# Patient Record
Sex: Male | Born: 1955 | Race: White | Hispanic: No | Marital: Married | Smoking: Former smoker
Health system: Southern US, Community
[De-identification: ages and names within clinical notes are randomized; demographics above are authoritative.]

## PROBLEM LIST (undated history)

## (undated) DIAGNOSIS — M109 Gout, unspecified: Secondary | ICD-10-CM

## (undated) DIAGNOSIS — K429 Umbilical hernia without obstruction or gangrene: Secondary | ICD-10-CM

## (undated) DIAGNOSIS — E785 Hyperlipidemia, unspecified: Secondary | ICD-10-CM

## (undated) DIAGNOSIS — G039 Meningitis, unspecified: Secondary | ICD-10-CM

## (undated) DIAGNOSIS — D689 Coagulation defect, unspecified: Secondary | ICD-10-CM

## (undated) DIAGNOSIS — M1990 Unspecified osteoarthritis, unspecified site: Secondary | ICD-10-CM

## (undated) DIAGNOSIS — I2699 Other pulmonary embolism without acute cor pulmonale: Secondary | ICD-10-CM

## (undated) DIAGNOSIS — G5600 Carpal tunnel syndrome, unspecified upper limb: Secondary | ICD-10-CM

## (undated) DIAGNOSIS — E039 Hypothyroidism, unspecified: Secondary | ICD-10-CM

## (undated) DIAGNOSIS — K5790 Diverticulosis of intestine, part unspecified, without perforation or abscess without bleeding: Secondary | ICD-10-CM

## (undated) HISTORY — PX: BACK SURGERY: SHX140

## (undated) HISTORY — DX: Coagulation defect, unspecified: D68.9

## (undated) HISTORY — PX: COLONOSCOPY: SHX174

## (undated) HISTORY — PX: KNEE ARTHROSCOPY: SHX127

## (undated) HISTORY — DX: Carpal tunnel syndrome, unspecified upper limb: G56.00

---
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
## 2002-09-04 ENCOUNTER — Encounter: Payer: Self-pay | Admitting: Emergency Medicine

## 2002-09-04 ENCOUNTER — Inpatient Hospital Stay (HOSPITAL_COMMUNITY): Admission: EM | Admit: 2002-09-04 | Discharge: 2002-09-09 | Payer: Self-pay | Admitting: Emergency Medicine

## 2003-09-12 DIAGNOSIS — I2699 Other pulmonary embolism without acute cor pulmonale: Secondary | ICD-10-CM

## 2003-09-12 HISTORY — DX: Other pulmonary embolism without acute cor pulmonale: I26.99

## 2003-12-03 ENCOUNTER — Emergency Department (HOSPITAL_COMMUNITY): Admission: EM | Admit: 2003-12-03 | Discharge: 2003-12-04 | Payer: Self-pay | Admitting: Emergency Medicine

## 2004-09-11 HISTORY — PX: WRIST ARTHROSCOPY: SHX838

## 2005-02-09 ENCOUNTER — Ambulatory Visit (HOSPITAL_COMMUNITY): Admission: RE | Admit: 2005-02-09 | Discharge: 2005-02-09 | Payer: Self-pay | Admitting: Orthopedic Surgery

## 2005-02-09 ENCOUNTER — Ambulatory Visit (HOSPITAL_BASED_OUTPATIENT_CLINIC_OR_DEPARTMENT_OTHER): Admission: RE | Admit: 2005-02-09 | Discharge: 2005-02-09 | Payer: Self-pay | Admitting: Orthopedic Surgery

## 2005-09-11 HISTORY — PX: TOTAL KNEE ARTHROPLASTY: SHX125

## 2005-11-21 ENCOUNTER — Encounter: Admission: RE | Admit: 2005-11-21 | Discharge: 2005-11-21 | Payer: Self-pay | Admitting: Internal Medicine

## 2007-07-29 ENCOUNTER — Ambulatory Visit: Payer: Self-pay | Admitting: Internal Medicine

## 2007-08-06 ENCOUNTER — Ambulatory Visit: Payer: Self-pay | Admitting: Internal Medicine

## 2007-08-06 ENCOUNTER — Encounter: Payer: Self-pay | Admitting: Internal Medicine

## 2007-10-25 DIAGNOSIS — R197 Diarrhea, unspecified: Secondary | ICD-10-CM

## 2007-10-25 DIAGNOSIS — M109 Gout, unspecified: Secondary | ICD-10-CM

## 2007-10-25 DIAGNOSIS — Z8739 Personal history of other diseases of the musculoskeletal system and connective tissue: Secondary | ICD-10-CM | POA: Insufficient documentation

## 2007-10-25 DIAGNOSIS — Z86711 Personal history of pulmonary embolism: Secondary | ICD-10-CM

## 2009-02-10 ENCOUNTER — Ambulatory Visit (HOSPITAL_COMMUNITY): Admission: RE | Admit: 2009-02-10 | Discharge: 2009-02-10 | Payer: Self-pay | Admitting: Cardiovascular Disease

## 2009-09-11 HISTORY — PX: TOTAL KNEE REVISION: SHX996

## 2009-09-27 ENCOUNTER — Encounter (INDEPENDENT_AMBULATORY_CARE_PROVIDER_SITE_OTHER): Payer: Self-pay | Admitting: Orthopedic Surgery

## 2009-09-27 ENCOUNTER — Inpatient Hospital Stay (HOSPITAL_COMMUNITY): Admission: RE | Admit: 2009-09-27 | Discharge: 2009-10-01 | Payer: Self-pay | Admitting: Orthopedic Surgery

## 2009-11-26 ENCOUNTER — Encounter (INDEPENDENT_AMBULATORY_CARE_PROVIDER_SITE_OTHER): Payer: Self-pay | Admitting: Orthopedic Surgery

## 2009-11-26 ENCOUNTER — Inpatient Hospital Stay (HOSPITAL_COMMUNITY): Admission: AD | Admit: 2009-11-26 | Discharge: 2009-11-29 | Payer: Self-pay | Admitting: Orthopedic Surgery

## 2010-01-27 ENCOUNTER — Encounter: Admission: RE | Admit: 2010-01-27 | Discharge: 2010-04-12 | Payer: Self-pay | Admitting: Orthopedic Surgery

## 2010-09-11 HISTORY — PX: TOTAL HIP ARTHROPLASTY: SHX124

## 2010-11-27 LAB — TISSUE CULTURE
Culture: NO GROWTH
Gram Stain: NONE SEEN

## 2010-11-27 LAB — URINALYSIS, ROUTINE W REFLEX MICROSCOPIC
Bilirubin Urine: NEGATIVE
Glucose, UA: NEGATIVE mg/dL
Ketones, ur: NEGATIVE mg/dL
Leukocytes, UA: NEGATIVE
Protein, ur: NEGATIVE mg/dL
pH: 5 (ref 5.0–8.0)

## 2010-11-27 LAB — BASIC METABOLIC PANEL
BUN: 9 mg/dL (ref 6–23)
Calcium: 8.4 mg/dL (ref 8.4–10.5)
Calcium: 8.6 mg/dL (ref 8.4–10.5)
Creatinine, Ser: 1.11 mg/dL (ref 0.4–1.5)
GFR calc non Af Amer: >60 mL/min (ref >60)
GFR calc non Af Amer: >60 mL/min (ref >60)
Glucose, Bld: 147 mg/dL — ABNORMAL HIGH (ref 70–99)
Sodium: 135 mEq/L (ref 135–145)

## 2010-11-27 LAB — CBC
HCT: 42.2 % (ref 39.0–52.0)
Hemoglobin: 14.3 g/dL (ref 13.0–17.0)
Hemoglobin: 11.3 g/dL — ABNORMAL LOW (ref 13.0–17.0)
Hemoglobin: 12.4 g/dL — ABNORMAL LOW (ref 13.0–17.0)
MCHC: 33.8 g/dL (ref 30.0–36.0)
MCHC: 33.7 g/dL (ref 30.0–36.0)
MCHC: 33.8 g/dL (ref 30.0–36.0)
MCV: 84 fL (ref 78.0–100.0)
Platelets: 177 10*3/uL (ref 150–400)
Platelets: 183 10*3/uL (ref 150–400)
RBC: 5.08 MIL/uL (ref 4.22–5.81)
RDW: 14.1 % (ref 11.5–15.5)
RDW: 14.4 % (ref 11.5–15.5)
WBC: 8.7 10*3/uL (ref 4.0–10.5)

## 2010-11-27 LAB — PROTIME-INR
INR: 1.82 — ABNORMAL HIGH (ref 0.00–1.49)
INR: 1.02 (ref 0.00–1.49)
INR: 2.3 — ABNORMAL HIGH (ref 0.00–1.49)
INR: 2.63 — ABNORMAL HIGH (ref 0.00–1.49)
Prothrombin Time: 20.9 seconds — ABNORMAL HIGH (ref 11.6–15.2)
Prothrombin Time: 14.2 seconds (ref 11.6–15.2)
Prothrombin Time: 25.1 seconds — ABNORMAL HIGH (ref 11.6–15.2)

## 2010-11-27 LAB — APTT: aPTT: 26 seconds (ref 24–37)

## 2010-11-27 LAB — COMPREHENSIVE METABOLIC PANEL
ALT: 22 U/L (ref 0–53)
Alkaline Phosphatase: 114 U/L (ref 39–117)
BUN: 11 mg/dL (ref 6–23)
CO2: 28 mEq/L (ref 19–32)
Calcium: 9.4 mg/dL (ref 8.4–10.5)
GFR calc non Af Amer: >60 mL/min (ref >60)
Glucose, Bld: 96 mg/dL (ref 70–99)
Sodium: 142 mEq/L (ref 135–145)

## 2010-11-27 LAB — VANCOMYCIN, TROUGH: Vancomycin Tr: 13.8 ug/mL (ref 10.0–20.0)

## 2010-11-27 LAB — TYPE AND SCREEN: ABO/RH(D): O POS

## 2010-11-27 LAB — ANAEROBIC CULTURE

## 2010-11-27 LAB — BODY FLUID CULTURE

## 2010-11-27 LAB — GRAM STAIN

## 2010-12-01 ENCOUNTER — Other Ambulatory Visit (HOSPITAL_COMMUNITY): Payer: Self-pay | Admitting: Neurosurgery

## 2010-12-01 DIAGNOSIS — M545 Low back pain: Secondary | ICD-10-CM

## 2010-12-04 LAB — ANAEROBIC CULTURE: Gram Stain: NONE SEEN

## 2010-12-04 LAB — COMPREHENSIVE METABOLIC PANEL
ALT: 57 U/L — ABNORMAL HIGH (ref 0–53)
AST: 25 U/L (ref 0–37)
Albumin: 4.7 g/dL (ref 3.5–5.2)
Alkaline Phosphatase: 106 U/L (ref 39–117)
BUN: 14 mg/dL (ref 6–23)
CO2: 27 mEq/L (ref 19–32)
Calcium: 9.4 mg/dL (ref 8.4–10.5)
Chloride: 106 mEq/L (ref 96–112)
Creatinine, Ser: 0.99 mg/dL (ref 0.4–1.5)
GFR calc Af Amer: >60 mL/min (ref >60)
GFR calc non Af Amer: >60 mL/min (ref >60)
Glucose, Bld: 98 mg/dL (ref 70–99)
Potassium: 4.2 mEq/L (ref 3.5–5.1)
Sodium: 140 mEq/L (ref 135–145)
Total Bilirubin: 0.5 mg/dL (ref 0.3–1.2)
Total Protein: 8 g/dL (ref 6.0–8.3)

## 2010-12-04 LAB — PROTIME-INR
INR: 1.01 (ref 0.00–1.49)
INR: 1.16 (ref 0.00–1.49)
INR: 1.33 (ref 0.00–1.49)
INR: 1.91 — ABNORMAL HIGH (ref 0.00–1.49)
Prothrombin Time: 13.2 seconds (ref 11.6–15.2)
Prothrombin Time: 14.7 seconds (ref 11.6–15.2)
Prothrombin Time: 21.7 seconds — ABNORMAL HIGH (ref 11.6–15.2)

## 2010-12-04 LAB — CBC
HCT: 29.2 % — ABNORMAL LOW (ref 39.0–52.0)
HCT: 31.7 % — ABNORMAL LOW (ref 39.0–52.0)
HCT: 45.2 % (ref 39.0–52.0)
Hemoglobin: 10.5 g/dL — ABNORMAL LOW (ref 13.0–17.0)
Hemoglobin: 14.9 g/dL (ref 13.0–17.0)
Hemoglobin: 9.7 g/dL — ABNORMAL LOW (ref 13.0–17.0)
MCHC: 32.9 g/dL (ref 30.0–36.0)
MCHC: 33.1 g/dL (ref 30.0–36.0)
MCHC: 33.3 g/dL (ref 30.0–36.0)
MCHC: 33.7 g/dL (ref 30.0–36.0)
MCV: 86.1 fL (ref 78.0–100.0)
MCV: 86.3 fL (ref 78.0–100.0)
MCV: 86.7 fL (ref 78.0–100.0)
MCV: 87 fL (ref 78.0–100.0)
Platelets: 146 10*3/uL — ABNORMAL LOW (ref 150–400)
Platelets: 164 10*3/uL (ref 150–400)
Platelets: 222 10*3/uL (ref 150–400)
RBC: 3.64 MIL/uL — ABNORMAL LOW (ref 4.22–5.81)
RBC: 5.24 MIL/uL (ref 4.22–5.81)
RDW: 16.3 % — ABNORMAL HIGH (ref 11.5–15.5)
RDW: 16.4 % — ABNORMAL HIGH (ref 11.5–15.5)
RDW: 17 % — ABNORMAL HIGH (ref 11.5–15.5)
WBC: 8.2 10*3/uL (ref 4.0–10.5)

## 2010-12-04 LAB — URINALYSIS, ROUTINE W REFLEX MICROSCOPIC
Ketones, ur: NEGATIVE mg/dL
Nitrite: NEGATIVE
Specific Gravity, Urine: 1.021 (ref 1.005–1.030)
pH: 5 (ref 5.0–8.0)

## 2010-12-04 LAB — WOUND CULTURE
Culture: NO GROWTH
Gram Stain: NONE SEEN

## 2010-12-04 LAB — APTT
aPTT: 30 seconds (ref 24–37)
aPTT: 39 seconds — ABNORMAL HIGH (ref 24–37)

## 2010-12-04 LAB — GRAM STAIN: Gram Stain: NONE SEEN

## 2010-12-04 LAB — BASIC METABOLIC PANEL
BUN: 9 mg/dL (ref 6–23)
CO2: 28 mEq/L (ref 19–32)
CO2: 29 mEq/L (ref 19–32)
Calcium: 8.6 mg/dL (ref 8.4–10.5)
Chloride: 106 mEq/L (ref 96–112)
Chloride: 106 mEq/L (ref 96–112)
Creatinine, Ser: 1.02 mg/dL (ref 0.4–1.5)
Glucose, Bld: 107 mg/dL — ABNORMAL HIGH (ref 70–99)
Sodium: 139 mEq/L (ref 135–145)

## 2010-12-04 LAB — TYPE AND SCREEN: Antibody Screen: NEGATIVE

## 2010-12-04 LAB — GLUCOSE, CAPILLARY: Glucose-Capillary: 74 mg/dL (ref 70–99)

## 2010-12-06 ENCOUNTER — Ambulatory Visit (HOSPITAL_COMMUNITY)
Admission: RE | Admit: 2010-12-06 | Discharge: 2010-12-06 | Disposition: A | Discharge status: Home / Self Care | Payer: Commercial Managed Care - PPO | Source: Ambulatory Visit | Attending: Neurosurgery | Admitting: Neurosurgery

## 2010-12-06 DIAGNOSIS — M5137 Other intervertebral disc degeneration, lumbosacral region: Secondary | ICD-10-CM | POA: Insufficient documentation

## 2010-12-06 DIAGNOSIS — M7130 Other bursal cyst, unspecified site: Secondary | ICD-10-CM | POA: Insufficient documentation

## 2010-12-06 DIAGNOSIS — M51379 Other intervertebral disc degeneration, lumbosacral region without mention of lumbar back pain or lower extremity pain: Secondary | ICD-10-CM | POA: Insufficient documentation

## 2010-12-06 DIAGNOSIS — M545 Low back pain: Secondary | ICD-10-CM

## 2011-01-12 ENCOUNTER — Encounter (HOSPITAL_COMMUNITY)
Admission: RE | Admit: 2011-01-12 | Discharge: 2011-01-12 | Disposition: A | Discharge status: Home / Self Care | Payer: 59 | Source: Ambulatory Visit | Attending: Neurosurgery | Admitting: Neurosurgery

## 2011-01-12 LAB — CBC
HCT: 44 % (ref 39.0–52.0)
MCH: 29 pg (ref 26.0–34.0)
MCHC: 34.3 g/dL (ref 30.0–36.0)
MCV: 84.5 fL (ref 78.0–100.0)
Platelets: 217 10*3/uL (ref 150–400)
RDW: 13.9 % (ref 11.5–15.5)

## 2011-01-19 ENCOUNTER — Inpatient Hospital Stay (HOSPITAL_COMMUNITY): Payer: 59

## 2011-01-19 ENCOUNTER — Observation Stay (HOSPITAL_COMMUNITY)
Admission: RE | Admit: 2011-01-19 | Discharge: 2011-01-19 | Disposition: A | Discharge status: Home / Self Care | Payer: 59 | Source: Ambulatory Visit | Attending: Neurosurgery | Admitting: Neurosurgery

## 2011-01-19 DIAGNOSIS — M7130 Other bursal cyst, unspecified site: Secondary | ICD-10-CM | POA: Insufficient documentation

## 2011-01-19 DIAGNOSIS — Q762 Congenital spondylolisthesis: Principal | ICD-10-CM | POA: Insufficient documentation

## 2011-01-19 DIAGNOSIS — Z01812 Encounter for preprocedural laboratory examination: Secondary | ICD-10-CM | POA: Insufficient documentation

## 2011-01-19 DIAGNOSIS — M47817 Spondylosis without myelopathy or radiculopathy, lumbosacral region: Secondary | ICD-10-CM | POA: Insufficient documentation

## 2011-01-24 NOTE — Assessment & Plan Note (Signed)
[facility]                         GASTROENTEROLOGY OFFICE NOTE   NAME:Jason Norton, Jason Norton                        MRN:          [id number]  DATE:07/29/2007                            DOB:          February 20, 1956    REFERRING PHYSICIAN:  Gaspar Garbe, M.D.   REASON FOR CONSULTATION:  Screening colonoscopy in a 55 year old  gentleman on chronic Coumadin therapy.   HISTORY:  This is a 55 year old white male with a history of deep vein  thrombosis and pulmonary embolus for which he has been on Coumadin for  the past 4-1/2 years.  He also has a history of osteoarthritis and  underwent knee replacement surgery last year.  He presents now regarding  routine screening colonoscopy.  The patient denies any problems with  abdominal pain, nausea, vomiting, change in bowel habits, or bleeding.  He does state that he tends to have intermittent diarrhea chronically.  He cannot identify any exacerbating factors. His weight has been stable.  No prior history of GI problems or endoscopic evaluations.   PAST MEDICAL HISTORY:  1. 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  2. History of gout.  3. Degenerative arthritis status post right knee replacement in 2007.   ALLERGIES:  No known drug allergies.   CURRENT MEDICATIONS:  1. Allopurinol 100 mg daily.  2. Colchicine 0.6 mg daily.  3. Coumadin 5 mg daily.  4. Crestor 5 mg daily.  5. Propecia 1 mg daily.  6. Celebrex 200 mg daily.  7. Ambien 10 mg daily.   FAMILY HISTORY:  Unknown as patient is adopted.   SOCIAL HISTORY:  The patient is single without children.  He has a  bachelor's degree, works as a Curator for [employer].  He smokes a pipe, occasionally uses alcohol.   REVIEW OF SYSTEMS:  Per diagnostic evaluation form.   PHYSICAL EXAMINATION:  GENERAL: Well-appearing male in no acute  distress.  VITAL  SIGNS:  Blood pressure 120/82, heart rate 68, weight 197.6 pounds.  He is 5 feet 10 inches in height.  HEENT:  Sclerae anicteric.  Conjunctivae pink.  Oral mucosa intact, no  adenopathy.  LUNGS:  Clear.  HEART:  Regular.  ABDOMEN:  Soft without tenderness, mass, or hernia.  EXTREMITIES:  Without edema.   IMPRESSION:  This is a 55 year old gentleman who presents regarding  screening colonoscopy.  GI Review of Systems remarkable only for  chronic, stable, intermittent diarrhea. He is an appropriate candidate  without contraindication.  I discussed with him in detail the nature of  colonoscopy as well as its risks, benefits, and alternatives.  He  understood and agreed to proceed.  In terms  of management of his  anticoagulation, he has an appointment this week to see clinical  pharmacist Ronal Fear at Dr. Deneen Harts office regarding management of  his Coumadin periprocedurally (as was done for his previously knee  surgery).  We might anticipate that he will be off Coumadin with a  Lovenox bridge.  We will leave  this to Ms. Miller's discretion.  His  colonoscopy is tentatively planned for August 06, 2007, at 8 a.m.     Wilhemina Bonito. Marina Goodell, MD  Electronically Signed    JNP/MedQ  DD: 07/29/2007  DT: 07/29/2007  Job #: [id number]   cc:   Gaspar Garbe, M.D.

## 2011-01-24 NOTE — Op Note (Signed)
NAMEDEVONTRE, Jason Norton                 ACCOUNT NO.:  [id number]  MEDICAL RECORD NO.:  [id number]           PATIENT TYPE:  O  LOCATION:  3536                         FACILITY:  [facility]  PHYSICIAN:  Danae Orleans. Venetia Maxon, M.D.  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  PROCEDURES: 1. Right L4-5 laminectomy with resection of synovial cyst. 2. Microdissection.  SURGEON:  Danae Orleans. Venetia Maxon, MD  ASSISTANT:  Georgiann Cocker, RN and Clydene Fake, MD  ANESTHESIA:  General endotracheal anesthesia.  ESTIMATED BLOOD LOSS:  Minimal.  COMPLICATIONS:  None.  DISPOSITION:  Recovery.  INDICATIONS:  Jason Norton is a 54 year old man with mobile spondylolisthesis of L4 and L5 with a right-sided synovial cyst causing right L5 radiculopathy.  The patient elected to undergo resection of synovial cyst understanding that he may need a later fusion if he developed progressive instability.  PROCEDURE:  Jason Norton was brought to the operating room.  Following satisfactory and uncomplicated induction of general endotracheal anesthesia and placement of intravenous lines, the patient was placed in prone position on the Wilson frame.  His low back was prepped and draped in the usual sterile fashion.  The area of planned incision was infiltrated with local lidocaine.  Incision was made in the midline, carried to the lumbodorsal fascia, which was incised sharply on the right side of midline.  Subperiosteal dissection was performed exposing the L4-5 interspace.  Intraoperative x-ray demonstrated marker probes at the L4-5 and L5-S1 levels.  A hemi-semi-laminectomy of  L4 was performed with high-speed drill and completed with Kerrison rongeurs.  There were significant amount of degenerative changes in the facet joint.  The care was taken to preserve as much of the facet as possible.  A foraminotomy was performed overlying the L5 nerve root.  The hypertrophied ligamentum flavum was detached and removed in piecemeal fashion.  Under microscopic visualization, the synovial cyst was identified, this was causing significant L5 nerve root compression.  The synovial cyst was mobilized with microdissection and then removed in piecemeal fashion and the lateral dura was decompressed, the L5 nerve root was thoroughly decompressed and probe could easily be inserted.  Upon removal of the remainder of the cyst, probe could easily inserted along the course of the L4 nerve root.  Angled curettes were used to remove the lateral base of the cyst.  Hemostasis was assured.  The wound was irrigated with bacitracin saline, bathed in Depo-Medrol and fentanyl.  The self- retaining tractor was removed.  The lumbodorsal fascia was closed with 0 Vicryl sutures, subcutaneous tissues were reapproximated with 2-0 Vicryl and interrupted inverted sutures.  Skin edges were reapproximated with 3- 0 Vicryl subcuticular stitch.  The wound was dressed with Dermabond. The patient was extubated in the operating room and taken to the recovery in stable satisfactory having tolerated the  operation well. Counts were correct at the end of the case.     Danae Orleans. Venetia Maxon, M.D.     JDS/MEDQ  D:  01/19/2011  T:  01/19/2011  Job:  [id number]  Electronically Signed by Maeola Harman M.D. on 01/24/2011 07:44:25 AM

## 2011-01-27 NOTE — Op Note (Signed)
NAMEWISTER, HOEFLE                 ACCOUNT NO.:  [id number]   MEDICAL RECORD NO.:  [id number]          PATIENT TYPE:  AMB   LOCATION:  DSC                          FACILITY:  [facility]   PHYSICIAN:  Cindee Salt, M.D.       DATE OF BIRTH:  1956-08-22   DATE OF PROCEDURE:  02/09/2005  DATE OF DISCHARGE:                                 OPERATIVE REPORT   PREOPERATIVE DIAGNOSIS:  Gout, right wrist.   POSTOPERATIVE DIAGNOSIS:  Gout, right wrist.   OPERATION:  Arthroscopy, debridement and partial synovectomy of gout, right  wrist.   SURGEON:  Cindee Salt, M.D.   Threasa HeadsCarolyne Fiscal.   ANESTHESIA:  General.   HISTORY:  The patient is a 55 year old male with a history of gout. He has  had swelling of his wrist that has not responded to conservative treatment.  He is admitted for arthroscopy and debridement.   PROCEDURE:  The patient is brought to the operating room where a general  anesthetic was carried out without difficulty.  He was prepped using  DuraPrep in the supine position, right arm free. The limb was placed in the  arthroscopy tower and 10 pounds traction applied. The joint was aspirated;  this was sent for culture.  The joint was then inflated through the 3/4  portal. The scope was entered after making transverse incision through skin  only;  deployed a hemostat. A blunt trocar was used to enter the joint;  joint was inspected. Significant crystallization was present in the synovial  tissue. Am irrigation catheter was placed, and a small scapholunate ligament  tear was present. The 4/5 portal was opened, after localization with a 22  gauge needle; very significant synovitis was present across the entire  joint. The joint was then copiously irrigated with saline using a pump.  The  midcarpal joint was then inspected, after localization inflation with  saline. The mid radial portal was then entered.  The joint inspected; again,  a significant synovitis was present with tophaceous  crystals being present.  These were found to be extruding from the joint portals.  The area was then  copiously irrigated. The ulnar portal opened, after localization with 22  gauge needle. A separate portal was then placed for irrigation.  A shaver  and ArthroWand were then used perform a synovectomy at the mid carpal joint.  The proximal carpal joint was then inspected through both 4/5 portal, 3/4  portal; lunotriquetral joint ligament was intact. TFCC was intact. A  thorough debridement with the full-radius shaver and ArthroWand was then  performed to mid carpal joint, maintaining excellent flow.  The joint was  then irrigated.  The portals closed with interrupted 5-0 nylon sutures.  Sterile compressive dressing and wrist splint applied. The  patient tolerated the procedure well and was taken to the recovery room for  observation in satisfactory condition. He is discharged home, to return to  the [facility] in one week;  on Vicodin.  He will resume his  Coumadin. He has been on Coumadin with his INR being 1.5.  GK/MEDQ  D:  02/09/2005  T:  02/09/2005  Job:  [id number]

## 2011-01-27 NOTE — H&P (Signed)
NAME:  Jason Norton, WELLES NO.:  [id number]   MEDICAL RECORD NO.:  [id number]                   PATIENT TYPE:  INP   LOCATION:  0101                                 FACILITY:  [facility]   PHYSICIAN:  Gwen Pounds, M.D.                 DATE OF BIRTH:  1956/06/18   DATE OF ADMISSION:  09/04/2002  DATE OF DISCHARGE:                                HISTORY & PHYSICAL   CHIEF COMPLAINT:  Pulmonary emboli and chest pain.   HISTORY OF PRESENT ILLNESS:  The patient is a 55 year old white male,  otherwise healthy, who is here with his wife, visiting his mother for  Christmas from [place], [place]., where he just moved from [place]. He  presents with a four day history of chest tightness radiating to the back.  There is a pleuritic component noted. He also has a cough which is dry. No  hemoptysis. He has minor shortness of breath. He travels a lot, about 1,000  miles per week in the car. He is in an airplane about one to two times per  month. Diagnosed with pulmonary embolism in the emergency room. He is  otherwise comfortable right now. Heparin drip is on order. I was called for  admission.   PAST MEDICAL HISTORY:  Right knee reconstruction and three arthroscopic knee  surgeries in the last 2-3 years, the last one being 2-3 years ago. Also has  history of diverticulosis based on colonoscopy approximately five years ago.  He has a history of negative cardiac workup approximately seven years ago  secondary to atypical chest pain. Meningitis at nine weeks old and history  of gout.   ALLERGIES:  No known drug allergies.   MEDICATIONS:  1. Celebrex 200 mg twice a day.  2. Occasional aspirin.  3. Occasional NSAID.   SOCIAL HISTORY:  He moved to [place], [place]. from [place]. He works for  [employer] and [employer]. He is married. No children. He  smokes cigars, about three per day. Occasional alcohol use. No drugs.   FAMILY HISTORY:  He is  adopted.   REVIEW OF SYSTEMS:  He has epigastric discomfort without burning times two  weeks. He has gastroesophageal reflux disease type of symptoms. He has  occasional diarrhea with diverticulosis. Otherwise, no bowel or bladder  issues. He does not exercise. He has not had any recent weight loss,  unexplained fever, or chills. No major changes in his overall health. He  wears glasses. Otherwise, no current ear, nose, or throat type of symptoms.  No lower extremity issues except for his right knee. All systems were  reviewed and are negative.   PHYSICAL EXAMINATION:  VITAL SIGNS: Blood pressure 152/97, pulse stable at  80. He is 5'10 tall. He weighs 210 pounds. His respiratory rate is 20 and  he is 98% on room air.  GENERAL: Awake, alert, and oriented times three.  THROAT:  Negative.  HEENT: Pupils are equal, round, and reactive to light and accommodation.  Extraocular muscles intact. Oropharynx moist.  NECK: No jugular venous distention.. No lymphadenopathy.  PULMONARY: Clear to auscultation bilaterally.  CARDIAC: Regular rate and rhythm.  ABDOMEN: Soft and obese.  EXTREMITIES: No clubbing, cyanosis, or edema. No cords are noted. Right knee  with scars from surgical incision.  RECTAL: Heme negative. No nodules on the prostate.   LABORATORY DATA:  WBC count 6.6, hemoglobin 14.7, platelet count 233,  differential is within normal limits. B-met within normal limits except for  glucose of 148. Liver function studies are normal. CK is 79, troponin I  0.02.   DIAGNOSTIC IMPRESSION:  Chest x-ray is normal. Chest CT revealed left lower  lobe pulmonary embolism.   ASSESSMENT:  This is a 55 year old male otherwise healthy. He is with  pulmonary embolism but is hemodynamically stable.   PLAN:  1. Admit.  2. Heparin drip.  3. Coumadin. Start tomorrow.  4. Thrombophilia screen.  5. Oxygen.  6. Protonix for gastroesophageal reflux disease symptoms.  7. PSA but there is no obvious  malignancies noted.  8. Talk with the patient.  9. Doppler to rule out deep vein thrombosis.  10.      Likely etiology is obesity and immobility.  11.      Will check fasting sugar tomorrow. I believe that the hyperglycemia     is reactive.                                               Gwen Pounds, M.D.    JMR/MEDQ  D:  09/04/2002  T:  09/04/2002  Job:  [id number]

## 2011-01-27 NOTE — Discharge Summary (Signed)
NAME:  Jason Norton, Jason Norton NO.:  [id number]   MEDICAL RECORD NO.:  [id number]                   PATIENT TYPE:  INP   LOCATION:  5638                                 FACILITY:  [facility]   PHYSICIAN:  Gaspar Garbe, M.D.            DATE OF BIRTH:  14-Jan-1956   DATE OF ADMISSION:  09/04/2002  DATE OF DISCHARGE:  09/09/2002                                 DISCHARGE SUMMARY   DIAGNOSES:  1. Pulmonary embolus.  2. Deep venous thrombosis.  3. Gout flare.  4. Impaired glucose tolerance.   SUMMARY HOSPITAL COURSE:  The patient was admitted on September 04, 2002 with  diagnosis of pulmonary embolus.  He was started on IV heparin, which was  monitored through pharmacy protocol, and gradually weaned off of his oxygen  requirement.  He continued to do reasonably well and was started on Coumadin  per pharmacy protocol as well.  He initially had a slight cough and was  given Hytuss AC cough syrup for this but this was discontinued prior to the  time of his discharge.  The patient underwent screening for causes of DVT  including protein C, protein S, lupus anticoagulant and antithrombin III,  all of which were negative.  The patient has a factor V Leiden which is  currently pending.  A prothrombin mutation was supposed to have been ordered  but was not drawn and it will be required that he complete this in order to  have his hypercoagulable workup completed; this will be done by Dr. Gray Bernhardt  in [place], [place].  Once again, his lupus anticoagulant result  is negative, however, his factor V Leiden is still pending.  The patient  developed a gouty attack on the late evening of September 07, 2002 and he was  treated initially with one dose of Vioxx as well as Tylenol and colchicine.  He was continuing to have a slight flare on the morning of his discharge,  but had only taken three colchicines the prior day.  Steroids were offered  to the patient as an  outpatient but he refused at that point.   The patient was followed for his INR following his Coumadin start.  He was  initially started with 5 mg a day, 10 mg on day 2, 12.5 mg on day 3, and  then 7.5 mg on day 4 per pharmacy protocol.  His INR had gone from 1.9 on  September 08, 2002 to 2.9 on September 09, 2002.  The patient was then  therefore sent home on 5 mg of Coumadin, with followup to be drawn on  Friday, September 12, 2002, at Dr. Philpot's office for continued management.   It was also noted that the patient was having occasional elevated glucoses,  non-fasting, and an A1c was drawn, which was found to be 5.5, indicating  that the patient likely had impaired glucose tolerance.  He was seen by  dietary and diet and exercise were discussed with the patient as well.   MEDICATIONS:  1. Coumadin 5 mg p.o. every day, with INR to be drawn on September 12, 2002 by     Dr. Gray Bernhardt [place].  2. Colchicine 0.6 mg one tablet every two hours as needed for gout pain.     The patient may discontinue if develops diarrhea.   LABORATORY DATA ON DAY OF DISCHARGE:  White count 7.3, hemoglobin 14.3,  hematocrit 41.0, platelets 194,000.  PT of 26.2, INR of 2.9.  His PTT is 125  on IV heparin.  He also had a PSA done at the time of his admission which  was 0.78.  His protein C level was 78, protein S 67, lupus anticoagulant was  negative, antithrombin III 111 and as started prior, factor V Leiden is  currently pending.   CT done at the time of admission showed a filling defect through the  proximal left lower lobe pulmonary artery consistent with pulmonary embolus.   Lower extremity CAT scan showed a filling defect in the lower popliteal vein  suggestive of deep venous thrombosis of his left leg.   The patient also had negative cardiac enzymes at time of admission and he  was seen on telemetry during this time.  His admission glucose was 148.   FOLLOWUP:  The patient is to  follow up with Dr. Gray Bernhardt at an appointment  that he had scheduled for a regular physical in [place] on January 2nd  for continue anticoagulation monitoring and management.  In addition, I  would suggest that a prothrombin mutation 20210 be sent by Dr. Gray Bernhardt to  complete his hypercoagulable workup and that he call the laboratory here at  [facility], which is area code [phone], for results of the patient's  factor V Leiden, which is currently pending at this time, to help make  decision as to whether he will need long-term anticoagulation or lifetime  anticoagulation, based on these results.   CONDITION ON DISCHARGE:  Stable.                                               Gaspar Garbe, M.D.    RWT/MEDQ  D:  09/09/2002  T:  09/09/2002  Job:  [id number]   cc:   Jinny Blossom, [place] Dr. Gray Bernhardt

## 2011-03-17 ENCOUNTER — Other Ambulatory Visit: Payer: Self-pay | Admitting: Orthopedic Surgery

## 2011-03-17 ENCOUNTER — Ambulatory Visit (HOSPITAL_COMMUNITY)
Admission: RE | Admit: 2011-03-17 | Discharge: 2011-03-17 | Disposition: A | Discharge status: Home / Self Care | Payer: 59 | Source: Ambulatory Visit | Attending: Orthopedic Surgery | Admitting: Orthopedic Surgery

## 2011-03-17 ENCOUNTER — Encounter (HOSPITAL_COMMUNITY): Payer: 59

## 2011-03-17 ENCOUNTER — Other Ambulatory Visit (HOSPITAL_COMMUNITY): Payer: Self-pay | Admitting: Orthopedic Surgery

## 2011-03-17 DIAGNOSIS — M169 Osteoarthritis of hip, unspecified: Secondary | ICD-10-CM | POA: Insufficient documentation

## 2011-03-17 DIAGNOSIS — Z01811 Encounter for preprocedural respiratory examination: Secondary | ICD-10-CM | POA: Insufficient documentation

## 2011-03-17 DIAGNOSIS — Z01812 Encounter for preprocedural laboratory examination: Secondary | ICD-10-CM | POA: Insufficient documentation

## 2011-03-17 DIAGNOSIS — Z01818 Encounter for other preprocedural examination: Secondary | ICD-10-CM | POA: Insufficient documentation

## 2011-03-17 DIAGNOSIS — Z86718 Personal history of other venous thrombosis and embolism: Secondary | ICD-10-CM | POA: Insufficient documentation

## 2011-03-17 DIAGNOSIS — F17200 Nicotine dependence, unspecified, uncomplicated: Secondary | ICD-10-CM | POA: Insufficient documentation

## 2011-03-17 DIAGNOSIS — M1610 Unilateral primary osteoarthritis, unspecified hip: Secondary | ICD-10-CM | POA: Insufficient documentation

## 2011-03-17 LAB — URINALYSIS, ROUTINE W REFLEX MICROSCOPIC
Bilirubin Urine: NEGATIVE
Ketones, ur: NEGATIVE mg/dL
Leukocytes, UA: NEGATIVE
Nitrite: NEGATIVE
Specific Gravity, Urine: 1.024 (ref 1.005–1.030)
Urobilinogen, UA: 0.2 mg/dL (ref 0.0–1.0)
pH: 6 (ref 5.0–8.0)

## 2011-03-17 LAB — COMPREHENSIVE METABOLIC PANEL
AST: 15 U/L (ref 0–37)
Albumin: 4.6 g/dL (ref 3.5–5.2)
BUN: 15 mg/dL (ref 6–23)
Chloride: 103 mEq/L (ref 96–112)
Creatinine, Ser: 0.97 mg/dL (ref 0.50–1.35)
Potassium: 4 mEq/L (ref 3.5–5.1)
Total Bilirubin: 0.6 mg/dL (ref 0.3–1.2)
Total Protein: 7.4 g/dL (ref 6.0–8.3)

## 2011-03-17 LAB — CBC
HCT: 43.2 % (ref 39.0–52.0)
Hemoglobin: 14.5 g/dL (ref 13.0–17.0)
MCH: 28.7 pg (ref 26.0–34.0)
MCHC: 33.6 g/dL (ref 30.0–36.0)
RBC: 5.06 MIL/uL (ref 4.22–5.81)

## 2011-03-17 LAB — APTT: aPTT: 46 seconds — ABNORMAL HIGH (ref 24–37)

## 2011-03-17 LAB — PROTIME-INR: INR: 1.92 — ABNORMAL HIGH (ref 0.00–1.49)

## 2011-03-29 ENCOUNTER — Inpatient Hospital Stay (HOSPITAL_COMMUNITY)
Admission: RE | Admit: 2011-03-29 | Discharge: 2011-04-03 | DRG: 470 | Disposition: A | Discharge status: Home Health | Payer: 59 | Source: Ambulatory Visit | Attending: Orthopedic Surgery | Admitting: Orthopedic Surgery

## 2011-03-29 ENCOUNTER — Inpatient Hospital Stay (HOSPITAL_COMMUNITY): Payer: 59

## 2011-03-29 DIAGNOSIS — M169 Osteoarthritis of hip, unspecified: Principal | ICD-10-CM | POA: Diagnosis present

## 2011-03-29 DIAGNOSIS — M109 Gout, unspecified: Secondary | ICD-10-CM | POA: Diagnosis not present

## 2011-03-29 DIAGNOSIS — Z01812 Encounter for preprocedural laboratory examination: Secondary | ICD-10-CM

## 2011-03-29 DIAGNOSIS — E7800 Pure hypercholesterolemia, unspecified: Secondary | ICD-10-CM | POA: Diagnosis present

## 2011-03-29 DIAGNOSIS — Z7901 Long term (current) use of anticoagulants: Secondary | ICD-10-CM

## 2011-03-29 DIAGNOSIS — M1610 Unilateral primary osteoarthritis, unspecified hip: Principal | ICD-10-CM | POA: Diagnosis present

## 2011-03-29 DIAGNOSIS — Z96659 Presence of unspecified artificial knee joint: Secondary | ICD-10-CM

## 2011-03-29 DIAGNOSIS — I2782 Chronic pulmonary embolism: Secondary | ICD-10-CM | POA: Diagnosis present

## 2011-03-29 LAB — TYPE AND SCREEN
ABO/RH(D): O POS
Antibody Screen: NEGATIVE

## 2011-03-29 LAB — PROTIME-INR
INR: 1.01 (ref 0.00–1.49)
Prothrombin Time: 13.5 seconds (ref 11.6–15.2)

## 2011-03-29 LAB — APTT: aPTT: 36 seconds (ref 24–37)

## 2011-03-30 LAB — BASIC METABOLIC PANEL
BUN: 9 mg/dL (ref 6–23)
Calcium: 8.6 mg/dL (ref 8.4–10.5)
Chloride: 101 mEq/L (ref 96–112)
Creatinine, Ser: 0.98 mg/dL (ref 0.50–1.35)
GFR calc Af Amer: >60 mL/min (ref >60)
GFR calc non Af Amer: >60 mL/min (ref >60)

## 2011-03-30 LAB — PROTIME-INR
INR: 1.13 (ref 0.00–1.49)
Prothrombin Time: 14.7 seconds (ref 11.6–15.2)

## 2011-03-30 LAB — CBC
MCH: 28.6 pg (ref 26.0–34.0)
MCHC: 32.8 g/dL (ref 30.0–36.0)
Platelets: 140 10*3/uL — ABNORMAL LOW (ref 150–400)
RDW: 13.7 % (ref 11.5–15.5)

## 2011-03-31 LAB — CBC
HCT: 31.4 % — ABNORMAL LOW (ref 39.0–52.0)
MCHC: 33.4 g/dL (ref 30.0–36.0)
Platelets: 155 10*3/uL (ref 150–400)
RDW: 13.8 % (ref 11.5–15.5)
WBC: 9 10*3/uL (ref 4.0–10.5)

## 2011-03-31 LAB — PROTIME-INR: INR: 1.24 (ref 0.00–1.49)

## 2011-03-31 LAB — BASIC METABOLIC PANEL
CO2: 27 mEq/L (ref 19–32)
Calcium: 8.6 mg/dL (ref 8.4–10.5)
Creatinine, Ser: 0.91 mg/dL (ref 0.50–1.35)
GFR calc non Af Amer: >60 mL/min (ref >60)

## 2011-03-31 NOTE — H&P (Signed)
  NAMEDEJUAN, Jason NO.:  [id number]  MEDICAL RECORD NO.:  [id number]  LOCATION:                                 FACILITY:  PHYSICIAN:  Ollen Gross, M.D.    DATE OF BIRTH:  1956/08/18  DATE OF ADMISSION:  03/29/2011 DATE OF DISCHARGE:                             HISTORY & PHYSICAL   CHIEF COMPLAINT:  Left hip pain.  HISTORY OF PRESENT ILLNESS:  The patient is a 55 year old male well- known Dr. Ollen Gross, having previously undergone right knee surgery back in January of last year.  He is unfortunately going on and had problems with his left hip.  He has known end-stage arthritis by x-rays in the office, it has been progressively getting worse.  It is felt he would benefit from undergoing surgical intervention.  Risks and benefits have been discussed and now he presents for hip arthroplasty.  ALLERGIES:  No known drug allergies.  CURRENT MEDICATIONS:  Coumadin, finasteride, allopurinol, Colcrys,Ambien, Celebrex, Vicodin, Crestor.  PAST MEDICAL HISTORY: 1. History of pulmonary embolism approximately 7 years ago. 2. History of gout. 3. Hypercholesterolemia. 4. Osteoarthritis.  PAST SURGICAL HISTORY:  Right total knee replaced in 2007, right knee resection arthroplasty secondary to infection, right knee reimplantation in 2011, back surgery in May 2012 with a cyst removal laminectomy.  FAMILY HISTORY:  Adopted.  SOCIAL HISTORY:  Married, pipe smoker, seldom intake of alcohol, has 3 steps entering his home.  REVIEW OF SYSTEMS:  GENERAL:  No fever, chills, or night sweats. NEUROLOGIC:  No seizures, syncope, or paralysis.  RESPIRATORY:  No shortness of breath, productive cough, or hemoptysis.  CARDIOVASCULAR: No chest pain, orthopnea.  GI:  No nausea, vomiting, diarrhea, or constipation.  GU:  No dysuria, hematuria, or discharge. MUSCULOSKELETAL:  Hip pain.  PHYSICAL EXAMINATION:  VITAL SIGNS:  Pulse 72, respirations 14, blood pressure  128/78. GENERAL:  A 55 year old male well nourished, well developed, no acute distress.  He is alert, oriented, cooperative, excellent historian. HEENT:  Normocephalic, atraumatic.  Pupils round and reactive.  EOMs intact. NECK:  Supple. CHEST:  Clear. HEART:  Regular rate and rhythm without murmur, S1 and S2. ABDOMEN:  Soft, nontender.  Bowel sounds present. RECTAL/BREAST/GENITALIA:  Not done, not pertinent to present illness. EXTREMITIES:  Left hip, flexion 90, 0 internal rotation, 0 external rotation, 0 degrees of abduction.  IMPRESSION:  Osteoarthritis, left hip.  PLAN:  The patient will be admitted to [facility] to undergo a left total hip replacement arthroplasty.  Surgery will be performed by Dr. Ollen Gross.  He is going to stop his Coumadin 5 days prior to surgery and he will use a Lovenox bridge preoperatively.     Jason Norton, P.A.C.   ______________________________ Ollen Gross, M.D.    ALP/MEDQ  D:  03/26/2011  T:  03/27/2011  Job:  [id number]  cc:   Gaspar Garbe, M.D. Fax: [phone]  Electronically Signed by Patrica Duel P.A.C. on 03/29/2011 10:44:36 AM Electronically Signed by Ollen Gross M.D. on 03/31/2011 06:05:57 PM

## 2011-03-31 NOTE — Op Note (Signed)
NAMECARROLL, Norton NO.:  [id number]  MEDICAL RECORD NO.:  [id number]  LOCATION:  1603                         FACILITY:  [facility]  PHYSICIAN:  Jason Norton, M.D.    DATE OF BIRTH:  Jul 09, 1956  DATE OF PROCEDURE:  03/29/2011 DATE OF DISCHARGE:                              OPERATIVE REPORT   PREOPERATIVE DIAGNOSIS:  Osteoarthritis, left hip.  POSTOPERATIVE DIAGNOSIS:  Osteoarthritis, left hip.  PROCEDURE:  Left total hip arthroplasty.  SURGEON:  Jason Norton, M.D.  ASSISTANT:  Jason Norton, P.A.C.  ANESTHESIA:  General.  ESTIMATED BLOOD LOSS:  500.[name]  DRAIN:  Hemovac x1.  COMPLICATIONS:  None.  CONDITION.:  Stable to recovery area.  BRIEF CLINICAL NOTE:  Jason Norton is a 55 year old male with advanced end- stage arthritis of the left hip with progressively worsening pain and dysfunction.  He has failed nonoperative management and presents now for left total hip arthroplasty.  PROCEDURE IN DETAIL:  After successful administration of general anesthetic, the patient was placed in the right lateral decubitus position with left side up and held with the hip positioner.  The left lower extremity was isolated from his perineum with plastic drapes and prepped and draped in usual sterile fashion.  Short posterolateral incision was made with 10 blade through subcutaneous tissue to the level of fascia lata which was incised in line with the skin incision. Sciatic nerve was palpated and protected and short rotators and capsule were isolated off the femur.  Hip was dislocated and the center of femoral head was marked.  A trial prosthesis was placed such that the center of the trial head corresponds to the center of native femoral head.  Osteotomy line was marked on the femoral neck and osteotomy made with an oscillating saw.  Femoral head was then removed.  Femoral retractors were placed to gain exposure to the proximal femur. The starter reamer was  then passed into the canal and canal was thoroughly irrigated with saline solution to remove fatty contents. Axial reaming was performed to 13.5 mm, proximal reaming to a 18D and the sleeve machined to a small.  An 18D small trial sleeve was placed.  The femur was retracted anteriorly to gain acetabular exposure. Acetabular retractors were placed and labrum and osteophytes removed. Acetabular reaming was performed up to 55 mm and a 56-mm pinnacle acetabular shell was impacted in anatomic position.  It had excellent purchase, we did not put any additional dome screws.  Apex hole eliminator was placed and 36 mm neutral +4 marathon liner was placed. The trial stem was then placed which was a 18 x 30 with a 36 +12 neck matching native anteversion.  The 36 +0 head was placed.  It reduces fairly easily, so went to +3 which had more appropriate soft tissue tension.  There was great stability with full extension, full external rotation, 70 degrees of flexion, 40 degrees of adduction, 90 degrees of internal rotation and 90 degrees of flexion and 70 degrees of internal rotation.  By placing the left leg on top of the right, it felt as though the leg lengths were equal.  The hip was then dislocated and all trials  were removed.  The permanent 18D small sleeve was placed with 18 x 13 stem and 36 +12 neck matching native anteversion.  The 36 +3 ceramic head was placed and the hip reduced with the same stability parameters.  The wound was copiously irrigated saline solution and short rotators and capsule reattached to the femur through drill holes with Ethibond suture.  Fascia lata was closed over Hemovac drain with interrupted #1 Vicryl, subcu closed #1 and #2-0 Vicryl and subcuticular running 4-0 Monocryl.  Catheter for Marcaine pain pump was placed and pump initiated.  Incisions cleaned and dried and Steri-Strips and bulky sterile dressing were applied.  He was then placed into a knee immobilizer,  awakened and transferred to recovery in stable condition.     Jason Norton, M.D.     FA/MEDQ  D:  03/29/2011  T:  03/29/2011  Job:  [id number]  Electronically Signed by Jason Norton M.D. on 03/31/2011 06:06:00 PM

## 2011-04-01 LAB — CBC
MCH: 28.4 pg (ref 26.0–34.0)
MCV: 87.5 fL (ref 78.0–100.0)
Platelets: 145 10*3/uL — ABNORMAL LOW (ref 150–400)
RDW: 13.7 % (ref 11.5–15.5)
WBC: 8.8 10*3/uL (ref 4.0–10.5)

## 2011-04-01 LAB — PROTIME-INR: Prothrombin Time: 18.4 seconds — ABNORMAL HIGH (ref 11.6–15.2)

## 2011-04-02 LAB — PROTIME-INR: Prothrombin Time: 21 seconds — ABNORMAL HIGH (ref 11.6–15.2)

## 2011-04-24 NOTE — Discharge Summary (Signed)
Jason Norton, Jason Norton NO.:  [id number]  MEDICAL RECORD NO.:  [id number]  LOCATION:  1603                         FACILITY:  [facility]  PHYSICIAN:  Ollen Gross, M.D.    DATE OF BIRTH:  June 27, 1956  DATE OF ADMISSION:  03/29/2011 DATE OF DISCHARGE:  04/03/2011                              DISCHARGE SUMMARY   ADMITTING DIAGNOSES: 1. Osteoarthritis, left hip. 2. Past history of pulmonary embolism. 3. History of gout. 4. Hypercholesterolemia. 5. Osteoarthritis.  DISCHARGE DIAGNOSES: 1. 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 2. Left knee effusion, status post bedside aspiration. 3. Past history of pulmonary embolism. 4. History of gout. 5. Hypercholesterolemia. 6. Osteoarthritis.  PROCEDURE:  On March 29, 2011, left total hip.  Surgeon, Dr. Lequita Halt. Assistant, Patrica Duel, PA-C.  Anesthesia was general.  CONSULTS:  None.  BRIEF HISTORY:  The patient is a 55 year old male with advanced arthritis of left hip, progressive worsening pain and dysfunction, failed nonoperative management and now presents for total hip arthroplasty.  LABORATORY DATA:  Admission CBC is not scanned into the chart, but serial CBCs were followed for 3 days.  Hemoglobin was down to 10.7 more stabilized.  Last done hemoglobin and hematocrit stable at 10.2 and 31.4.  PT/INR on admission 13.5 and 1.01 with a PTT of 36.  Serial prothrombin times followed per Coumadin protocol.  Last done prothrombin time INR was 24.4 and 2.15.  Chem panel on admission not scanned in the chart, but BMET's for 48 hours were all within normal limits.  Blood group type O positive.  X-RAYS:  Hip film and pelvis film shows satisfactory position of left total hip.  HOSPITAL COURSE:  The patient was admitted to [facility], taken to OR, underwent above-stated procedure without complication.  The patient tolerated procedure well, later transferred to  recovery room and then to orthopedic floor.  He was placed on a Lovenox bridge along with Coumadin due to his history of PE.  He was given p.o. and IV analgesics, 24 hours postop IV antibiotics started up with therapy, partial weightbearing 25% to 50%.  He progressed with physical therapy.  By day #2, he was doing a little bit better.  Hip was sore, but hemoglobin was stable.  Dressing changed, incision looked good.  Day #3, he continued to progress well.  This was on Saturday and not met all of his goals yet and by Sunday, however, he developed a knee effusion and the left knee had swelled up, requiring a bedside procedure by Dr. Lequita Halt.  He underwent aspiration of about 40 cc of clear fluid and then injected with lidocaine and Depo-Medrol.  Discharge was held until the following day of postop day #5.  He was doing better, the knee was improved,  the incision was healing well, hip doing well, and he was discharged home.  DISCHARGE PLAN: 1. The patient discharged home on April 03, 2011. 2. Discharge diagnoses, please see above. 3. Discharge medications, Robaxin, OxyIR, Coumadin.  Continue     allopurinol, Celebrex, Colcrys, Crestor, cyclobenzaprine, Propecia,     and Ambien. 4. Diet, heart-healthy diet. 5. Activity, partial weightbearing  25% to 50%.  Hip precautions, total     hip protocol. 6. Follow up in 2 weeks.  DISPOSITION:  Home.  CONDITION UPON DISCHARGE:  Improved.     [name], P.A.C.   ______________________________ Ollen Gross, M.D.    ALP/MEDQ  D:  04/20/2011  T:  04/21/2011  Job:  [id number]  cc:   Gaspar Garbe, M.D. Fax: [phone]  Electronically Signed by Ollen Gross M.D. on 04/24/2011 11:35:28 AM

## 2012-07-18 ENCOUNTER — Other Ambulatory Visit (HOSPITAL_COMMUNITY): Payer: Self-pay | Admitting: Neurosurgery

## 2012-07-18 DIAGNOSIS — M542 Cervicalgia: Secondary | ICD-10-CM

## 2012-07-19 ENCOUNTER — Encounter: Payer: Self-pay | Admitting: Internal Medicine

## 2012-07-21 ENCOUNTER — Ambulatory Visit (HOSPITAL_COMMUNITY)
Admission: RE | Admit: 2012-07-21 | Discharge: 2012-07-21 | Disposition: A | Discharge status: Home / Self Care | Payer: 59 | Source: Ambulatory Visit | Attending: Neurosurgery | Admitting: Neurosurgery

## 2012-07-21 DIAGNOSIS — M542 Cervicalgia: Secondary | ICD-10-CM | POA: Insufficient documentation

## 2012-07-21 DIAGNOSIS — M25519 Pain in unspecified shoulder: Secondary | ICD-10-CM | POA: Insufficient documentation

## 2012-07-21 DIAGNOSIS — M67919 Unspecified disorder of synovium and tendon, unspecified shoulder: Secondary | ICD-10-CM | POA: Insufficient documentation

## 2012-07-21 DIAGNOSIS — M719 Bursopathy, unspecified: Secondary | ICD-10-CM | POA: Insufficient documentation

## 2012-07-21 DIAGNOSIS — S46919A Strain of unspecified muscle, fascia and tendon at shoulder and upper arm level, unspecified arm, initial encounter: Secondary | ICD-10-CM | POA: Insufficient documentation

## 2012-07-21 DIAGNOSIS — X58XXXA Exposure to other specified factors, initial encounter: Secondary | ICD-10-CM | POA: Insufficient documentation

## 2012-08-23 ENCOUNTER — Encounter (HOSPITAL_BASED_OUTPATIENT_CLINIC_OR_DEPARTMENT_OTHER): Payer: Self-pay | Admitting: *Deleted

## 2012-08-23 NOTE — Progress Notes (Signed)
On coumadin for hx PE-off to come in for pt ptt-hx gout on allopurinol-will get bmet

## 2012-08-26 ENCOUNTER — Encounter (HOSPITAL_BASED_OUTPATIENT_CLINIC_OR_DEPARTMENT_OTHER)
Admission: RE | Admit: 2012-08-26 | Discharge: 2012-08-26 | Disposition: A | Discharge status: Home / Self Care | Payer: 59 | Source: Ambulatory Visit | Attending: Orthopedic Surgery | Admitting: Orthopedic Surgery

## 2012-08-26 LAB — BASIC METABOLIC PANEL
CO2: 26 mEq/L (ref 19–32)
Chloride: 102 mEq/L (ref 96–112)
Creatinine, Ser: 0.92 mg/dL (ref 0.50–1.35)
GFR calc Af Amer: >90 mL/min (ref >90)
Potassium: 4.5 mEq/L (ref 3.5–5.1)

## 2012-08-26 LAB — PROTIME-INR
INR: 1.1 (ref 0.00–1.49)
Prothrombin Time: 14.1 seconds (ref 11.6–15.2)

## 2012-08-26 LAB — APTT: aPTT: 41 seconds — ABNORMAL HIGH (ref 24–37)

## 2012-08-27 ENCOUNTER — Ambulatory Visit (HOSPITAL_BASED_OUTPATIENT_CLINIC_OR_DEPARTMENT_OTHER): Payer: 59 | Admitting: Anesthesiology

## 2012-08-27 ENCOUNTER — Encounter (HOSPITAL_BASED_OUTPATIENT_CLINIC_OR_DEPARTMENT_OTHER): Payer: Self-pay | Admitting: Anesthesiology

## 2012-08-27 ENCOUNTER — Encounter (HOSPITAL_BASED_OUTPATIENT_CLINIC_OR_DEPARTMENT_OTHER)
Admission: RE | Disposition: A | Discharge status: Home / Self Care | Payer: Self-pay | Source: Ambulatory Visit | Attending: Orthopedic Surgery

## 2012-08-27 ENCOUNTER — Encounter (HOSPITAL_BASED_OUTPATIENT_CLINIC_OR_DEPARTMENT_OTHER): Payer: Self-pay | Admitting: *Deleted

## 2012-08-27 ENCOUNTER — Ambulatory Visit (HOSPITAL_BASED_OUTPATIENT_CLINIC_OR_DEPARTMENT_OTHER)
Admission: RE | Admit: 2012-08-27 | Discharge: 2012-08-27 | Disposition: A | Discharge status: Home / Self Care | Payer: 59 | Source: Ambulatory Visit | Attending: Orthopedic Surgery | Admitting: Orthopedic Surgery

## 2012-08-27 DIAGNOSIS — Z96659 Presence of unspecified artificial knee joint: Secondary | ICD-10-CM | POA: Insufficient documentation

## 2012-08-27 DIAGNOSIS — Z86711 Personal history of pulmonary embolism: Secondary | ICD-10-CM | POA: Insufficient documentation

## 2012-08-27 DIAGNOSIS — Z96649 Presence of unspecified artificial hip joint: Secondary | ICD-10-CM | POA: Insufficient documentation

## 2012-08-27 DIAGNOSIS — Z7901 Long term (current) use of anticoagulants: Secondary | ICD-10-CM | POA: Insufficient documentation

## 2012-08-27 DIAGNOSIS — M109 Gout, unspecified: Secondary | ICD-10-CM | POA: Insufficient documentation

## 2012-08-27 DIAGNOSIS — Z01812 Encounter for preprocedural laboratory examination: Secondary | ICD-10-CM | POA: Insufficient documentation

## 2012-08-27 DIAGNOSIS — M1990 Unspecified osteoarthritis, unspecified site: Secondary | ICD-10-CM | POA: Insufficient documentation

## 2012-08-27 DIAGNOSIS — E785 Hyperlipidemia, unspecified: Secondary | ICD-10-CM | POA: Insufficient documentation

## 2012-08-27 DIAGNOSIS — G5600 Carpal tunnel syndrome, unspecified upper limb: Secondary | ICD-10-CM | POA: Insufficient documentation

## 2012-08-27 HISTORY — DX: Unspecified osteoarthritis, unspecified site: M19.90

## 2012-08-27 HISTORY — DX: Gout, unspecified: M10.9

## 2012-08-27 HISTORY — PX: STERIOD INJECTION: SHX5046

## 2012-08-27 HISTORY — DX: Hyperlipidemia, unspecified: E78.5

## 2012-08-27 HISTORY — DX: Meningitis, unspecified: G03.9

## 2012-08-27 HISTORY — PX: CARPAL TUNNEL RELEASE: SHX101

## 2012-08-27 HISTORY — DX: Other pulmonary embolism without acute cor pulmonale: I26.99

## 2012-08-27 SURGERY — CARPAL TUNNEL RELEASE
Anesthesia: General | Site: Wrist | Laterality: Right | Wound class: Clean

## 2012-08-27 MED ORDER — HYDROCODONE-ACETAMINOPHEN 5-500 MG PO TABS: 1.0000 | ORAL_TABLET | ORAL | Status: DC | PRN

## 2012-08-27 MED ORDER — BETAMETHASONE SOD PHOS & ACET 6 (3-3) MG/ML IJ SUSP
INTRAMUSCULAR | Status: DC | PRN
  Administered 2012-08-27: 1 mL

## 2012-08-27 MED ORDER — LIDOCAINE HCL (PF) 1 % IJ SOLN
INTRAMUSCULAR | Status: DC | PRN
  Administered 2012-08-27: 1 mL

## 2012-08-27 MED ORDER — LACTATED RINGERS IV SOLN
INTRAVENOUS | Status: DC
  Administered 2012-08-27 (×2): via INTRAVENOUS

## 2012-08-27 MED ORDER — OXYCODONE HCL 5 MG/5ML PO SOLN: 5.0000 mg | Freq: Once | ORAL | Status: AC | PRN

## 2012-08-27 MED ORDER — BUPIVACAINE HCL 0.25 % IJ SOLN
INTRAMUSCULAR | Status: DC | PRN
  Administered 2012-08-27: 17:00:00

## 2012-08-27 MED ORDER — OXYCODONE HCL 5 MG PO TABS
5.0000 mg | ORAL_TABLET | Freq: Once | ORAL | Status: AC | PRN
  Administered 2012-08-27: 5 mg via ORAL

## 2012-08-27 MED ORDER — ONDANSETRON HCL 4 MG/2ML IJ SOLN
INTRAMUSCULAR | Status: DC | PRN
  Administered 2012-08-27: 4 mg via INTRAVENOUS

## 2012-08-27 MED ORDER — BUPIVACAINE HCL (PF) 0.25 % IJ SOLN
INTRAMUSCULAR | Status: DC | PRN
  Administered 2012-08-27: 1 mL

## 2012-08-27 MED ORDER — MIDAZOLAM HCL 5 MG/5ML IJ SOLN
INTRAMUSCULAR | Status: DC | PRN
  Administered 2012-08-27 (×2): 1 mg via INTRAVENOUS

## 2012-08-27 MED ORDER — ONDANSETRON HCL 4 MG/2ML IJ SOLN: 4.0000 mg | Freq: Once | INTRAMUSCULAR | Status: DC | PRN

## 2012-08-27 MED ORDER — HYDROMORPHONE HCL PF 1 MG/ML IJ SOLN: 0.2500 mg | INTRAMUSCULAR | Status: DC | PRN

## 2012-08-27 MED ORDER — FENTANYL CITRATE 0.05 MG/ML IJ SOLN
INTRAMUSCULAR | Status: DC | PRN
  Administered 2012-08-27 (×2): 50 ug via INTRAVENOUS

## 2012-08-27 MED ORDER — CEFAZOLIN SODIUM-DEXTROSE 2-3 GM-% IV SOLR
INTRAVENOUS | Status: DC | PRN
  Administered 2012-08-27: 2 g via INTRAVENOUS

## 2012-08-27 SURGICAL SUPPLY — 51 items
BANDAGE CONFORM 3  STR LF (GAUZE/BANDAGES/DRESSINGS) ×3 IMPLANT
BANDAGE ELASTIC 3 VELCRO ST LF (GAUZE/BANDAGES/DRESSINGS) ×3 IMPLANT
BLADE CARPAL TUNNEL SNGL USE (BLADE) ×3 IMPLANT
BLADE SURG 15 STRL LF DISP TIS (BLADE) ×4 IMPLANT
BLADE SURG 15 STRL SS (BLADE) ×2
BNDG PLASTER X FAST 3X3 WHT LF (CAST SUPPLIES) IMPLANT
BRUSH SCRUB EZ PLAIN DRY (MISCELLANEOUS) ×3 IMPLANT
CLOTH BEACON ORANGE TIMEOUT ST (SAFETY) ×3 IMPLANT
CORDS BIPOLAR (ELECTRODE) ×3 IMPLANT
COVER MAYO STAND STRL (DRAPES) ×3 IMPLANT
COVER TABLE BACK 60X90 (DRAPES) ×3 IMPLANT
CUFF TOURNIQUET SINGLE 18IN (TOURNIQUET CUFF) ×3 IMPLANT
DRAIN PENROSE 1/4X12 LTX STRL (WOUND CARE) IMPLANT
DRAPE EXTREMITY T 121X128X90 (DRAPE) ×3 IMPLANT
DRAPE SURG 17X23 STRL (DRAPES) ×3 IMPLANT
DRSG EMULSION OIL 3X3 NADH (GAUZE/BANDAGES/DRESSINGS) ×3 IMPLANT
GAUZE SPONGE 4X4 16PLY XRAY LF (GAUZE/BANDAGES/DRESSINGS) IMPLANT
GLOVE BIO SURGEON STRL SZ 6.5 (GLOVE) ×3 IMPLANT
GLOVE BIO SURGEON STRL SZ7.5 (GLOVE) ×3 IMPLANT
GLOVE BIOGEL M STRL SZ7.5 (GLOVE) ×3 IMPLANT
GLOVE BIOGEL PI IND STRL 7.0 (GLOVE) ×2 IMPLANT
GLOVE BIOGEL PI IND STRL 8 (GLOVE) ×2 IMPLANT
GLOVE BIOGEL PI INDICATOR 7.0 (GLOVE) ×1
GLOVE BIOGEL PI INDICATOR 8 (GLOVE) ×1
GLOVE ECLIPSE 6.5 STRL STRAW (GLOVE) ×3 IMPLANT
GLOVE SS BIOGEL STRL SZ 8 (GLOVE) ×2 IMPLANT
GLOVE SUPERSENSE BIOGEL SZ 8 (GLOVE) ×1
GOWN PREVENTION PLUS XLARGE (GOWN DISPOSABLE) ×3 IMPLANT
GOWN PREVENTION PLUS XXLARGE (GOWN DISPOSABLE) ×9 IMPLANT
LOOP VESSEL MAXI BLUE (MISCELLANEOUS) IMPLANT
NDL SAFETY ECLIPSE 18X1.5 (NEEDLE) ×2 IMPLANT
NEEDLE HYPO 18GX1.5 SHARP (NEEDLE) ×1
NEEDLE HYPO 22GX1.5 SAFETY (NEEDLE) ×3 IMPLANT
NEEDLE HYPO 25X1 1.5 SAFETY (NEEDLE) ×3 IMPLANT
NS IRRIG 1000ML POUR BTL (IV SOLUTION) ×3 IMPLANT
PACK BASIN DAY SURGERY FS (CUSTOM PROCEDURE TRAY) ×3 IMPLANT
PAD ALCOHOL SWAB (MISCELLANEOUS) ×24 IMPLANT
PAD CAST 3X4 CTTN HI CHSV (CAST SUPPLIES) ×4 IMPLANT
PADDING CAST ABS 4INX4YD NS (CAST SUPPLIES) ×1
PADDING CAST ABS COTTON 4X4 ST (CAST SUPPLIES) ×2 IMPLANT
PADDING CAST COTTON 3X4 STRL (CAST SUPPLIES) ×2
SPONGE GAUZE 4X4 12PLY (GAUZE/BANDAGES/DRESSINGS) IMPLANT
STOCKINETTE 4X48 STRL (DRAPES) ×3 IMPLANT
SUT PROLENE 4 0 PS 2 18 (SUTURE) ×3 IMPLANT
SYR BULB 3OZ (MISCELLANEOUS) ×3 IMPLANT
SYR CONTROL 10ML LL (SYRINGE) ×6 IMPLANT
TOWEL OR 17X24 6PK STRL BLUE (TOWEL DISPOSABLE) ×3 IMPLANT
TOWEL OR NON WOVEN STRL DISP B (DISPOSABLE) ×3 IMPLANT
TRAY DSU PREP LF (CUSTOM PROCEDURE TRAY) ×3 IMPLANT
UNDERPAD 30X30 INCONTINENT (UNDERPADS AND DIAPERS) ×3 IMPLANT
WATER STERILE IRR 1000ML POUR (IV SOLUTION) ×3 IMPLANT

## 2012-08-27 NOTE — Transfer of Care (Signed)
Immediate Anesthesia Transfer of Care Note  Patient: Jason Norton  Procedure(s) Performed: Procedure(s) (LRB) with comments: CARPAL TUNNEL RELEASE (Right) - Right Limited Open Carpal Tunnel Release  STEROID INJECTION (Left)  Patient Location: PACU  Anesthesia Type:MAC  Level of Consciousness: awake, alert  and oriented  Airway & Oxygen Therapy: Patient Spontanous Breathing  Post-op Assessment: Report given to PACU RN and Post -op Vital signs reviewed and stable  Post vital signs: Reviewed and stable  Complications: No apparent anesthesia complications

## 2012-08-27 NOTE — Anesthesia Preprocedure Evaluation (Signed)
Anesthesia Evaluation  Patient identified by MRN, date of birth, ID band Patient awake    Reviewed: Allergy & Precautions, H&P , NPO status , Patient's Chart, lab work & pertinent test results  Airway Mallampati: I TM Distance: >3 FB Neck ROM: Full    Dental  (+) Teeth Intact and Dental Advisory Given   Pulmonary PE breath sounds clear to auscultation        Cardiovascular Rhythm:Regular Rate:Normal     Neuro/Psych    GI/Hepatic   Endo/Other    Renal/GU      Musculoskeletal   Abdominal   Peds  Hematology   Anesthesia Other Findings   Reproductive/Obstetrics                           Anesthesia Physical Anesthesia Plan  ASA: II  Anesthesia Plan: General   Post-op Pain Management:    Induction: Intravenous  Airway Management Planned: LMA  Additional Equipment:   Intra-op Plan:   Post-operative Plan: Extubation in OR  Informed Consent: I have reviewed the patients History and Physical, chart, labs and discussed the procedure including the risks, benefits and alternatives for the proposed anesthesia with the patient or authorized representative who has indicated his/her understanding and acceptance.   Dental advisory given  Plan Discussed with: CRNA, Anesthesiologist and Surgeon  Anesthesia Plan Comments:         Anesthesia Quick Evaluation

## 2012-08-27 NOTE — H&P (Signed)
Jason Norton is an 56 y.o. male.   Chief Complaint:presents for R CTR and L CT injection HPI: Marland KitchenMarland KitchenPatient presents for evaluation and treatment of the of their upper extremity predicament. The patient denies neck back chest or of abdominal pain. The patient notes that they have no lower extremity problems. The patient from primarily complains of the upper extremity pain noted.  Past Medical History  Diagnosis Date  . PE (pulmonary embolism) 2005    hx post freq travel-2005  . DJD (degenerative joint disease)   . Gout   . Hyperlipemia   . Meningitis     at age 60 weeks old    Past Surgical History  Procedure Date  . Knee arthroplasty 1975    lt knee reconstruction hign [phone]  . Knee arthroscopy     x3  . Total knee arthroplasty 2007    rt  . Total knee revision 2011    rt  . Total hip arthroplasty 2012    left  . Wrist arthroscopy 2006    debrid-rt  . Back surgery     lumb lam  . Colonoscopy     History reviewed. No pertinent family history. Social History:  reports that he has never smoked. He does not have any smokeless tobacco history on file. He reports that he drinks alcohol. He reports that he does not use illicit drugs.  Allergies: No Known Allergies  Medications Prior to Admission  Medication Sig Dispense Refill  . allopurinol (ZYLOPRIM) 300 MG tablet Take 300 mg by mouth daily.      . Folic Acid-Vit B6-Vit B12 (FOLBEE) 2.5-25-1 MG TABS Take 1 tablet by mouth daily.      . rosuvastatin (CRESTOR) 5 MG tablet Take 5 mg by mouth daily.      [name] warfarin (COUMADIN) 5 MG tablet Take 5 mg by mouth daily. As directed      . zolpidem (AMBIEN) 10 MG tablet Take 10 mg by mouth at bedtime as needed.        Results for orders placed during the hospital encounter of 08/27/12 (from the past 48 hour(s))  BASIC METABOLIC PANEL     Status: Normal   Collection Time   08/26/12  1:45 PM      Component Value Range Comment   Sodium 139  135 - 145 mEq/L    Potassium 4.5  3.5 -  5.1 mEq/L    Chloride 102  96 - 112 mEq/L    CO2 26  19 - 32 mEq/L    Glucose, Bld 89  70 - 99 mg/dL    BUN 17  6 - 23 mg/dL    Creatinine, Ser 0.98  0.50 - 1.35 mg/dL    Calcium 9.8  8.4 - 11.9 mg/dL    GFR calc non Af Amer >90  >90 mL/min    GFR calc Af Amer >90  >90 mL/min   PROTIME-INR     Status: Normal   Collection Time   08/26/12  1:45 PM      Component Value Range Comment   Prothrombin Time 14.1  11.6 - 15.2 seconds    INR 1.10  0.00 - 1.49   APTT     Status: Abnormal   Collection Time   08/26/12  1:45 PM      Component Value Range Comment   aPTT 41 (*) 24 - 37 seconds    No results found.  Review of Systems  Constitutional: Negative.   Respiratory: Negative.  Cardiovascular: Negative.   Gastrointestinal: Negative.   Genitourinary: Negative.   Neurological: Negative.   Psychiatric/Behavioral: Negative.     Blood pressure 120/86, pulse 78, temperature 97.9 F (36.6 C), temperature source Oral, resp. rate 16, height 5\' 10"  (1.778 m), weight 92.222 kg (203 lb 5 oz), SpO2 96.00%. Physical Exam  ..The patient is alert and oriented in no acute distress the patient complains of pain in the affected upper extremity. The patient is noted to have a normal HEENT exam. Lung fields show equal chest expansion and no shortness of breath abdomen exam is nontender without distention. Lower extremity examination does not show any fracture dislocation or blood clot symptoms. Pelvis is stable neck and back are stable and nontender Assessment/Plan .Marland KitchenWe are planning surgery for your upper extremity. The risk and benefits of surgery include risk of bleeding infection anesthesia damage to normal structures and failure of the surgery to accomplish its intended goals of relieving symptoms and restoring function with this in mind we'll going to proceed. I have specifically discussed with the patient the pre-and postoperative regime and the does and don'ts and risk and benefits in great detail.  Risk and benefits of surgery also include risk of dystrophy chronic nerve pain failure of the healing process to go onto completion and other inherent risks of surgery The relavent the pathophysiology of the disease/injury process, as well as the alternatives for treatment and postoperative course of action has been discussed in great detail with the patient who desires to proceed.  We will do everything in our power to help you (the patient) restore function to the upper extremity. Is a pleasure to see this patient today.   Karen Chafe 08/27/2012, 4:09 PM

## 2012-08-27 NOTE — Op Note (Signed)
See dictation#[id number] Dominica Severin MD

## 2012-08-28 ENCOUNTER — Encounter (HOSPITAL_BASED_OUTPATIENT_CLINIC_OR_DEPARTMENT_OTHER): Payer: Self-pay | Admitting: Orthopedic Surgery

## 2012-08-28 NOTE — Anesthesia Postprocedure Evaluation (Signed)
  Anesthesia Post-op Note  Patient: Jason Norton  Procedure(s) Performed: Procedure(s) (LRB) with comments: CARPAL TUNNEL RELEASE (Right) - Right Limited Open Carpal Tunnel Release  STEROID INJECTION (Left)  Patient Location: PACU  Anesthesia Type:MAC  Level of Consciousness: awake, alert  and oriented  Airway and Oxygen Therapy: Patient Spontanous Breathing  Post-op Pain: none  Post-op Assessment: Post-op Vital signs reviewed  Post-op Vital Signs: Reviewed  Complications: No apparent anesthesia complications

## 2012-08-28 NOTE — Op Note (Signed)
Jason Norton, Jason Norton NO.:  [id number]  MEDICAL RECORD NO.:  [id number]  LOCATION:                                 FACILITY:  PHYSICIAN:  Dionne Ano. Jamesen Stahnke, M.D.DATE OF BIRTH:  10-22-1955  DATE OF PROCEDURE: DATE OF DISCHARGE:                              OPERATIVE REPORT   PREOPERATIVE DIAGNOSIS:  Bilateral carpal tunnel syndrome.  POSTOPERATIVE DIAGNOSIS:  Bilateral carpal tunnel syndrome.  PROCEDURE: 1. Right median nerve/peripheral nerve block at the wrist forearm     level for anesthetic purposes for carpal tunnel release. 2. Right limited to open carpal tunnel release. 3. Left carpal tunnel injection.  SURGEON:  Dionne Ano. Amanda Pea, M.D.  ASSISTANT:  Karie Chimera, P.A.-C.  COMPLICATIONS:  None.  ANESTHESIA:  Peripheral nerve block with IV sedation keeping the patient awake, alert, and oriented during the entire case.  TOURNIQUET TIME:  Less than 15 minutes.  INDICATIONS:  This is a 56 year old male with severe carpal tunnel syndrome.  He presents for the above-mentioned operative intervention, understanding and accepting the risks and benefits of surgery.  OPERATIVE PROCEDURE:  The patient was seen by myself and Anesthesia, taken to the operative suite, underwent smooth induction of  peripheral nerve/median nerve block at the right wrist/median nerve level. Following this, he is prepped and draped in usual sterile fashion, Betadine scrub and paint.  Once adequate anesthesia in the form of the block had been secured, I then elevated the arm.  Tourniquet was insufflated to 250 mmHg and we made an incision 1 cm in nature distally to the transverse carpal ligament.  Dissection was carried down.  Palmar fascia was released in the distal radius.  Transverse carpal ligament was released under 4.0 loupe magnification.  Following this, the fat pad grasped nicely and then distal to proximal dissection was carried out __________ 1, 2, and 3 which were  placed just under the proximal leading leaflet of the transverse carpal ligament.  Following this, I then placed a security clip, obturator disengaged.  The knife was then placed and secured the clip effectively releasing the proximal leaflet of transverse carpal ligament.  The patient was awake, alert, and oriented, in all points and passes and had no complicating features, or unusual discomfort.  I inspected the canal.  The median nerve was very hyperemic, flattened, and had marked changes of chronic compression. Following this, I then performed irrigation, deflated the tourniquet, obtained hemostasis with bipolar electrocautery.  Once this was completed, I then performed closure of the wound with Prolene.  Following the release, the patient underwent injection of 1 mL of Celestone, Sensorcaine, and lidocaine without epinephrine at the wrist level about the left upper extremity.  This was a carpal tunnel injection and the patient tolerated this quite well.  There were no complicating features.  All sponge, needle, and instrument counts were reported as correct. There were no complications.  I have discussed with the patient the findings.  He will return to the office to see [place] in 7 days.  Therapy in twelve days and notify should any problems occur.  It has been an absolute pleasure seeing and treating him today and we look  forward to participate in his postoperative care.     Dionne Ano. Amanda Pea, M.D.     [facility]  D:  08/27/2012  T:  08/28/2012  Job:  [id number]

## 2012-09-07 ENCOUNTER — Encounter (HOSPITAL_COMMUNITY): Payer: Self-pay | Admitting: *Deleted

## 2012-09-07 ENCOUNTER — Encounter (HOSPITAL_COMMUNITY)
Admission: EM | Disposition: A | Discharge status: Home / Self Care | Payer: Self-pay | Source: Home / Self Care | Attending: Emergency Medicine

## 2012-09-07 ENCOUNTER — Encounter (HOSPITAL_COMMUNITY): Payer: Self-pay | Admitting: Anesthesiology

## 2012-09-07 ENCOUNTER — Emergency Department (HOSPITAL_COMMUNITY): Payer: 59 | Admitting: Anesthesiology

## 2012-09-07 ENCOUNTER — Ambulatory Visit (HOSPITAL_COMMUNITY)
Admission: EM | Admit: 2012-09-07 | Discharge: 2012-09-08 | Disposition: A | Discharge status: Home / Self Care | Payer: 59 | Attending: Emergency Medicine | Admitting: Emergency Medicine

## 2012-09-07 DIAGNOSIS — M79609 Pain in unspecified limb: Secondary | ICD-10-CM | POA: Insufficient documentation

## 2012-09-07 DIAGNOSIS — Y834 Other reconstructive surgery as the cause of abnormal reaction of the patient, or of later complication, without mention of misadventure at the time of the procedure: Secondary | ICD-10-CM | POA: Insufficient documentation

## 2012-09-07 DIAGNOSIS — IMO0002 Reserved for concepts with insufficient information to code with codable children: Secondary | ICD-10-CM | POA: Insufficient documentation

## 2012-09-07 DIAGNOSIS — Z86711 Personal history of pulmonary embolism: Secondary | ICD-10-CM

## 2012-09-07 DIAGNOSIS — Z7901 Long term (current) use of anticoagulants: Secondary | ICD-10-CM | POA: Insufficient documentation

## 2012-09-07 DIAGNOSIS — M109 Gout, unspecified: Secondary | ICD-10-CM | POA: Diagnosis present

## 2012-09-07 HISTORY — PX: I&D EXTREMITY: SHX5045

## 2012-09-07 LAB — CBC WITH DIFFERENTIAL/PLATELET
Basophils Absolute: 0 10*3/uL (ref 0.0–0.1)
Basophils Relative: 0 % (ref 0–1)
Eosinophils Absolute: 0.1 10*3/uL (ref 0.0–0.7)
Lymphs Abs: 4.2 10*3/uL — ABNORMAL HIGH (ref 0.7–4.0)
MCH: 27.9 pg (ref 26.0–34.0)
MCHC: 34.2 g/dL (ref 30.0–36.0)
Neutrophils Relative %: 44 % (ref 43–77)
Platelets: 190 10*3/uL (ref 150–400)
RBC: 4.88 MIL/uL (ref 4.22–5.81)

## 2012-09-07 LAB — PROTIME-INR
INR: 1.68 — ABNORMAL HIGH (ref 0.00–1.49)
Prothrombin Time: 19.2 seconds — ABNORMAL HIGH (ref 11.6–15.2)

## 2012-09-07 LAB — POCT I-STAT, CHEM 8
Calcium, Ion: 1.18 mmol/L (ref 1.12–1.23)
Glucose, Bld: 99 mg/dL (ref 70–99)
HCT: 43 % (ref 39.0–52.0)
Hemoglobin: 14.6 g/dL (ref 13.0–17.0)

## 2012-09-07 SURGERY — IRRIGATION AND DEBRIDEMENT EXTREMITY
Anesthesia: General | Site: Hand | Laterality: Right | Wound class: Clean

## 2012-09-07 MED ORDER — PROPOFOL 10 MG/ML IV BOLUS
INTRAVENOUS | Status: DC | PRN
  Administered 2012-09-07: 180 mg via INTRAVENOUS
  Administered 2012-09-07: 20 mg via INTRAVENOUS

## 2012-09-07 MED ORDER — VITAMIN B-12 1000 MCG PO TABS
1000.0000 ug | ORAL_TABLET | Freq: Every day | ORAL | Status: DC
  Administered 2012-09-07 – 2012-09-08 (×2): 1000 ug via ORAL
  Filled 2012-09-07 (×2): qty 1

## 2012-09-07 MED ORDER — DOCUSATE SODIUM 100 MG PO CAPS
100.0000 mg | ORAL_CAPSULE | Freq: Two times a day (BID) | ORAL | Status: DC
  Administered 2012-09-07 – 2012-09-08 (×2): 100 mg via ORAL
  Filled 2012-09-07 (×2): qty 1

## 2012-09-07 MED ORDER — ONDANSETRON HCL 4 MG/2ML IJ SOLN
4.0000 mg | Freq: Once | INTRAMUSCULAR | Status: AC
  Administered 2012-09-07: 4 mg via INTRAVENOUS
  Filled 2012-09-07: qty 2

## 2012-09-07 MED ORDER — VITAMIN B-6 100 MG PO TABS
200.0000 mg | ORAL_TABLET | Freq: Every day | ORAL | Status: DC
  Administered 2012-09-07: 100 mg via ORAL
  Administered 2012-09-08: 200 mg via ORAL
  Filled 2012-09-07 (×2): qty 2

## 2012-09-07 MED ORDER — HYDROMORPHONE HCL PF 1 MG/ML IJ SOLN: 0.5000 mg | INTRAMUSCULAR | Status: DC | PRN

## 2012-09-07 MED ORDER — ONDANSETRON HCL 4 MG/2ML IJ SOLN
4.0000 mg | Freq: Four times a day (QID) | INTRAMUSCULAR | Status: DC | PRN
  Administered 2012-09-07: 4 mg via INTRAVENOUS
  Filled 2012-09-07: qty 2

## 2012-09-07 MED ORDER — OXYCODONE HCL 5 MG PO TABS: 5.0000 mg | ORAL_TABLET | Freq: Once | ORAL | Status: DC | PRN

## 2012-09-07 MED ORDER — METHOCARBAMOL 500 MG PO TABS
500.0000 mg | ORAL_TABLET | Freq: Four times a day (QID) | ORAL | Status: DC | PRN
  Filled 2012-09-07: qty 1

## 2012-09-07 MED ORDER — DIPHENHYDRAMINE HCL 25 MG PO CAPS: 25.0000 mg | ORAL_CAPSULE | Freq: Four times a day (QID) | ORAL | Status: DC | PRN

## 2012-09-07 MED ORDER — LACTATED RINGERS IV SOLN
INTRAVENOUS | Status: DC | PRN
  Administered 2012-09-07 (×2): via INTRAVENOUS

## 2012-09-07 MED ORDER — HYDROMORPHONE HCL PF 1 MG/ML IJ SOLN: 0.2500 mg | INTRAMUSCULAR | Status: DC | PRN

## 2012-09-07 MED ORDER — SODIUM CHLORIDE 0.9 % IV BOLUS (SEPSIS)
500.0000 mL | Freq: Once | INTRAVENOUS | Status: AC
  Administered 2012-09-07: 13:00:00 via INTRAVENOUS

## 2012-09-07 MED ORDER — ONDANSETRON HCL 4 MG/2ML IJ SOLN
INTRAMUSCULAR | Status: DC | PRN
  Administered 2012-09-07: 4 mg via INTRAVENOUS

## 2012-09-07 MED ORDER — ALLOPURINOL 300 MG PO TABS
300.0000 mg | ORAL_TABLET | Freq: Every day | ORAL | Status: DC
  Administered 2012-09-07 – 2012-09-08 (×2): 300 mg via ORAL
  Filled 2012-09-07 (×2): qty 1

## 2012-09-07 MED ORDER — HYDROCODONE-ACETAMINOPHEN 10-325 MG PO TABS: 1.0000 | ORAL_TABLET | ORAL | Status: DC | PRN

## 2012-09-07 MED ORDER — CEFAZOLIN SODIUM 1-5 GM-% IV SOLN
1.0000 g | Freq: Three times a day (TID) | INTRAVENOUS | Status: DC
  Administered 2012-09-08 (×2): 1 g via INTRAVENOUS
  Filled 2012-09-07 (×4): qty 50

## 2012-09-07 MED ORDER — ATORVASTATIN CALCIUM 10 MG PO TABS
10.0000 mg | ORAL_TABLET | Freq: Every day | ORAL | Status: DC
Start: 2012-09-07 — End: 2012-09-08
  Filled 2012-09-07 (×2): qty 1

## 2012-09-07 MED ORDER — CEFAZOLIN SODIUM-DEXTROSE 2-3 GM-% IV SOLR
INTRAVENOUS | Status: DC | PRN
  Administered 2012-09-07: 2 g via INTRAVENOUS

## 2012-09-07 MED ORDER — KCL IN DEXTROSE-NACL 20-5-0.45 MEQ/L-%-% IV SOLN
INTRAVENOUS | Status: DC
  Administered 2012-09-07: 17:00:00 via INTRAVENOUS
  Filled 2012-09-07 (×3): qty 1000

## 2012-09-07 MED ORDER — MIDAZOLAM HCL 5 MG/5ML IJ SOLN
INTRAMUSCULAR | Status: DC | PRN
  Administered 2012-09-07: 2 mg via INTRAVENOUS

## 2012-09-07 MED ORDER — ONDANSETRON HCL 4 MG PO TABS: 4.0000 mg | ORAL_TABLET | Freq: Four times a day (QID) | ORAL | Status: DC | PRN

## 2012-09-07 MED ORDER — SODIUM CHLORIDE 0.9 % IR SOLN
Status: DC | PRN
  Administered 2012-09-07: 1000 mL

## 2012-09-07 MED ORDER — HYDROMORPHONE HCL PF 1 MG/ML IJ SOLN
1.0000 mg | Freq: Once | INTRAMUSCULAR | Status: AC
  Administered 2012-09-07: 1 mg via INTRAVENOUS
  Filled 2012-09-07: qty 1

## 2012-09-07 MED ORDER — BUPIVACAINE HCL (PF) 0.25 % IJ SOLN
INTRAMUSCULAR | Status: AC
  Filled 2012-09-07: qty 30

## 2012-09-07 MED ORDER — PROMETHAZINE HCL 25 MG/ML IJ SOLN: 6.2500 mg | INTRAMUSCULAR | Status: DC | PRN

## 2012-09-07 MED ORDER — VITAMIN C 500 MG PO TABS
1000.0000 mg | ORAL_TABLET | Freq: Every day | ORAL | Status: DC
  Administered 2012-09-07 – 2012-09-08 (×2): 1000 mg via ORAL
  Filled 2012-09-07 (×2): qty 2

## 2012-09-07 MED ORDER — VITAMIN C 500 MG PO TABS: 1000.0000 mg | ORAL_TABLET | Freq: Every day | ORAL | Status: DC

## 2012-09-07 MED ORDER — WARFARIN SODIUM 5 MG PO TABS: 5.0000 mg | ORAL_TABLET | Freq: Every day | ORAL | Status: DC

## 2012-09-07 MED ORDER — CEFAZOLIN SODIUM-DEXTROSE 2-3 GM-% IV SOLR
INTRAVENOUS | Status: AC
  Filled 2012-09-07: qty 50

## 2012-09-07 MED ORDER — DEXTROSE 5 % IV SOLN: 500.0000 mg | Freq: Four times a day (QID) | INTRAVENOUS | Status: DC | PRN

## 2012-09-07 MED ORDER — OXYCODONE HCL 5 MG/5ML PO SOLN: 5.0000 mg | Freq: Once | ORAL | Status: DC | PRN

## 2012-09-07 MED ORDER — FINASTERIDE 5 MG PO TABS
2.5000 mg | ORAL_TABLET | Freq: Every day | ORAL | Status: DC
  Administered 2012-09-07 – 2012-09-08 (×2): 2.5 mg via ORAL
  Filled 2012-09-07 (×2): qty 0.5

## 2012-09-07 MED ORDER — COLCHICINE 0.6 MG PO TABS
0.6000 mg | ORAL_TABLET | Freq: Two times a day (BID) | ORAL | Status: DC
  Administered 2012-09-07 – 2012-09-08 (×2): 0.6 mg via ORAL
  Filled 2012-09-07 (×3): qty 1

## 2012-09-07 MED ORDER — MIDAZOLAM HCL 2 MG/2ML IJ SOLN: 1.0000 mg | INTRAMUSCULAR | Status: DC | PRN

## 2012-09-07 MED ORDER — FENTANYL CITRATE 0.05 MG/ML IJ SOLN
INTRAMUSCULAR | Status: DC | PRN
  Administered 2012-09-07 (×4): 50 ug via INTRAVENOUS

## 2012-09-07 MED ORDER — OXYCODONE HCL 5 MG PO TABS
5.0000 mg | ORAL_TABLET | ORAL | Status: DC | PRN
  Administered 2012-09-08 (×2): 5 mg via ORAL
  Filled 2012-09-07 (×2): qty 1

## 2012-09-07 MED ORDER — ENOXAPARIN SODIUM 150 MG/ML ~~LOC~~ SOLN: 100.0000 mg | Freq: Two times a day (BID) | SUBCUTANEOUS | Status: DC

## 2012-09-07 MED ORDER — ALPRAZOLAM 0.5 MG PO TABS: 0.5000 mg | ORAL_TABLET | Freq: Four times a day (QID) | ORAL | Status: DC | PRN

## 2012-09-07 MED ORDER — BUPIVACAINE HCL (PF) 0.25 % IJ SOLN
INTRAMUSCULAR | Status: DC | PRN
  Administered 2012-09-07: 6 mL

## 2012-09-07 MED ORDER — FENTANYL CITRATE 0.05 MG/ML IJ SOLN: 50.0000 ug | Freq: Once | INTRAMUSCULAR | Status: DC

## 2012-09-07 MED ORDER — HYDROMORPHONE HCL PF 1 MG/ML IJ SOLN
1.0000 mg | Freq: Once | INTRAMUSCULAR | Status: AC
Start: 2012-09-07 — End: 2012-09-07
  Administered 2012-09-07: 1 mg via INTRAVENOUS
  Filled 2012-09-07: qty 1

## 2012-09-07 MED ORDER — CEFAZOLIN SODIUM 1-5 GM-% IV SOLN
1.0000 g | INTRAVENOUS | Status: AC
  Administered 2012-09-07: 1 g via INTRAVENOUS
  Filled 2012-09-07: qty 50

## 2012-09-07 SURGICAL SUPPLY — 56 items
BANDAGE CONFORM 2  STR LF (GAUZE/BANDAGES/DRESSINGS) IMPLANT
BANDAGE ELASTIC 3 VELCRO ST LF (GAUZE/BANDAGES/DRESSINGS) ×2 IMPLANT
BANDAGE ELASTIC 4 VELCRO ST LF (GAUZE/BANDAGES/DRESSINGS) ×2 IMPLANT
BANDAGE GAUZE ELAST BULKY 4 IN (GAUZE/BANDAGES/DRESSINGS) ×2 IMPLANT
BNDG COHESIVE 1X5 TAN STRL LF (GAUZE/BANDAGES/DRESSINGS) IMPLANT
BNDG ESMARK 4X9 LF (GAUZE/BANDAGES/DRESSINGS) ×2 IMPLANT
CLOTH BEACON ORANGE TIMEOUT ST (SAFETY) ×2 IMPLANT
CORDS BIPOLAR (ELECTRODE) ×2 IMPLANT
COVER SURGICAL LIGHT HANDLE (MISCELLANEOUS) ×2 IMPLANT
CUFF TOURNIQUET SINGLE 18IN (TOURNIQUET CUFF) ×2 IMPLANT
CUFF TOURNIQUET SINGLE 24IN (TOURNIQUET CUFF) IMPLANT
DRAIN PENROSE 1/4X12 LTX STRL (WOUND CARE) IMPLANT
DRAPE SURG 17X23 STRL (DRAPES) ×2 IMPLANT
DRSG ADAPTIC 3X8 NADH LF (GAUZE/BANDAGES/DRESSINGS) ×2 IMPLANT
DRSG EMULSION OIL 3X3 NADH (GAUZE/BANDAGES/DRESSINGS) ×2 IMPLANT
ELECT REM PT RETURN 9FT ADLT (ELECTROSURGICAL)
ELECTRODE REM PT RTRN 9FT ADLT (ELECTROSURGICAL) IMPLANT
GAUZE XEROFORM 1X8 LF (GAUZE/BANDAGES/DRESSINGS) ×2 IMPLANT
GAUZE XEROFORM 5X9 LF (GAUZE/BANDAGES/DRESSINGS) IMPLANT
GLOVE BIOGEL PI IND STRL 8.5 (GLOVE) ×1 IMPLANT
GLOVE BIOGEL PI INDICATOR 8.5 (GLOVE) ×1
GLOVE SURG ORTHO 8.0 STRL STRW (GLOVE) ×2 IMPLANT
GOWN PREVENTION PLUS XLARGE (GOWN DISPOSABLE) ×2 IMPLANT
GOWN STRL NON-REIN LRG LVL3 (GOWN DISPOSABLE) ×6 IMPLANT
HANDPIECE INTERPULSE COAX TIP (DISPOSABLE)
KIT BASIN OR (CUSTOM PROCEDURE TRAY) ×2 IMPLANT
KIT ROOM TURNOVER OR (KITS) ×2 IMPLANT
MANIFOLD NEPTUNE II (INSTRUMENTS) ×2 IMPLANT
NEEDLE HYPO 25GX1X1/2 BEV (NEEDLE) IMPLANT
NS IRRIG 1000ML POUR BTL (IV SOLUTION) ×2 IMPLANT
PACK ORTHO EXTREMITY (CUSTOM PROCEDURE TRAY) ×2 IMPLANT
PAD ARMBOARD 7.5X6 YLW CONV (MISCELLANEOUS) ×4 IMPLANT
PAD CAST 4YDX4 CTTN HI CHSV (CAST SUPPLIES) ×1 IMPLANT
PADDING CAST ABS 4INX4YD NS (CAST SUPPLIES) ×1
PADDING CAST ABS COTTON 4X4 ST (CAST SUPPLIES) ×1 IMPLANT
PADDING CAST COTTON 4X4 STRL (CAST SUPPLIES) ×1
SET HNDPC FAN SPRY TIP SCT (DISPOSABLE) IMPLANT
SOAP 2 % CHG 4 OZ (WOUND CARE) ×2 IMPLANT
SPLINT PLASTER CAST XFAST 3X15 (CAST SUPPLIES) ×1 IMPLANT
SPLINT PLASTER XTRA FASTSET 3X (CAST SUPPLIES) ×1
SPONGE GAUZE 4X4 12PLY (GAUZE/BANDAGES/DRESSINGS) ×2 IMPLANT
SPONGE LAP 18X18 X RAY DECT (DISPOSABLE) ×2 IMPLANT
SPONGE LAP 4X18 X RAY DECT (DISPOSABLE) ×2 IMPLANT
SUCTION FRAZIER TIP 10 FR DISP (SUCTIONS) ×2 IMPLANT
SUT ETHILON 4 0 PS 2 18 (SUTURE) IMPLANT
SUT ETHILON 5 0 P 3 18 (SUTURE)
SUT NYLON ETHILON 5-0 P-3 1X18 (SUTURE) IMPLANT
SUT PROLENE 4 0 PS 2 18 (SUTURE) ×2 IMPLANT
SYR CONTROL 10ML LL (SYRINGE) IMPLANT
TOWEL OR 17X24 6PK STRL BLUE (TOWEL DISPOSABLE) ×2 IMPLANT
TOWEL OR 17X26 10 PK STRL BLUE (TOWEL DISPOSABLE) ×2 IMPLANT
TUBE ANAEROBIC SPECIMEN COL (MISCELLANEOUS) IMPLANT
TUBE CONNECTING 12X1/4 (SUCTIONS) ×2 IMPLANT
UNDERPAD 30X30 INCONTINENT (UNDERPADS AND DIAPERS) ×2 IMPLANT
WATER STERILE IRR 1000ML POUR (IV SOLUTION) ×2 IMPLANT
YANKAUER SUCT BULB TIP NO VENT (SUCTIONS) ×2 IMPLANT

## 2012-09-07 NOTE — ED Notes (Signed)
Pt is 11 days post op from CTR per Dr. Amanda Pea.  Started yesterday with increasing pain and swelling right hand. Hand color pale, poor cap refill. States right thumb is "completely numb". Other fingers are also numb but not as much.

## 2012-09-07 NOTE — ED Notes (Signed)
Pt transferred to OR via stretcher. Belongings taken by wife.

## 2012-09-07 NOTE — Transfer of Care (Signed)
Immediate Anesthesia Transfer of Care Note  Patient: Jason Norton  Procedure(s) Performed: Procedure(s) (LRB) with comments: IRRIGATION AND DEBRIDEMENT EXTREMITY (Right)  Patient Location: PACU  Anesthesia Type:General  Level of Consciousness: awake, alert , oriented and patient cooperative  Airway & Oxygen Therapy: Patient Spontanous Breathing and Patient connected to nasal cannula oxygen  Post-op Assessment: Report given to PACU RN, Post -op Vital signs reviewed and stable and Patient moving all extremities  Post vital signs: Reviewed and stable  Complications: No apparent anesthesia complications

## 2012-09-07 NOTE — ED Provider Notes (Signed)
History   This chart was scribed for [employer]. Rubin Payor, MD by Charolett Bumpers, ED Scribe. The patient was seen in room TR07C/TR07C. Patient's care was started at 1211.   CSN: [id number]  Arrival date & time 09/07/12  1139   First MD Initiated Contact with Patient 09/07/12 1211      Chief Complaint  Patient presents with  . Hand Pain    The history is provided by the patient. No language interpreter was used.   Jason Norton is a 56 y.o. male who presents to the Emergency Department complaining of constant, gradually worsening severe right hand pain. He states that he had carpal tunnel surgery on 12/17. He reports he was improving after the surgery up until 24-36 hours ago. He states the sensation in his hand was returning, but has since diminished due to the swelling and pain. He denies any falls or injuries. He states the swelling and pain has increased. He has a h/o gout in which he has had surgery in the past as well. He denies any fevers. He is on Coumadin and Lovenox currently, started back one day post-op. He states his INR was 1.7 when last checked.   Past Medical History  Diagnosis Date  . PE (pulmonary embolism) 2005    hx post freq travel-2005  . DJD (degenerative joint disease)   . Gout   . Hyperlipemia   . Meningitis     at age 20 weeks old    Past Surgical History  Procedure Date  . Knee arthroplasty 1975    lt knee reconstruction hign [phone]  . Knee arthroscopy     x3  . Total knee arthroplasty 2007    rt  . Total knee revision 2011    rt  . Total hip arthroplasty 2012    left  . Wrist arthroscopy 2006    debrid-rt  . Back surgery     lumb lam  . Colonoscopy   . Carpal tunnel release 08/27/2012    Procedure: CARPAL TUNNEL RELEASE;  Surgeon: Dominica Severin, MD;  Location: [facility];  Service: Orthopedics;  Laterality: Right;  Right Limited Open Carpal Tunnel Release   . Steriod injection 08/27/2012    Procedure: STEROID  INJECTION;  Surgeon: Dominica Severin, MD;  Location: [facility];  Service: Orthopedics;  Laterality: Left;    History reviewed. No pertinent family history.  History  Substance Use Topics  . Smoking status: Never Smoker   . Smokeless tobacco: Not on file  . Alcohol Use: Yes     Comment: very rare      Review of Systems  Constitutional: Negative for fever.  Musculoskeletal: Positive for joint swelling and arthralgias.  All other systems reviewed and are negative.    Allergies  Review of patient's allergies indicates no known allergies.  Home Medications   Current Outpatient Rx  Name  Route  Sig  Dispense  Refill  . ALLOPURINOL 300 MG PO TABS   Oral   Take 300 mg by mouth daily.         [name] VITAMIN C 1000 MG PO TABS   Oral   Take 1,000 mg by mouth daily.         . COLCHICINE 0.6 MG PO TABS   Oral   Take 0.6 mg by mouth daily.         [name] FINASTERIDE 5 MG PO TABS   Oral   Take 1.25 mg by mouth daily.         [name]  HYDROCODONE-ACETAMINOPHEN 5-500 MG PO TABS   Oral   Take 1 tablet by mouth every 4 (four) hours as needed for pain.   40 tablet   0   . VITAMIN B-6 100 MG PO TABS   Oral   Take 200 mg by mouth daily.         [name] ROSUVASTATIN CALCIUM 5 MG PO TABS   Oral   Take 5 mg by mouth daily.         [name] VITAMIN B-12 1000 MCG PO TABS   Oral   Take 1,000 mcg by mouth daily.         Barron Alvine SODIUM 5 MG PO TABS   Oral   Take 5 mg by mouth every Monday, Wednesday, and Friday. As directed         . WARFARIN SODIUM 5 MG PO TABS   Oral   Take 2.5 mg by mouth daily. Tuesday, Thursday, Saturday, and Sunday         . ZOLPIDEM TARTRATE 10 MG PO TABS   Oral   Take 10 mg by mouth at bedtime.            BP 118/106  Pulse 78  Temp 98.6 F (37 C) (Oral)  Resp 26  SpO2 97%  Physical Exam  Nursing note and vitals reviewed. Constitutional: He is oriented to person, place, and time. He appears well-developed and well-nourished. No  distress.  HENT:  Head: Normocephalic and atraumatic.  Eyes: EOM are normal.  Neck: Neck supple. No tracheal deviation present.  Cardiovascular: Normal rate.   Pulmonary/Chest: Effort normal. No respiratory distress.  Musculoskeletal: He exhibits edema and tenderness.       Tenderness and swelling from right mid forearm down to hand. Sutures intact from a previous carpal tunnel surgery. Ecchymosis at the wrist area. Lightened color and swelling of right hand but with good capillary refill. Decreased sensation and active flexion/extension. Moderate passive flexion and extension.   Neurological: He is alert and oriented to person, place, and time.  Skin: Skin is warm and dry.  Psychiatric: He has a normal mood and affect. His behavior is normal.    ED Course  Procedures (including critical care time)  COORDINATION OF CARE:  12:15-Discussed planned course of treatment with the patient including consulting the hand specialist on call, blood work and pain medication, who is agreeable at this time.   12:30-Medication Orders: Sodium chloride 0.9% bolus 500 mL-once; Hydromorphone (Dilaudid) injection 1 mg-once; Ondansetron (Zofran) injection 4 mg-once.   12:53-Dr. Orlan Leavens to see pt in ED.   Labs Reviewed  CBC WITH DIFFERENTIAL - Abnormal; Notable for the following:    Lymphocytes Relative 49 (*)     Lymphs Abs 4.2 (*)     All other components within normal limits  PROTIME-INR - Abnormal; Notable for the following:    Prothrombin Time 19.2 (*)     INR 1.68 (*)     All other components within normal limits  APTT - Abnormal; Notable for the following:    aPTT 46 (*)     All other components within normal limits  POCT I-STAT, CHEM 8   No results found.   1. Postoperative hematoma       MDM  Patient with increasing hand pain after carpal tunnel surgery. Likely hematoma. Will be taken to the OR by Dr. Orlan Leavens   I personally performed the services described in this documentation,  which was scribed in my presence. The recorded information  has been reviewed and is accurate.        Juliet Rude. Rubin Payor, MD 09/07/12 1342

## 2012-09-07 NOTE — Anesthesia Preprocedure Evaluation (Addendum)
Anesthesia Evaluation  Patient identified by MRN, date of birth, ID band Patient awake    Reviewed: Allergy & Precautions, H&P , NPO status , Patient's Chart, lab work & pertinent test results  History of Anesthesia Complications (+) PONV  Airway Mallampati: I TM Distance: >3 FB Neck ROM: Full    Dental  (+) Teeth Intact and Dental Advisory Given   Pulmonary neg pulmonary ROS, PE breath sounds clear to auscultation        Cardiovascular negative cardio ROS  Rhythm:Regular Rate:Normal     Neuro/Psych negative neurological ROS     GI/Hepatic negative GI ROS, Neg liver ROS,   Endo/Other  negative endocrine ROS  Renal/GU   negative genitourinary   Musculoskeletal negative musculoskeletal ROS (+)   Abdominal (+)  Abdomen: soft. Bowel sounds: normal.  Peds negative pediatric ROS (+)  Hematology negative hematology ROS (+)   Anesthesia Other Findings   Reproductive/Obstetrics negative OB ROS                          Anesthesia Physical Anesthesia Plan  ASA: II and emergent  Anesthesia Plan: General   Post-op Pain Management:    Induction: Intravenous  Airway Management Planned: LMA  Additional Equipment:   Intra-op Plan:   Post-operative Plan: Extubation in OR  Informed Consent: I have reviewed the patients History and Physical, chart, labs and discussed the procedure including the risks, benefits and alternatives for the proposed anesthesia with the patient or authorized representative who has indicated his/her understanding and acceptance.     Plan Discussed with: CRNA and Surgeon  Anesthesia Plan Comments:         Anesthesia Quick Evaluation

## 2012-09-07 NOTE — ED Notes (Addendum)
MD at bedside.Hand-Ortmann

## 2012-09-07 NOTE — ED Notes (Signed)
Pt reports having carpal tunnel surgery right wrist on 12/17. Having severe pain x 24 hours, tearful and anxious at triage. Thinks pain may be related to gout also, no relief with pain meds at home.

## 2012-09-07 NOTE — Anesthesia Postprocedure Evaluation (Signed)
  Anesthesia Post-op Note  Patient: Jason Norton  Procedure(s) Performed: Procedure(s) (LRB) with comments: IRRIGATION AND DEBRIDEMENT EXTREMITY (Right)  Patient Location: PACU  Anesthesia Type:General  Level of Consciousness: awake and alert   Airway and Oxygen Therapy: Patient Spontanous Breathing  Post-op Pain: mild  Post-op Assessment: Post-op Vital signs reviewed, Patient's Cardiovascular Status Stable, Respiratory Function Stable, Patent Airway, No signs of Nausea or vomiting and Pain level controlled  Post-op Vital Signs: stable  Complications: No apparent anesthesia complications

## 2012-09-07 NOTE — H&P (Signed)
Jason Norton is an 56 y.o. male.   Chief Complaint: right hand pain HPI: PT UNDERWENT CARPAL TUNNEL RELEASE 12/17. WAS DOING WELL, IN OFFICE ON Tuesday AND HAD DRESSING CHANGED OVER LAST 24 HOURS INCREASED PAIN SIGNIFICANTLY, NOT CONTROLLED WITH ORAL MEDICATIONS BEEN TAKING OXYCODONE AND OXYCONTIN AND DILAUDID PT STATES HE FEELS AS THOUGH HIS THUMB IS GOING TO EXPLODE PT ALSO REPORTS NUMBNESS IN HIS RADIAL THREE DIGITS THAT IS WORSENING  Past Medical History  Diagnosis Date  . PE (pulmonary embolism) 2005    hx post freq travel-2005  . DJD (degenerative joint disease)   . Gout   . Hyperlipemia   . Meningitis     at age 74 weeks old    Past Surgical History  Procedure Date  . Knee arthroplasty 1975    lt knee reconstruction hign [phone]  . Knee arthroscopy     x3  . Total knee arthroplasty 2007    rt  . Total knee revision 2011    rt  . Total hip arthroplasty 2012    left  . Wrist arthroscopy 2006    debrid-rt  . Back surgery     lumb lam  . Colonoscopy   . Carpal tunnel release 08/27/2012    Procedure: CARPAL TUNNEL RELEASE;  Surgeon: Dominica Severin, MD;  Location: [facility];  Service: Orthopedics;  Laterality: Right;  Right Limited Open Carpal Tunnel Release   . Steriod injection 08/27/2012    Procedure: STEROID INJECTION;  Surgeon: Dominica Severin, MD;  Location:  [facility];  Service: Orthopedics;  Laterality: Left;    History reviewed. No pertinent family history. Social History:  reports that he has never smoked. He does not have any smokeless tobacco history on file. He reports that he drinks alcohol. He reports that he does not use illicit drugs.  Allergies: No Known Allergies   (Not in a hospital admission)  Results for orders placed during the hospital encounter of 09/07/12 (from the past 48 hour(s))  CBC WITH DIFFERENTIAL     Status: Abnormal   Collection Time   09/07/12 12:16 PM      Component Value Range Comment   WBC  8.6  4.0 - 10.5 K/uL    RBC 4.88  4.22 - 5.81 MIL/uL    Hemoglobin 13.6  13.0 - 17.0 g/dL    HCT 09.8  11.9 - 14.7 %    MCV 81.6  78.0 - 100.0 fL    MCH 27.9  26.0 - 34.0 pg    MCHC 34.2  30.0 - 36.0 g/dL    RDW 82.9  56.2 - 13.0 %    Platelets 190  150 - 400 K/uL    Neutrophils Relative 44  43 - 77 %    Neutro Abs 3.8  1.7 - 7.7 K/uL    Lymphocytes Relative 49 (*) 12 - 46 %    Lymphs Abs 4.2 (*) 0.7 - 4.0 K/uL    Monocytes Relative 6  3 - 12 %    Monocytes Absolute 0.5  0.1 - 1.0 K/uL    Eosinophils Relative 1  0 - 5 %    Eosinophils Absolute 0.1  0.0 - 0.7 K/uL    Basophils Relative 0  0 - 1 %    Basophils Absolute 0.0  0.0 - 0.1 K/uL   PROTIME-INR     Status: Abnormal   Collection Time   09/07/12 12:16 PM      Component Value Range Comment  Prothrombin Time 19.2 (*) 11.6 - 15.2 seconds    INR 1.68 (*) 0.00 - 1.49   APTT     Status: Abnormal   Collection Time   09/07/12 12:16 PM      Component Value Range Comment   aPTT 46 (*) 24 - 37 seconds    No results found.  NO RECENT ILLNESSES OR HOSPITALIZATIONS  Blood pressure 118/106, pulse 78, temperature 98.6 F (37 C), temperature source Oral, resp. rate 26, SpO2 97.00%. General Appearance:  Alert, cooperative, IS NOT COMFORTABLE, CAN NOT SIT STILL, appears stated age  Head:  Normocephalic, without obvious abnormality, atraumatic  Eyes:  Pupils equal, conjunctiva/corneas clear,         Throat: Lips, mucosa, and tongue normal; teeth and gums normal  Neck: No visible masses     Lungs:   respirations unlabored  Chest Wall:  No tenderness or deformity  Heart:  Regular rate and rhythm,  Abdomen:   Soft, non-tender,         Extremities: RIGHT HAND: SUTURES IN PLACE, LARGE HEMATOMA OVER THENAR EMINENCE MARKED TENDERNESS TO PALPATION. LIMITED DIGITAL MOTION, WRIST CREASE VERY TENDER AND SWOLLEN, FINGERS WARM WELL PERFUSED. LIMITED DIGITAL MOBILITY  Pulses: 2+ and symmetric  Skin: Skin color, texture, turgor normal, no  rashes or lesions     Neurologic: Normal    Assessment/Plan RIGHT HAND CARPAL TUNNEL S/P CARPAL TUNNEL RELEASE WITH EXPANDING HEMATOMA ANTICOAGULATION THERAPY  PT WITH SEVERE PAIN IN HAND WITH S/S C/W WITH EXPANDING HEMATOMA OF HAND. HAVE HAD DISCUSSION WITH PATIENT AND FAMILY AND BASED ON HIS PAIN LEVEL THAT IS NOT CONTROLLABLE WITH ORAL AN IV MEDS WILL PROCEED TO OR WITH INCISION AND DRAINAGE AND EVACUATION OF HEMATOMA  PT VOICED UNDERSTANDING OF THE PLAN. WILL ADMIT FOLLOWING SURGERY WILL ASK DR. TISOVEC TO SEE POST OP FOR ANTI-COAGULATION MANAGEMENT.  R/B/A DISCUSSED WITH PT IN ED.  PT VOICED UNDERSTANDING OF PLAN CONSENT SIGNED DAY OF SURGERY PT SEEN AND EXAMINED PRIOR TO OPERATIVE PROCEDURE/DAY OF SURGERY SITE MARKED. QUESTIONS ANSWERED WILL BE ADMITTED FOLLOWING SURGERY  Sharma Covert 09/07/2012, 1:02 PM

## 2012-09-07 NOTE — Anesthesia Procedure Notes (Signed)
Procedure Name: LMA Insertion Date/Time: 09/07/2012 2:21 PM Performed by: Ellin Goodie Pre-anesthesia Checklist: Patient identified, Emergency Drugs available, Suction available, Patient being monitored and Timeout performed Patient Re-evaluated:Patient Re-evaluated prior to inductionOxygen Delivery Method: Circle system utilized Preoxygenation: Pre-oxygenation with 100% oxygen Intubation Type: IV induction Ventilation: Mask ventilation without difficulty LMA: LMA inserted LMA Size: 5.0 Number of attempts: 1 Placement Confirmation: positive ETCO2 and breath sounds checked- equal and bilateral Tube secured with: Tape Dental Injury: Teeth and Oropharynx as per pre-operative assessment

## 2012-09-07 NOTE — Consult Note (Signed)
PCP:   Gaspar Garbe, MD   Requesting Physician: Bradly Bienenstock, MD  Reason for Consult: Anticoagulation management after carpal tunnel hematoma evacuation  HPI: Mr. Rosamaria Lints is a 56 year old white male with a history of pulmonary embolism (12/03) on chronic anticoagulation who underwent a recent right carpal total repair on 12/17. He was doing well postoperatively on a Lovenox bridge. He had his INR checked several days ago and it was still 1.6 despite 7 days of Lovenox and Coumadin. Therefore, his Coumadin was increased to 10 mg daily.  Last evening, he noticed a significant increase in right wrist pain. He initially thought that he had an acute gout flare which she's had in his wrist before. This prompted him to come to the emergency department where he was found have a significant hematoma. He was evaluated by orthopedics and taken to the operating room for evacuation. He feels much better after this procedure. We are consulted for anticoagulation management.  He reports that he had an isolated episode of DVT and PE in 12/03 following multiple plane trips along the [facility]. Due to the extent of his blood clots at that time it was recommended that he remain on lifelong Coumadin. He does not recall ever having hypercoagulable workup. His family history is unknown as he is adopted. He has remained on Coumadin since for the past 10 years without any issues. He has had several surgeries for the past few years and was treated with a Lovenox bridge for these procedures without incident. Generally, his Coumadin has been easy to regulate with 5 mg alternating with 2.5 mg. He does not told of any significant change in his diet recently. He has not had any bleeding complications in the past.  Review of Systems:  Review of Systems - all systems reviewed with the patient are negative except in history of present illness. He is very tired after being awake all night due to pain.  Past Medical  History: Pulmonary Embolus December 2003 Gout  OA R Knee Diverticulosis/itis HSV 1 Hyperlipidemia Insomnia Male Pattern Baldness BPH Carpal Tunnel, Bilateral  Past Surgical History  Procedure Date  . Knee arthroplasty 1975    lt knee reconstruction hign [phone]  . Knee arthroscopy     x3  . Total knee arthroplasty 2007    rt  . Total knee revision 2011    rt  . Total hip arthroplasty 2012    left  . Wrist arthroscopy 2006    debrid-rt  . Back surgery     lumb lam  . Colonoscopy   . Carpal tunnel release 08/27/2012    Procedure: CARPAL TUNNEL RELEASE;  Surgeon: Dominica Severin, MD;  Location: [facility];  Service: Orthopedics;  Laterality: Right;  Right Limited Open Carpal Tunnel Release   . Steriod injection 08/27/2012    Procedure: STEROID INJECTION;  Surgeon: Dominica Severin, MD;  Location: [facility];  Service: Orthopedics;  Laterality: Left;    Medications: Prior to Admission medications   Medication Sig Start Date End Date Taking? Authorizing Provider  allopurinol (ZYLOPRIM) 300 MG tablet Take 300 mg by mouth daily.   Yes Historical Provider, MD  Ascorbic Acid (VITAMIN C) 1000 MG tablet Take 1,000 mg by mouth daily.   Yes Historical Provider, MD  colchicine 0.6 MG tablet Take 0.6 mg by mouth daily.   Yes Historical Provider, MD  finasteride (PROSCAR) 5 MG tablet Take 1.25 mg by mouth daily.   Yes Historical Provider, MD  HYDROcodone-acetaminophen Haskell Flirt)  5-500 MG per tablet Take 1 tablet by mouth every 4 (four) hours as needed for pain. 08/27/12  Yes Sheran Lawless, PA  pyridOXINE (VITAMIN B-6) 100 MG tablet Take 200 mg by mouth daily.   Yes Historical Provider, MD  rosuvastatin (CRESTOR) 5 MG tablet Take 5 mg by mouth daily.   Yes Historical Provider, MD  vitamin B-12 (CYANOCOBALAMIN) 1000 MCG tablet Take 1,000 mcg by mouth daily.   Yes Historical Provider, MD  warfarin (COUMADIN) 5 MG tablet Take 5 mg by mouth every Monday,  Wednesday, and Friday. As directed   Yes Historical Provider, MD  warfarin (COUMADIN) 5 MG tablet Take 2.5 mg by mouth daily. Tuesday, Thursday, Saturday, and Sunday   Yes Historical Provider, MD  zolpidem (AMBIEN) 10 MG tablet Take 10 mg by mouth at bedtime.    Yes Historical Provider, MD    Allergies:  No Known Allergies  Social History:  reports that he has never smoked. He does not have any smokeless tobacco history on file. He reports that he drinks alcohol. He reports that he does not use illicit drugs. Married, no children Adoptive mom=Elizabeth Bentley  Family History: Adopted- unknown  Physical Exam: Filed Vitals:   09/07/12 1143 09/07/12 1500  BP: 118/106 135/93  Pulse: 78   Temp: 98.6 F (37 C) 96.8 F (36 C)  TempSrc: Oral   Resp: 26 12  SpO2: 97% 98%   General appearance: Fatigued in no acute distress. Head: Normocephalic, without obvious abnormality, atraumatic Eyes: conjunctivae/corneas clear. PERRL, EOM's intact.  Nose: Nares normal. Septum midline. Mucosa normal. No drainage or sinus tenderness. Throat: lips, mucosa, and tongue normal; teeth and gums normal Neck: no adenopathy, no carotid bruit, no JVD and thyroid not enlarged, symmetric, no tenderness/mass/nodules Resp: clear to auscultation bilaterally Cardio: regular rate and rhythm GI: soft, non-tender; bowel sounds normal; no masses,  no organomegaly Extremities: Right wrist wrapped without any surrounding blood. Pulses: 2+ and symmetric Lymph nodes: Cervical adenopathy: no cervical lymphadenopathy Neurologic: Alert and oriented X 3, normal strength and tone. Normal symmetric reflexes.     Labs on Admission:   [facility] 09/07/12 1300  NA 137  K 4.1  CL 100  CO2 --  GLUCOSE 99  BUN 13  CREATININE 1.10  CALCIUM --  MG --  PHOS --    Basename 09/07/12 1300 09/07/12 1216  WBC -- 8.6  NEUTROABS -- 3.8  HGB 14.6 13.6  HCT 43.0 39.8  MCV -- 81.6  PLT -- 190   No results found for this  basename: CKTOTAL:3,CKMB:3,CKMBINDEX:3,TROPONINI:3 in the last 72 hours Lab Results  Component Value Date   INR 1.68* 09/07/2012   INR 1.10 08/26/2012   INR 2.15* 04/03/2011    Radiological Exams on Admission: No results found. Orders placed during the hospital encounter of 01/19/11  . EKG    Assessment/Plan Principal Problem: 1. *Hematoma, postoperative- he is much better after evacuation of his hematoma. Will try to keep him off of his anticoagulation for 24 hours to allow adequate hemostasis.  Continue SCDs and ambulation for DVT prophylaxis in the meantime.  Active Problems: 2. History of pulmonary embolism on Chronic anticoagulation-  He does not have a documented hypercoagulable condition but has been relegated to lifelong anticoagulation due to the extent of clots in 12/03. Will plan to resume his Lovenox 100 mg subcutaneous twice a day and Coumadin 5 mg daily tomorrow evening if no further bleeding is noted (Rxs provided). He'll need to monitor closely for recurrent  bleeding issues. We'll plan to check an INR on Tuesday at our office and hopefully can discontinue Lovenox at that time if INR is therapeutic. We did discuss alternative anticoagulant options including Xarelto which may make future surgeries more convenient. He will discuss this with Dr. Wylene Simmer in the future. 3. GOUT- continue Allopurinol.  Adamari Frede,W DOUGLAS 09/07/2012, 5:54 PM

## 2012-09-07 NOTE — Brief Op Note (Signed)
09/07/2012  1:14 PM  PATIENT:  Jason Norton  56 y.o. male  PRE-OPERATIVE DIAGNOSIS:  RIGHT HAND EXPANDING HEMATOMA  POST-OPERATIVE DIAGNOSIS: right hand hematoma  PROCEDURE:  Procedure(s) (LRB) with comments: IRRIGATION AND DEBRIDEMENT EXTREMITY (Right) AND EVACUATION OF HEMATOMA  SURGEON:  Surgeon(s) and Role:    * Sharma Covert, MD - Primary  PHYSICIAN ASSISTANT:   ASSISTANTS: none   ANESTHESIA:   general  EBL:     BLOOD ADMINISTERED:none  DRAINS: none   LOCAL MEDICATIONS USED:  MARCAINE     SPECIMEN:  No Specimen  DISPOSITION OF SPECIMEN:  N/A  COUNTS:  YES  TOURNIQUET:  * No tourniquets in log *  DICTATION: .[id number]  PLAN OF CARE: Admit for overnight observation  PATIENT DISPOSITION:  PACU - hemodynamically stable.   Delay start of Pharmacological VTE agent (>24hrs) due to surgical blood loss or risk of bleeding: not applicable

## 2012-09-08 MED ORDER — OXYCODONE HCL 5 MG PO TABS: 5.0000 mg | ORAL_TABLET | ORAL | Status: DC | PRN

## 2012-09-08 MED ORDER — CEPHALEXIN 500 MG PO CAPS: 500.0000 mg | ORAL_CAPSULE | Freq: Four times a day (QID) | ORAL | Status: DC

## 2012-09-08 NOTE — Discharge Summary (Signed)
Physician Discharge Summary  Patient ID: Jason Norton MRN: [id number] DOB/AGE: [birth year] 56 y.o.  Admit date: 09/07/2012 Discharge date: 09/08/2012  Admission Diagnoses: evac hematoma Past Medical History  Diagnosis Date  . PE (pulmonary embolism) 2005    hx post freq travel-2005  . DJD (degenerative joint disease)   . Gout   . Hyperlipemia   . Meningitis     at age 59 weeks old    Discharge Diagnoses:  Principal Problem:  *Hematoma, postoperative Active Problems:  GOUT  History of pulmonary embolism  Chronic anticoagulation   Surgeries: Procedure(s): IRRIGATION AND DEBRIDEMENT EXTREMITY on 09/07/2012    Consultants: Treatment Team:  Gaspar Garbe, MD  Discharged Condition: Improved  Hospital Course: Jason Norton is an 56 y.o. male who was admitted 09/07/2012 with a chief complaint of  Chief Complaint  Patient presents with  . Hand Pain  , and found to have a diagnosis of evac hematoma.  They were brought to the operating room on 09/07/2012 and underwent Procedure(s): IRRIGATION AND DEBRIDEMENT EXTREMITY.    They were given perioperative antibiotics: Anti-infectives     Start     Dose/Rate Route Frequency Ordered Stop   09/08/12 0000   cephALEXin (KEFLEX) 500 MG capsule        500 mg Oral 4 times daily 09/08/12 1124     09/07/12 2315   ceFAZolin (ANCEF) IVPB 1 g/50 mL premix        1 g 100 mL/hr over 30 Minutes Intravenous 3 times per day 09/07/12 1624     09/07/12 1630   ceFAZolin (ANCEF) IVPB 1 g/50 mL premix        1 g 100 mL/hr over 30 Minutes Intravenous NOW 09/07/12 1624 09/07/12 1749        .  They were given sequential compression devices, early ambulation, and AMBULATION for DVT prophylaxis.  Recent vital signs: Patient Vitals for the past 24 hrs:  BP Temp Temp src Pulse Resp SpO2 Height Weight  09/08/12 0635 124/65 mmHg 97.8 F (36.6 C) - 74  18  96 % - -  09/08/12 0030 112/58 mmHg 97.9 F (36.6 C) - 72  18  98 % - -  09/07/12 2327  97/65 mmHg 98.3 F (36.8 C) Oral 68  19  96 % - -  09/07/12 1630 126/84 mmHg - - 73  18  100 % 5\' 10"  (1.778 m) 90.266 kg (199 lb)  09/07/12 1500 135/93 mmHg 96.8 F (36 C) - - 12  98 % - -  09/07/12 1143 118/106 mmHg 98.6 F (37 C) Oral 78  26  97 % - -  .  Recent laboratory studies: No results found.  Discharge Medications:     Medication List     As of 09/08/2012 11:24 AM    STOP taking these medications         HYDROcodone-acetaminophen 5-500 MG per tablet   Commonly known as: VICODIN      TAKE these medications         allopurinol 300 MG tablet   Commonly known as: ZYLOPRIM   Take 300 mg by mouth daily.      cephALEXin 500 MG capsule   Commonly known as: KEFLEX   Take 1 capsule (500 mg total) by mouth 4 (four) times daily.      colchicine 0.6 MG tablet   Take 0.6 mg by mouth daily.      enoxaparin 150 MG/ML injection   Commonly known  as: LOVENOX   Inject 0.67 mLs (100 mg total) into the skin every 12 (twelve) hours.      finasteride 5 MG tablet   Commonly known as: PROSCAR   Take 1.25 mg by mouth daily.      oxyCODONE 5 MG immediate release tablet   Commonly known as: Oxy IR/ROXICODONE   Take 1 tablet (5 mg total) by mouth every 3 (three) hours as needed.      pyridOXINE 100 MG tablet   Commonly known as: VITAMIN B-6   Take 200 mg by mouth daily.      rosuvastatin 5 MG tablet   Commonly known as: CRESTOR   Take 5 mg by mouth daily.      vitamin B-12 1000 MCG tablet   Commonly known as: CYANOCOBALAMIN   Take 1,000 mcg by mouth daily.      vitamin C 1000 MG tablet   Take 1,000 mg by mouth daily.      warfarin 5 MG tablet   Commonly known as: COUMADIN   Take 1 tablet (5 mg total) by mouth daily. As directed      zolpidem 10 MG tablet   Commonly known as: AMBIEN   Take 10 mg by mouth at bedtime.        Diagnostic Studies: No results found.  They benefited maximally from their hospital stay and there were no complications.      Disposition: 01-Home or Self Care  PT SEEN EXAMINED TODAY DOING MUCH BETTER READY TO GO HOME CONTINUE GOUT MEDS ORAL ABX OK TO RESUME ANTICOAGULATION PT VOICED UNDERSTANDING AND PLAN      Follow-up Information    Follow up with Karen Chafe, MD. Schedule an appointment as soon as possible for a visit in 7 days.   Contact information:   [address] [place] [address] [place] [address] [phone]           Signed: Sharma Norton 09/08/2012, 11:24 AM

## 2012-09-08 NOTE — Op Note (Signed)
NAMECOSMO, TETREAULT NO.:  [id number]  MEDICAL RECORD NO.:  [id number]  LOCATION:  6N29C                        FACILITY:  [facility]  PHYSICIAN:  Madelynn Done, MD  DATE OF BIRTH:  Nov 17, 1955  DATE OF PROCEDURE:  09/07/2012 DATE OF DISCHARGE:                              OPERATIVE REPORT   PREOPERATIVE DIAGNOSIS:  Right hand status post carpal tunnel release with expanding hematoma.  POSTOPERATIVE DIAGNOSIS:  Right hand status post carpal tunnel release with expanding hematoma.  ATTENDING PHYSICIAN:  Madelynn Done, MD who scrubbed and present for the entire procedure.  ASSISTANT SURGEON:  None.  ANESTHESIA:  General via LMA.  TOURNIQUET TIME:  Less than 5 minutes at 250 mmHg.  SURGICAL PROCEDURE:  Right hand evacuation of hematoma, irrigation and debridement, right hand.  SURGICAL INDICATIONS:  Mr. Rosamaria Lints is a right-hand-dominant gentleman who had a carpal tunnel release done approximately 11 days ago.  The patient was into the ER with uncontrollable pain, intractable pain to IV and oral pain medications.  The patient was seen and evaluated based on the amount of pain that he was having in the expanding hematoma. It was recommended to undergo the above procedure.  Risks, benefits, and alternatives were discussed in detail with the patient and signed informed consent was obtained.  Risks include, but not limited to bleeding, infection, damage to nearby nerves, arteries, or tendons, loss of motion of wrist and digits, incomplete relief of symptoms, and need for further surgical intervention.  DESCRIPTION OF PROCEDURE:  The patient was appropriately identified brought in the preop holding area and mark with a permanent marker made on the right hand to indicate the correct operative site.  The patient was then brought back up to operating room, placed supine on the anesthesia room table.  General anesthesia was administered.  The patient tolerated  this well.  Well-padded tourniquet was then placed in a right brachium and sealed with 1000 drape.  The right upper extremity was then prepped and draped in normal sterile fashion.  Time-out was called.  Correct site was identified, and procedure then begun. Attention then turned to the right hand where the limb was then elevated and tourniquet inflated.  Previous incision was then lengthened just proximally.  Dissection was then carried down through the skin and subcutaneous tissue.  Upon opening up with the skin and subcutaneous tissues,  large amount of hematoma was then evacuated, kind of shut out almost like a volcano erupting.  This decompressed the hematoma that what was an expanding over the distal wrist crease.  The wound was then thoroughly irrigated.  After evacuation of the  hematoma, careful dissection was then carried out to inspect the nerve.  Did have the hematoma and an overlying it, but appear to be in good condition. Copious wound irrigation done throughout.  Tourniquet was then deflated. Careful attention was made to evaluate for any type of active bleeding. There was no active bleeding.  The wound was then copiously irrigated. Following this, the skin was then loosely reapproximated with simple Prolene sutures.  Adaptic dressing, sterile compressive bandage then applied.  The patient then placed in a short-arm  splint, extubated, and taken to recovery room in good condition.  POSTOPERATIVE PLAN:  The patient is being admitted overnight for IV antibiotics and pain control.  Place him back on his oral anti-gout medication, antibiotics, and pain medications.  Likely allow him to go home in the morning as long as his pain is controlled.  The patient tolerated this well and then will be followed up in the office with Dr. Amanda Pea for the postoperative carpal tunnel evaluation.     Madelynn Done, MD     FWO/MEDQ  D:  09/07/2012  T:  09/08/2012  Job:  [id number]

## 2012-09-08 NOTE — Progress Notes (Signed)
Discharge instructions gone over with patient. Home medications gone over. Prescriptions given to patient. Follow up appointments to be made. Diet, activity, and incisional care discussed. Signs and symptoms of worsening condition, infection, and when to call the doctor gone over. Patient verbalized understanding of instructions.

## 2012-09-09 ENCOUNTER — Encounter (HOSPITAL_COMMUNITY): Payer: Self-pay | Admitting: Orthopedic Surgery

## 2013-01-02 ENCOUNTER — Encounter: Payer: Self-pay | Admitting: Internal Medicine

## 2013-07-31 ENCOUNTER — Other Ambulatory Visit (HOSPITAL_COMMUNITY): Payer: Self-pay | Admitting: Internal Medicine

## 2013-07-31 ENCOUNTER — Ambulatory Visit (HOSPITAL_COMMUNITY)
Admission: RE | Admit: 2013-07-31 | Discharge: 2013-07-31 | Disposition: A | Discharge status: Home / Self Care | Payer: 59 | Source: Ambulatory Visit | Attending: Vascular Surgery | Admitting: Vascular Surgery

## 2013-07-31 DIAGNOSIS — H538 Other visual disturbances: Secondary | ICD-10-CM

## 2013-07-31 DIAGNOSIS — H539 Unspecified visual disturbance: Secondary | ICD-10-CM | POA: Insufficient documentation

## 2013-08-11 ENCOUNTER — Ambulatory Visit (INDEPENDENT_AMBULATORY_CARE_PROVIDER_SITE_OTHER): Payer: Self-pay | Admitting: General Surgery

## 2013-08-19 ENCOUNTER — Encounter (INDEPENDENT_AMBULATORY_CARE_PROVIDER_SITE_OTHER): Payer: Self-pay | Admitting: General Surgery

## 2013-08-19 ENCOUNTER — Ambulatory Visit (INDEPENDENT_AMBULATORY_CARE_PROVIDER_SITE_OTHER): Payer: Commercial Managed Care - PPO | Admitting: General Surgery

## 2013-08-19 ENCOUNTER — Encounter (INDEPENDENT_AMBULATORY_CARE_PROVIDER_SITE_OTHER): Payer: Self-pay

## 2013-08-19 VITALS — BP 130/78 | HR 74 | Temp 97.8°F | Resp 16 | Ht 70.0 in | Wt 183.8 lb

## 2013-08-19 DIAGNOSIS — K429 Umbilical hernia without obstruction or gangrene: Secondary | ICD-10-CM | POA: Insufficient documentation

## 2013-08-19 NOTE — Progress Notes (Signed)
Patient ID: DAISEAN BRODHEAD, male   DOB: 10-30-1955, 57 y.o.   MRN: [id number]  No chief complaint on file.   HPI KRYSTAL DELDUCA is a 57 y.o. male.  The patient is a 57 year old male who is referred by Dr. Patsey Berthold for evaluation of an umbilical hernia.  The patient states it has gotten slightly larger. He does state that he has a little bit of pain associated with it at times. The has not interfered with any overall activities at this time.  The patient has no signs or symptoms of incarceration or strangulation. HPI  Past Medical History  Diagnosis Date  . PE (pulmonary embolism) 2005    hx post freq travel-2005  . DJD (degenerative joint disease)   . Gout   . Hyperlipemia   . Meningitis     at age 25 weeks old  . Clotting disorder   . Carpal tunnel syndrome     Past Surgical History  Procedure Laterality Date  . Knee arthroplasty  1975    lt knee reconstruction hign [phone]  . Knee arthroscopy      x3  . Total knee arthroplasty  2007    rt  . Total knee revision  2011    rt  . Total hip arthroplasty  2012    left  . Wrist arthroscopy  2006    debrid-rt  . Back surgery      lumb lam  . Colonoscopy    . Carpal tunnel release  08/27/2012    Procedure: CARPAL TUNNEL RELEASE;  Surgeon: Dominica Severin, MD;  Location: [facility];  Service: Orthopedics;  Laterality: Right;  Right Limited Open Carpal Tunnel Release   . Steriod injection  08/27/2012    Procedure: STEROID INJECTION;  Surgeon: Dominica Severin, MD;  Location: [facility];  Service: Orthopedics;  Laterality: Left;  . I&d extremity  09/07/2012    Procedure: IRRIGATION AND DEBRIDEMENT EXTREMITY;  Surgeon: Sharma Covert, MD;  Location: [facility];  Service: Orthopedics;  Laterality: Right;    No family history on file.  Social History History  Substance Use Topics  . Smoking status: Never Smoker   . Smokeless tobacco: Never Used  . Alcohol Use: Yes     Comment: very rare    No Known  Allergies  Current Outpatient Prescriptions  Medication Sig Dispense Refill  . allopurinol (ZYLOPRIM) 300 MG tablet Take 300 mg by mouth daily.      . Ascorbic Acid (VITAMIN C) 1000 MG tablet Take 1,000 mg by mouth daily.      . colchicine 0.6 MG tablet Take 0.6 mg by mouth daily.      . finasteride (PROSCAR) 5 MG tablet Take 1.25 mg by mouth daily.      [name] pyridOXINE (VITAMIN B-6) 100 MG tablet Take 200 mg by mouth daily.      . Rivaroxaban (XARELTO PO) Take by mouth.      . rosuvastatin (CRESTOR) 5 MG tablet Take 5 mg by mouth daily.      . vitamin B-12 (CYANOCOBALAMIN) 1000 MCG tablet Take 1,000 mcg by mouth daily.      [name] warfarin (COUMADIN) 5 MG tablet Take 1 tablet (5 mg total) by mouth daily. As directed  30 tablet  0  . zolpidem (AMBIEN) 10 MG tablet Take 10 mg by mouth at bedtime.       . cephALEXin (KEFLEX) 500 MG capsule Take 1 capsule (500 mg total) by mouth 4 (four)  times daily.  28 capsule  0  . enoxaparin (LOVENOX) 150 MG/ML injection Inject 0.67 mLs (100 mg total) into the skin every 12 (twelve) hours.  6 Syringe  0  . oxyCODONE (OXY IR/ROXICODONE) 5 MG immediate release tablet Take 1 tablet (5 mg total) by mouth every 3 (three) hours as needed.  45 tablet  0   No current facility-administered medications for this visit.    Review of Systems Review of Systems  Constitutional: Negative.   HENT: Negative.   Eyes: Negative.   Respiratory: Negative.   Cardiovascular: Negative.   Gastrointestinal: Negative.   Endocrine: Negative.   Neurological: Negative.     Blood pressure 130/78, pulse 74, temperature 97.8 F (36.6 C), temperature source Temporal, resp. rate 16, height 5\' 10"  (1.778 m), weight 183 lb 12.8 oz (83.371 kg).  Physical Exam Physical Exam  Constitutional: He is oriented to person, place, and time. He appears well-developed and well-nourished.  HENT:  Head: Normocephalic and atraumatic.  Eyes: Conjunctivae and EOM are normal. Pupils are equal, round, and  reactive to light.  Neck: Normal range of motion. Neck supple.  Cardiovascular: Normal rate, regular rhythm and normal heart sounds.   Pulmonary/Chest: Effort normal and breath sounds normal.  Abdominal: A hernia is present. Hernia confirmed positive in the ventral area.    Musculoskeletal: Normal range of motion.  Neurological: He is alert and oriented to person, place, and time.  Skin: Skin is warm and dry.    Data Reviewed none  Assessment    57 year old male with an umbilical hernia     Plan    1. In discussion with the patient he would like to hold off on any umbilical hernia repair at this time. I believe there is any urgency to get this fixed. I discussed with him should this become more symptomatic or interfere with his daily activities and/or hobbies we can discuss repairing his hernia. 2. Patient is on Xarelto for a PE 2003. He would need to stop this prior to surgery. 2. Patient follow up as needed should he care for his umbilical hernia be repaired.        Marigene Ehlers., TRUE [place] 08/19/2013, 3:00 PM

## 2013-08-26 ENCOUNTER — Encounter (INDEPENDENT_AMBULATORY_CARE_PROVIDER_SITE_OTHER): Payer: Self-pay

## 2015-05-06 ENCOUNTER — Ambulatory Visit: Payer: Self-pay | Admitting: General Surgery

## 2015-05-06 NOTE — H&P (Signed)
History of Present Illness Ralene Ok MD; 05/06/2015 11:23 AM) Patient words: f/u hernia.  The patient is a 59 year old male who presents with an umbilical hernia.  Patient is a 59 year old male with a primary umbilical hernia. Patient has patient states his gotten larger.  Patient is on Elliquis secondary to PE. This being managed by Dr. Osborne Casco.    Other Problems Marjean Donna, CMA; 05/06/2015 11:13 AM) Arthritis Diverticulosis Hemorrhoids Hypercholesterolemia Umbilical Hernia Repair  Past Surgical History Marjean Donna, CMA; 05/06/2015 11:13 AM) Hip Surgery Left. Knee Surgery Right. Spinal Surgery - Lower Back  Diagnostic Studies History Marjean Donna, CMA; 05/06/2015 11:13 AM) Colonoscopy 5-10 years ago  Allergies Davy Pique Bynum, CMA; 05/06/2015 11:14 AM) No Known Drug Allergies08/25/2016  Medication History Davy Pique Bynum, CMA; 05/06/2015 11:19 AM) Eliquis (2.5MG  Tablet, Oral) Active. Colchicine (0.6MG  Capsule, Oral) Active. Proscar (5MG  Tablet, Oral) Active. Allopurinol (300MG  Tablet, Oral) Active. Vitamin C (1000MG  Tablet, Oral) Active. Vitamin B Complex (Oral) Active. Doxycycline Hyclate (50MG  Tablet DR, Oral) Active. Medications Reconciled  Social History Marjean Donna, CMA; 05/06/2015 11:13 AM) Alcohol use Occasional alcohol use. Caffeine use Coffee.  Review of Systems Ralene Ok MD; 05/06/2015 11:23 AM) General Not Present- Appetite Loss, Chills, Fatigue, Fever, Night Sweats, Weight Gain and Weight Loss. Skin Not Present- Change in Wart/Mole, Dryness, Hives, Jaundice, New Lesions, Non-Healing Wounds, Rash and Ulcer. HEENT Not Present- Earache, Hearing Loss, Hoarseness, Nose Bleed, Oral Ulcers, Ringing in the Ears, Seasonal Allergies, Sinus Pain, Sore Throat, Visual Disturbances, Wears glasses/contact lenses and Yellow Eyes. Respiratory Not Present- Bloody sputum, Chronic Cough, Difficulty Breathing, Snoring and Wheezing. Breast Not Present-  Breast Mass, Breast Pain, Nipple Discharge and Skin Changes. Cardiovascular Not Present- Chest Pain, Difficulty Breathing Lying Down, Leg Cramps, Palpitations, Rapid Heart Rate, Shortness of Breath and Swelling of Extremities. Gastrointestinal Not Present- Abdominal Pain, Bloating, Bloody Stool, Change in Bowel Habits, Chronic diarrhea, Constipation, Difficulty Swallowing, Excessive gas, Gets full quickly at meals, Hemorrhoids, Indigestion, Nausea, Rectal Pain and Vomiting. Male Genitourinary Not Present- Blood in Urine, Change in Urinary Stream, Frequency, Impotence, Nocturia, Painful Urination, Urgency and Urine Leakage. Musculoskeletal Not Present- Back Pain, Joint Pain, Joint Stiffness, Muscle Pain, Muscle Weakness and Swelling of Extremities. Neurological Not Present- Decreased Memory, Fainting, Headaches, Numbness, Seizures, Tingling, Tremor, Trouble walking and Weakness. Psychiatric Not Present- Anxiety, Bipolar, Change in Sleep Pattern, Depression, Fearful and Frequent crying. Endocrine Not Present- Cold Intolerance, Excessive Hunger, Hair Changes, Heat Intolerance, Hot flashes and New Diabetes. Hematology Not Present- Easy Bruising, Excessive bleeding, Gland problems, HIV and Persistent Infections.   Vitals (Sonya Bynum CMA; 05/06/2015 11:13 AM) 05/06/2015 11:13 AM Weight: 191 lb Height: 67.5in Body Surface Area: 2.03 m Body Mass Index: 29.47 kg/m Temp.: 97.34F(Temporal)  Pulse: 76 (Regular)  BP: 122/80 (Sitting, Left Arm, Standard)    Physical Exam Ralene Ok MD; 05/06/2015 11:24 AM) General Mental Status-Alert. General Appearance-Consistent with stated age. Hydration-Well hydrated. Voice-Normal.  Head and Neck Head-normocephalic, atraumatic with no lesions or palpable masses. Trachea-midline. Thyroid Gland Characteristics - normal size and consistency.  Chest and Lung Exam Chest and lung exam reveals -quiet, even and easy respiratory effort  with no use of accessory muscles and on auscultation, normal breath sounds, no adventitious sounds and normal vocal resonance. Inspection Chest Wall - Normal. Back - normal.  Cardiovascular Cardiovascular examination reveals -normal heart sounds, regular rate and rhythm with no murmurs and normal pedal pulses bilaterally.  Abdomen Inspection Skin - Scar - no surgical scars. Hernias - Umbilical hernia - Incarcerated(approx 1cm UH).  Palpation/Percussion Normal exam - Soft, Non Tender, No Rebound tenderness, No Rigidity (guarding) and No hepatosplenomegaly. Auscultation Normal exam - Bowel sounds normal.    Assessment & Plan Ralene Ok MD; 9/47/6546 50:35 AM) UMBILICAL HERNIA WITHOUT OBSTRUCTION AND WITHOUT GANGRENE (553.1  K42.9) Impression: 59 year old male with an umbilical hernia  1. The patient will like to proceed to the operating room for laparoscopic umbilical hernia repair with mesh..  2. I discussed with the patient the signs and symptoms of incarceration and strangulation and the need to proceed to the ER should they occur.  3. I discussed with the patient the risks and benefits of the procedure to include but not limited to: Infection, bleeding, damage to surrounding structures, possible need for further surgery, possible nerve pain, and possible recurrence. The patient was understanding and wishes to proceed.

## 2015-05-11 ENCOUNTER — Encounter (HOSPITAL_COMMUNITY): Payer: Self-pay | Admitting: *Deleted

## 2015-05-11 MED ORDER — CHLORHEXIDINE GLUCONATE 4 % EX LIQD: 1.0000 "application " | Freq: Once | CUTANEOUS | Status: DC

## 2015-05-11 MED ORDER — CEFAZOLIN SODIUM-DEXTROSE 2-3 GM-% IV SOLR
2.0000 g | INTRAVENOUS | Status: DC
  Filled 2015-05-11: qty 50

## 2015-05-11 NOTE — Progress Notes (Signed)
Pt denies SOB, chest pain, and being under the care of a cardiologist. Pt denies having an echo and cardiac cath but stated that a stress test was done more than 5 years ago by Dr. Gwenlyn Found, cardiology,  of [facility] and the results were " okay; " records requested. Pt stated, "my last dose of Eliquis was Sunday. " Pt made aware to stop taking  Aspirin, vitamins ( C and E) and herbal medications. Do not take any NSAIDs ie: Ibuprofen, Advil, Naproxen or any medication containing Aspirin. Pt verbalized understanding of all pre-op instructions.

## 2015-05-12 ENCOUNTER — Ambulatory Visit (HOSPITAL_COMMUNITY)
Admission: RE | Admit: 2015-05-12 | Discharge: 2015-05-12 | Disposition: A | Discharge status: Home / Self Care | Payer: 59 | Source: Ambulatory Visit | Attending: General Surgery | Admitting: General Surgery

## 2015-05-12 ENCOUNTER — Ambulatory Visit (HOSPITAL_COMMUNITY): Payer: 59 | Admitting: Anesthesiology

## 2015-05-12 ENCOUNTER — Encounter (HOSPITAL_COMMUNITY): Payer: Self-pay | Admitting: *Deleted

## 2015-05-12 ENCOUNTER — Encounter (HOSPITAL_COMMUNITY)
Admission: RE | Disposition: A | Discharge status: Home / Self Care | Payer: Self-pay | Source: Ambulatory Visit | Attending: General Surgery

## 2015-05-12 DIAGNOSIS — K429 Umbilical hernia without obstruction or gangrene: Secondary | ICD-10-CM | POA: Insufficient documentation

## 2015-05-12 DIAGNOSIS — Z87891 Personal history of nicotine dependence: Secondary | ICD-10-CM | POA: Diagnosis not present

## 2015-05-12 HISTORY — PX: UMBILICAL HERNIA REPAIR: SHX196

## 2015-05-12 HISTORY — DX: Umbilical hernia without obstruction or gangrene: K42.9

## 2015-05-12 HISTORY — PX: INSERTION OF MESH: SHX5868

## 2015-05-12 HISTORY — DX: Diverticulosis of intestine, part unspecified, without perforation or abscess without bleeding: K57.90

## 2015-05-12 LAB — CBC
HCT: 43.8 % (ref 39.0–52.0)
Hemoglobin: 14.8 g/dL (ref 13.0–17.0)
MCH: 29.5 pg (ref 26.0–34.0)
MCHC: 33.8 g/dL (ref 30.0–36.0)
MCV: 87.4 fL (ref 78.0–100.0)
PLATELETS: 141 10*3/uL — AB (ref 150–400)
RBC: 5.01 MIL/uL (ref 4.22–5.81)
RDW: 14 % (ref 11.5–15.5)
WBC: 6.6 10*3/uL (ref 4.0–10.5)

## 2015-05-12 LAB — PROTIME-INR
INR: 0.99 (ref 0.00–1.49)
PROTHROMBIN TIME: 13.3 s (ref 11.6–15.2)

## 2015-05-12 LAB — BASIC METABOLIC PANEL
Anion gap: 7 (ref 5–15)
BUN: 16 mg/dL (ref 6–20)
CALCIUM: 9.2 mg/dL (ref 8.9–10.3)
CO2: 23 mmol/L (ref 22–32)
CREATININE: 1.04 mg/dL (ref 0.61–1.24)
Chloride: 110 mmol/L (ref 101–111)
GFR calc Af Amer: >60 mL/min (ref >60)
Glucose, Bld: 101 mg/dL — ABNORMAL HIGH (ref 65–99)
POTASSIUM: 4.4 mmol/L (ref 3.5–5.1)
SODIUM: 140 mmol/L (ref 135–145)

## 2015-05-12 SURGERY — REPAIR, HERNIA, UMBILICAL, LAPAROSCOPIC
Anesthesia: General | Site: Abdomen

## 2015-05-12 MED ORDER — PROPOFOL 10 MG/ML IV BOLUS
INTRAVENOUS | Status: DC | PRN
  Administered 2015-05-12: 150 mg via INTRAVENOUS

## 2015-05-12 MED ORDER — 0.9 % SODIUM CHLORIDE (POUR BTL) OPTIME
TOPICAL | Status: DC | PRN
  Administered 2015-05-12: 1000 mL

## 2015-05-12 MED ORDER — MIDAZOLAM HCL 2 MG/2ML IJ SOLN
INTRAMUSCULAR | Status: AC
  Filled 2015-05-12: qty 4

## 2015-05-12 MED ORDER — LIDOCAINE HCL (CARDIAC) 20 MG/ML IV SOLN
INTRAVENOUS | Status: DC | PRN
  Administered 2015-05-12: 50 mg via INTRAVENOUS

## 2015-05-12 MED ORDER — HYDROMORPHONE HCL 1 MG/ML IJ SOLN
INTRAMUSCULAR | Status: DC
Start: 2015-05-12 — End: 2015-05-12
  Filled 2015-05-12: qty 1

## 2015-05-12 MED ORDER — LACTATED RINGERS IV SOLN
INTRAVENOUS | Status: DC | PRN
  Administered 2015-05-12: 11:00:00 via INTRAVENOUS

## 2015-05-12 MED ORDER — KETOROLAC TROMETHAMINE 30 MG/ML IJ SOLN
INTRAMUSCULAR | Status: AC
  Filled 2015-05-12: qty 1

## 2015-05-12 MED ORDER — GLYCOPYRROLATE 0.2 MG/ML IJ SOLN
INTRAMUSCULAR | Status: DC | PRN
  Administered 2015-05-12: 0.6 mg via INTRAVENOUS

## 2015-05-12 MED ORDER — PROMETHAZINE HCL 25 MG/ML IJ SOLN: 6.2500 mg | INTRAMUSCULAR | Status: DC | PRN

## 2015-05-12 MED ORDER — HYDROMORPHONE HCL 1 MG/ML IJ SOLN
INTRAMUSCULAR | Status: AC
  Filled 2015-05-12: qty 1

## 2015-05-12 MED ORDER — ROCURONIUM BROMIDE 100 MG/10ML IV SOLN
INTRAVENOUS | Status: DC | PRN
  Administered 2015-05-12: 40 mg via INTRAVENOUS

## 2015-05-12 MED ORDER — ACETAMINOPHEN 650 MG RE SUPP
650.0000 mg | RECTAL | Status: DC | PRN
  Filled 2015-05-12: qty 1

## 2015-05-12 MED ORDER — MEPERIDINE HCL 25 MG/ML IJ SOLN: 6.2500 mg | INTRAMUSCULAR | Status: DC | PRN

## 2015-05-12 MED ORDER — ONDANSETRON HCL 4 MG/2ML IJ SOLN
INTRAMUSCULAR | Status: DC | PRN
  Administered 2015-05-12: 4 mg via INTRAVENOUS

## 2015-05-12 MED ORDER — KETOROLAC TROMETHAMINE 30 MG/ML IJ SOLN
30.0000 mg | Freq: Once | INTRAMUSCULAR | Status: AC
  Administered 2015-05-12: 30 mg via INTRAVENOUS

## 2015-05-12 MED ORDER — NEOSTIGMINE METHYLSULFATE 10 MG/10ML IV SOLN
INTRAVENOUS | Status: DC | PRN
  Administered 2015-05-12: 4 mg via INTRAVENOUS

## 2015-05-12 MED ORDER — FENTANYL CITRATE (PF) 250 MCG/5ML IJ SOLN
INTRAMUSCULAR | Status: AC
  Filled 2015-05-12: qty 5

## 2015-05-12 MED ORDER — LACTATED RINGERS IV SOLN
Freq: Once | INTRAVENOUS | Status: AC
  Administered 2015-05-12: 10:00:00 via INTRAVENOUS

## 2015-05-12 MED ORDER — FENTANYL CITRATE (PF) 100 MCG/2ML IJ SOLN
INTRAMUSCULAR | Status: DC | PRN
  Administered 2015-05-12 (×6): 50 ug via INTRAVENOUS

## 2015-05-12 MED ORDER — ACETAMINOPHEN 10 MG/ML IV SOLN
INTRAVENOUS | Status: DC | PRN
  Administered 2015-05-12: 1000 mg via INTRAVENOUS

## 2015-05-12 MED ORDER — MIDAZOLAM HCL 5 MG/5ML IJ SOLN
INTRAMUSCULAR | Status: DC | PRN
  Administered 2015-05-12: 2 mg via INTRAVENOUS

## 2015-05-12 MED ORDER — OXYCODONE HCL 5 MG PO TABS: 5.0000 mg | ORAL_TABLET | ORAL | Status: DC | PRN

## 2015-05-12 MED ORDER — HYDROMORPHONE HCL 1 MG/ML IJ SOLN
0.5000 mg | INTRAMUSCULAR | Status: AC | PRN
  Administered 2015-05-12 (×4): 0.5 mg via INTRAVENOUS

## 2015-05-12 MED ORDER — HYDROMORPHONE HCL 1 MG/ML IJ SOLN
0.5000 mg | INTRAMUSCULAR | Status: DC | PRN
  Administered 2015-05-12: 0.5 mg via INTRAVENOUS

## 2015-05-12 MED ORDER — ACETAMINOPHEN 10 MG/ML IV SOLN
INTRAVENOUS | Status: AC
  Filled 2015-05-12: qty 100

## 2015-05-12 MED ORDER — BUPIVACAINE HCL 0.25 % IJ SOLN
INTRAMUSCULAR | Status: DC | PRN
  Administered 2015-05-12: 7 mL

## 2015-05-12 MED ORDER — ACETAMINOPHEN 325 MG PO TABS
650.0000 mg | ORAL_TABLET | ORAL | Status: DC | PRN
  Filled 2015-05-12: qty 2

## 2015-05-12 MED ORDER — OXYCODONE-ACETAMINOPHEN 5-325 MG PO TABS: 1.0000 | ORAL_TABLET | ORAL | Status: DC | PRN

## 2015-05-12 SURGICAL SUPPLY — 49 items
APPLIER CLIP LOGIC TI 5 (MISCELLANEOUS) IMPLANT
APPLIER CLIP ROT 10 11.4 M/L (STAPLE)
BENZOIN TINCTURE PRP APPL 2/3 (GAUZE/BANDAGES/DRESSINGS) ×3 IMPLANT
BLADE SURG ROTATE 9660 (MISCELLANEOUS) IMPLANT
CANISTER SUCTION 2500CC (MISCELLANEOUS) IMPLANT
CHLORAPREP W/TINT 26ML (MISCELLANEOUS) ×3 IMPLANT
CLIP APPLIE ROT 10 11.4 M/L (STAPLE) IMPLANT
CLOSURE WOUND 1/2 X4 (GAUZE/BANDAGES/DRESSINGS) ×1
COVER SURGICAL LIGHT HANDLE (MISCELLANEOUS) ×3 IMPLANT
DEVICE SECURE STRAP 25 ABSORB (INSTRUMENTS) ×3 IMPLANT
DEVICE TROCAR PUNCTURE CLOSURE (ENDOMECHANICALS) ×3 IMPLANT
DRAPE LAPAROSCOPIC ABDOMINAL (DRAPES) ×3 IMPLANT
ELECT REM PT RETURN 9FT ADLT (ELECTROSURGICAL) ×3
ELECTRODE REM PT RTRN 9FT ADLT (ELECTROSURGICAL) ×1 IMPLANT
GAUZE SPONGE 4X4 12PLY STRL (GAUZE/BANDAGES/DRESSINGS) ×3 IMPLANT
GLOVE BIO SURGEON STRL SZ 6.5 (GLOVE) ×2 IMPLANT
GLOVE BIO SURGEON STRL SZ7 (GLOVE) ×3 IMPLANT
GLOVE BIO SURGEON STRL SZ7.5 (GLOVE) ×3 IMPLANT
GLOVE BIO SURGEONS STRL SZ 6.5 (GLOVE) ×1
GLOVE BIOGEL PI IND STRL 7.0 (GLOVE) ×3 IMPLANT
GLOVE BIOGEL PI INDICATOR 7.0 (GLOVE) ×6
GLOVE ECLIPSE 6.5 STRL STRAW (GLOVE) ×3 IMPLANT
GOWN STRL REUS W/ TWL LRG LVL3 (GOWN DISPOSABLE) ×3 IMPLANT
GOWN STRL REUS W/ TWL XL LVL3 (GOWN DISPOSABLE) ×1 IMPLANT
GOWN STRL REUS W/TWL LRG LVL3 (GOWN DISPOSABLE) ×6
GOWN STRL REUS W/TWL XL LVL3 (GOWN DISPOSABLE) ×2
KIT BASIN OR (CUSTOM PROCEDURE TRAY) ×3 IMPLANT
KIT ROOM TURNOVER OR (KITS) ×3 IMPLANT
MARKER SKIN DUAL TIP RULER LAB (MISCELLANEOUS) ×3 IMPLANT
MESH VENTRALIGHT ST 4.5IN (Mesh General) ×3 IMPLANT
NEEDLE INSUFFLATION 14GA 120MM (NEEDLE) ×3 IMPLANT
NEEDLE SPNL 22GX3.5 QUINCKE BK (NEEDLE) IMPLANT
NS IRRIG 1000ML POUR BTL (IV SOLUTION) ×3 IMPLANT
PAD ARMBOARD 7.5X6 YLW CONV (MISCELLANEOUS) ×6 IMPLANT
SCISSORS LAP 5X35 DISP (ENDOMECHANICALS) ×3 IMPLANT
SET IRRIG TUBING LAPAROSCOPIC (IRRIGATION / IRRIGATOR) IMPLANT
SLEEVE ENDOPATH XCEL 5M (ENDOMECHANICALS) ×3 IMPLANT
STRIP CLOSURE SKIN 1/2X4 (GAUZE/BANDAGES/DRESSINGS) ×2 IMPLANT
SUT CHROMIC 2 0 SH (SUTURE) ×3 IMPLANT
SUT MNCRL AB 4-0 PS2 18 (SUTURE) ×3 IMPLANT
SUT PROLENE 2 0 KS (SUTURE) ×3 IMPLANT
TOWEL OR 17X24 6PK STRL BLUE (TOWEL DISPOSABLE) ×3 IMPLANT
TOWEL OR 17X26 10 PK STRL BLUE (TOWEL DISPOSABLE) ×3 IMPLANT
TRAY FOLEY CATH 14FR (SET/KITS/TRAYS/PACK) IMPLANT
TRAY LAPAROSCOPIC MC (CUSTOM PROCEDURE TRAY) ×3 IMPLANT
TROCAR XCEL BLUNT TIP 100MML (ENDOMECHANICALS) IMPLANT
TROCAR XCEL NON-BLD 11X100MML (ENDOMECHANICALS) IMPLANT
TROCAR XCEL NON-BLD 5MMX100MML (ENDOMECHANICALS) ×3 IMPLANT
TUBING INSUFFLATION (TUBING) ×3 IMPLANT

## 2015-05-12 NOTE — Op Note (Signed)
05/12/2015  12:39 PM  PATIENT:  Jason Norton  59 y.o. male  PRE-OPERATIVE DIAGNOSIS:  UMBILICAL HERNIA  POST-OPERATIVE DIAGNOSIS:  UMBILICAL HERNIA  PROCEDURE:  Procedure(s): LAPAROSCOPIC UMBILICAL HERNIA REPAIR (N/A) INSERTION OF MESH (N/A)  SURGEON:  Surgeon(s) and Role:    * Ralene Ok, MD - Primary  ANESTHESIA:   local and general  EBL:   5cc  BLOOD ADMINISTERED:none  DRAINS: none   LOCAL MEDICATIONS USED:  BUPIVICAINE   SPECIMEN:  No Specimen  DISPOSITION OF SPECIMEN: N/A  COUNTS:  YES  TOURNIQUET:  * No tourniquets in log *  DICTATION: .Dragon Dictation  Details of the procedure:   After the patient was consented patient was taken back to the operating room patient was then placed in supine position bilateral SCDs in place.  The patient was prepped and draped in the usual sterile fashion. After antibiotics were confirmed a timeout was called and all facts were verified. The Veress needle technique was used to insuflate the abdomen at Palmer's point. The abdomen was insufflated to 14 mm mercury. Subsequently a 5 mm trocar was placed a camera inserted there was no injury to any intra-abdominal organs.    There was seen to be a 1cm  Umbilical hernia.  A second camera port was in placed into the left lower quadrant.     I proceeded to reduce the hernia contents.  Once the hernia was cleared away, a Bard Ventralight 11.4cm  mesh was inserted into the abdomen.  The mesh was secured circumferentially with am Securestrap tacker in a double crown fashion.  2-0 prolenes were used at 3:00, 6:00, 9:00, and 12:00 as transfascial sutures using a endoclose decive.    The omentum was brought over the area of the mesh. The pneumoperitoneum was evacuated  & all trocars  were removed. The skin was reapproximated with 4-0  Monocryl sutures in a subcuticular fashion. The skin was dressed with Steri-Strips tape and gauze.  The patient was taken to the recovery room in stable  condition.   PLAN OF CARE: Discharge to home after PACU  PATIENT DISPOSITION:  PACU - hemodynamically stable.   Delay start of Pharmacological VTE agent (>24hrs) due to surgical blood loss or risk of bleeding: not applicable

## 2015-05-12 NOTE — Anesthesia Preprocedure Evaluation (Signed)
Anesthesia Evaluation  Patient identified by MRN, date of birth, ID band Patient awake    Reviewed: Allergy & Precautions, H&P , NPO status , Patient's Chart, lab work & pertinent test results  History of Anesthesia Complications (+) PONV  Airway Mallampati: I  TM Distance: >3 FB Neck ROM: Full    Dental no notable dental hx. (+) Teeth Intact, Dental Advisory Given   Pulmonary neg pulmonary ROS, former smoker, PE breath sounds clear to auscultation  Pulmonary exam normal       Cardiovascular negative cardio ROS Normal cardiovascular examRhythm:Regular Rate:Normal     Neuro/Psych negative neurological ROS     GI/Hepatic negative GI ROS, Neg liver ROS,   Endo/Other  negative endocrine ROS  Renal/GU      Musculoskeletal  (+) Arthritis -,   Abdominal (+)  Abdomen: soft. Bowel sounds: normal.  Peds  Hematology negative hematology ROS (+)   Anesthesia Other Findings   Reproductive/Obstetrics negative OB ROS                             Anesthesia Physical  Anesthesia Plan  ASA: II  Anesthesia Plan: General   Post-op Pain Management:    Induction: Intravenous  Airway Management Planned: Oral ETT  Additional Equipment:   Intra-op Plan:   Post-operative Plan: Extubation in OR  Informed Consent: I have reviewed the patients History and Physical, chart, labs and discussed the procedure including the risks, benefits and alternatives for the proposed anesthesia with the patient or authorized representative who has indicated his/her understanding and acceptance.   Dental advisory given  Plan Discussed with: CRNA and Surgeon  Anesthesia Plan Comments:         Anesthesia Quick Evaluation

## 2015-05-12 NOTE — Transfer of Care (Signed)
Immediate Anesthesia Transfer of Care Note  Patient: Jason Norton  Procedure(s) Performed: Procedure(s): LAPAROSCOPIC UMBILICAL HERNIA REPAIR (N/A) INSERTION OF MESH (N/A)  Patient Location: PACU  Anesthesia Type:General  Level of Consciousness: awake, alert  and oriented  Airway & Oxygen Therapy: Patient Spontanous Breathing and Patient connected to nasal cannula oxygen  Post-op Assessment: Report given to RN and Post -op Vital signs reviewed and stable  Post vital signs: Reviewed and stable  Last Vitals:  Filed Vitals:   05/12/15 0949  BP: 119/80  Pulse: 79  Temp: 36.3 C  Resp: 18    Complications: No apparent anesthesia complications

## 2015-05-12 NOTE — Discharge Instructions (Signed)
[facility] _______[facility] ° °UMBILICAL HERNIA REPAIR: POST OP INSTRUCTIONS ° °Always review your discharge instruction sheet given to you by the facility where your surgery was performed. °IF YOU HAVE DISABILITY OR FAMILY LEAVE FORMS, YOU MUST BRING THEM TO THE OFFICE FOR PROCESSING.   °DO NOT GIVE THEM TO YOUR DOCTOR. ° °1. A  prescription for pain medication may be given to you upon discharge.  Take your pain medication as prescribed, if needed.  If narcotic pain medicine is not needed, then you may take acetaminophen (Tylenol) or ibuprofen (Advil) as needed. °2. Take your usually prescribed medications unless otherwise directed. °3. If you need a refill on your pain medication, please contact your pharmacy.  They will contact our office to request authorization. Prescriptions will not be filled after 5 pm or on week-ends. °4. You should follow a light diet the first 24 hours after arrival home, such as soup and crackers, etc.  Be sure to include lots of fluids daily.  Resume your normal diet the day after surgery. °5. Most patients will experience some swelling and bruising around the umbilicus or in the groin and scrotum.  Ice packs and reclining will help.  Swelling and bruising can take several days to resolve.  °6. It is common to experience some constipation if taking pain medication after surgery.  Increasing fluid intake and taking a stool softener (such as Colace) will usually help or prevent this problem from occurring.  A mild laxative (Milk of Magnesia or Miralax) should be taken according to package directions if there are no bowel movements after 48 hours. °7. Unless discharge instructions indicate otherwise, you may remove your bandages 24-48 hours after surgery, and you may shower at that time.  You may have steri-strips (small skin tapes) in place directly over the incision.  These strips should be left on the skin for 7-10 days.  If your surgeon used skin glue on the incision, you  may shower in 24 hours.  The glue will flake off over the next 2-3 weeks.  Any sutures or staples will be removed at the office during your follow-up visit. °8. ACTIVITIES:  You may resume regular (light) daily activities beginning the next day--such as daily self-care, walking, climbing stairs--gradually increasing activities as tolerated.  You may have sexual intercourse when it is comfortable.  Refrain from any heavy lifting or straining until approved by your doctor. °a. You may drive when you are no longer taking prescription pain medication, you can comfortably wear a seatbelt, and you can safely maneuver your car and apply brakes. °b. RETURN TO WORK:  __________________________________________________________ °9. You should see your doctor in the office for a follow-up appointment approximately 2-3 weeks after your surgery.  Make sure that you call for this appointment within a day or two after you arrive home to insure a convenient appointment time. °10. OTHER INSTRUCTIONS:  __________________________________________________________________________________________________________________________________________________________________________________________  °WHEN TO CALL YOUR DOCTOR: °1. Fever over 101.0 °2. Inability to urinate °3. Nausea and/or vomiting °4. Extreme swelling or bruising °5. Continued bleeding from incision. °6. Increased pain, redness, or drainage from the incision ° °The clinic staff is available to answer your questions during regular business hours.  Please don’t hesitate to call and ask to speak to one of the nurses for clinical concerns.  If you have a medical emergency, go to the nearest emergency room or call 911.  A surgeon from [facility] is always on call at the hospital ° ° °[address], [address], [place], [place]  [address] ? ° [address] [address], , [place]   [address] °[phone] ? [phone] ? [phone] °Web site:  [email or web address] ° °

## 2015-05-12 NOTE — Anesthesia Postprocedure Evaluation (Signed)
Anesthesia Post Note  Patient: Jason Norton  Procedure(s) Performed: Procedure(s) (LRB): LAPAROSCOPIC UMBILICAL HERNIA REPAIR (N/A) INSERTION OF MESH (N/A)  Anesthesia type: General  Patient location: PACU  Post pain: Pain level controlled  Post assessment: Post-op Vital signs reviewed  Last Vitals: BP 111/70 mmHg  Pulse 60  Temp(Src) 36.3 C (Oral)  Resp 20  Ht 5\' 9"  (1.753 m)  Wt 189 lb (85.73 kg)  BMI 27.90 kg/m2  SpO2 98%  Post vital signs: Reviewed  Level of consciousness: sedated  Complications: No apparent anesthesia complications

## 2015-05-12 NOTE — Anesthesia Procedure Notes (Signed)
Procedure Name: Intubation Date/Time: 05/12/2015 11:54 AM Performed by: Clearnce Sorrel Pre-anesthesia Checklist: Patient identified, Emergency Drugs available, Suction available, Patient being monitored and Timeout performed Patient Re-evaluated:Patient Re-evaluated prior to inductionOxygen Delivery Method: Circle system utilized Preoxygenation: Pre-oxygenation with 100% oxygen Intubation Type: IV induction Ventilation: Mask ventilation without difficulty Laryngoscope Size: Mac and 3 Grade View: Grade III Tube type: Oral Tube size: 7.5 mm Number of attempts: 1 Airway Equipment and Method: Stylet Placement Confirmation: ETT inserted through vocal cords under direct vision,  positive ETCO2 and breath sounds checked- equal and bilateral Secured at: 23 cm Tube secured with: Tape Dental Injury: Teeth and Oropharynx as per pre-operative assessment

## 2015-05-13 ENCOUNTER — Encounter (HOSPITAL_COMMUNITY): Payer: Self-pay | Admitting: General Surgery

## 2015-10-25 DIAGNOSIS — M109 Gout, unspecified: Secondary | ICD-10-CM | POA: Diagnosis not present

## 2015-10-25 DIAGNOSIS — Z6829 Body mass index (BMI) 29.0-29.9, adult: Secondary | ICD-10-CM | POA: Diagnosis not present

## 2015-10-25 DIAGNOSIS — D6859 Other primary thrombophilia: Secondary | ICD-10-CM | POA: Diagnosis not present

## 2015-10-25 DIAGNOSIS — M1990 Unspecified osteoarthritis, unspecified site: Secondary | ICD-10-CM | POA: Diagnosis not present

## 2015-10-25 DIAGNOSIS — L649 Androgenic alopecia, unspecified: Secondary | ICD-10-CM | POA: Diagnosis not present

## 2015-10-25 DIAGNOSIS — K1321 Leukoplakia of oral mucosa, including tongue: Secondary | ICD-10-CM | POA: Diagnosis not present

## 2015-10-25 DIAGNOSIS — E7800 Pure hypercholesterolemia, unspecified: Secondary | ICD-10-CM | POA: Diagnosis not present

## 2015-10-25 DIAGNOSIS — N401 Enlarged prostate with lower urinary tract symptoms: Secondary | ICD-10-CM | POA: Diagnosis not present

## 2015-10-25 DIAGNOSIS — R945 Abnormal results of liver function studies: Secondary | ICD-10-CM | POA: Diagnosis not present

## 2016-04-28 DIAGNOSIS — Z125 Encounter for screening for malignant neoplasm of prostate: Secondary | ICD-10-CM | POA: Diagnosis not present

## 2016-04-28 DIAGNOSIS — M109 Gout, unspecified: Secondary | ICD-10-CM | POA: Diagnosis not present

## 2016-04-28 DIAGNOSIS — Z0000 Encounter for general adult medical examination without abnormal findings: Secondary | ICD-10-CM | POA: Diagnosis not present

## 2016-05-05 DIAGNOSIS — N401 Enlarged prostate with lower urinary tract symptoms: Secondary | ICD-10-CM | POA: Diagnosis not present

## 2016-05-05 DIAGNOSIS — E7800 Pure hypercholesterolemia, unspecified: Secondary | ICD-10-CM | POA: Diagnosis not present

## 2016-05-05 DIAGNOSIS — L649 Androgenic alopecia, unspecified: Secondary | ICD-10-CM | POA: Diagnosis not present

## 2016-05-05 DIAGNOSIS — D6859 Other primary thrombophilia: Secondary | ICD-10-CM | POA: Diagnosis not present

## 2016-05-05 DIAGNOSIS — M1990 Unspecified osteoarthritis, unspecified site: Secondary | ICD-10-CM | POA: Diagnosis not present

## 2016-05-05 DIAGNOSIS — Z6829 Body mass index (BMI) 29.0-29.9, adult: Secondary | ICD-10-CM | POA: Diagnosis not present

## 2016-05-05 DIAGNOSIS — B001 Herpesviral vesicular dermatitis: Secondary | ICD-10-CM | POA: Diagnosis not present

## 2016-05-05 DIAGNOSIS — Z1389 Encounter for screening for other disorder: Secondary | ICD-10-CM | POA: Diagnosis not present

## 2016-05-05 DIAGNOSIS — K1321 Leukoplakia of oral mucosa, including tongue: Secondary | ICD-10-CM | POA: Diagnosis not present

## 2017-05-02 DIAGNOSIS — M109 Gout, unspecified: Secondary | ICD-10-CM | POA: Diagnosis not present

## 2017-05-02 DIAGNOSIS — Z0000 Encounter for general adult medical examination without abnormal findings: Secondary | ICD-10-CM | POA: Diagnosis not present

## 2017-05-02 DIAGNOSIS — Z125 Encounter for screening for malignant neoplasm of prostate: Secondary | ICD-10-CM | POA: Diagnosis not present

## 2017-05-09 DIAGNOSIS — Z0000 Encounter for general adult medical examination without abnormal findings: Secondary | ICD-10-CM | POA: Diagnosis not present

## 2017-05-09 DIAGNOSIS — Z1389 Encounter for screening for other disorder: Secondary | ICD-10-CM | POA: Diagnosis not present

## 2017-05-09 DIAGNOSIS — Z86711 Personal history of pulmonary embolism: Secondary | ICD-10-CM | POA: Diagnosis not present

## 2017-05-09 DIAGNOSIS — N401 Enlarged prostate with lower urinary tract symptoms: Secondary | ICD-10-CM | POA: Diagnosis not present

## 2017-05-09 DIAGNOSIS — D6859 Other primary thrombophilia: Secondary | ICD-10-CM | POA: Diagnosis not present

## 2017-05-09 DIAGNOSIS — Z1212 Encounter for screening for malignant neoplasm of rectum: Secondary | ICD-10-CM | POA: Diagnosis not present

## 2017-05-09 DIAGNOSIS — L718 Other rosacea: Secondary | ICD-10-CM | POA: Diagnosis not present

## 2017-05-09 DIAGNOSIS — L648 Other androgenic alopecia: Secondary | ICD-10-CM | POA: Diagnosis not present

## 2017-05-09 DIAGNOSIS — E7800 Pure hypercholesterolemia, unspecified: Secondary | ICD-10-CM | POA: Diagnosis not present

## 2017-05-09 DIAGNOSIS — R7301 Impaired fasting glucose: Secondary | ICD-10-CM | POA: Diagnosis not present

## 2017-05-09 DIAGNOSIS — M109 Gout, unspecified: Secondary | ICD-10-CM | POA: Diagnosis not present

## 2017-05-09 DIAGNOSIS — M1990 Unspecified osteoarthritis, unspecified site: Secondary | ICD-10-CM | POA: Diagnosis not present

## 2017-07-30 DIAGNOSIS — H5203 Hypermetropia, bilateral: Secondary | ICD-10-CM | POA: Diagnosis not present

## 2017-11-01 DIAGNOSIS — R030 Elevated blood-pressure reading, without diagnosis of hypertension: Secondary | ICD-10-CM | POA: Diagnosis not present

## 2017-11-01 DIAGNOSIS — M545 Low back pain: Secondary | ICD-10-CM | POA: Diagnosis not present

## 2017-11-01 DIAGNOSIS — Z6827 Body mass index (BMI) 27.0-27.9, adult: Secondary | ICD-10-CM | POA: Diagnosis not present

## 2017-11-01 DIAGNOSIS — M4120 Other idiopathic scoliosis, site unspecified: Secondary | ICD-10-CM | POA: Diagnosis not present

## 2017-11-01 DIAGNOSIS — M5416 Radiculopathy, lumbar region: Secondary | ICD-10-CM | POA: Diagnosis not present

## 2017-11-05 DIAGNOSIS — M1990 Unspecified osteoarthritis, unspecified site: Secondary | ICD-10-CM | POA: Diagnosis not present

## 2017-11-05 DIAGNOSIS — N401 Enlarged prostate with lower urinary tract symptoms: Secondary | ICD-10-CM | POA: Diagnosis not present

## 2017-11-05 DIAGNOSIS — L648 Other androgenic alopecia: Secondary | ICD-10-CM | POA: Diagnosis not present

## 2017-11-05 DIAGNOSIS — L718 Other rosacea: Secondary | ICD-10-CM | POA: Diagnosis not present

## 2017-11-05 DIAGNOSIS — E7800 Pure hypercholesterolemia, unspecified: Secondary | ICD-10-CM | POA: Diagnosis not present

## 2017-11-05 DIAGNOSIS — D6859 Other primary thrombophilia: Secondary | ICD-10-CM | POA: Diagnosis not present

## 2017-11-05 DIAGNOSIS — K1321 Leukoplakia of oral mucosa, including tongue: Secondary | ICD-10-CM | POA: Diagnosis not present

## 2017-11-05 DIAGNOSIS — R945 Abnormal results of liver function studies: Secondary | ICD-10-CM | POA: Diagnosis not present

## 2017-11-05 DIAGNOSIS — R7301 Impaired fasting glucose: Secondary | ICD-10-CM | POA: Diagnosis not present

## 2017-11-06 ENCOUNTER — Other Ambulatory Visit (HOSPITAL_COMMUNITY): Payer: Self-pay | Admitting: Neurosurgery

## 2017-11-06 DIAGNOSIS — M5416 Radiculopathy, lumbar region: Secondary | ICD-10-CM

## 2017-11-14 ENCOUNTER — Ambulatory Visit (HOSPITAL_COMMUNITY)
Admission: RE | Admit: 2017-11-14 | Discharge: 2017-11-14 | Disposition: A | Discharge status: Home / Self Care | Payer: 59 | Source: Ambulatory Visit | Attending: Neurosurgery | Admitting: Neurosurgery

## 2017-11-14 DIAGNOSIS — M5116 Intervertebral disc disorders with radiculopathy, lumbar region: Secondary | ICD-10-CM | POA: Insufficient documentation

## 2017-11-14 DIAGNOSIS — M5416 Radiculopathy, lumbar region: Secondary | ICD-10-CM | POA: Diagnosis present

## 2017-11-14 DIAGNOSIS — M5126 Other intervertebral disc displacement, lumbar region: Secondary | ICD-10-CM | POA: Insufficient documentation

## 2017-11-14 DIAGNOSIS — M4807 Spinal stenosis, lumbosacral region: Secondary | ICD-10-CM | POA: Diagnosis not present

## 2017-11-14 DIAGNOSIS — M1288 Other specific arthropathies, not elsewhere classified, other specified site: Secondary | ICD-10-CM | POA: Insufficient documentation

## 2017-11-14 LAB — CREATININE, SERUM: CREATININE: 0.98 mg/dL (ref 0.61–1.24)

## 2017-11-14 MED ORDER — GADOBENATE DIMEGLUMINE 529 MG/ML IV SOLN
18.0000 mL | Freq: Once | INTRAVENOUS | Status: AC | PRN
  Administered 2017-11-14: 18 mL via INTRAVENOUS

## 2017-12-05 DIAGNOSIS — M5416 Radiculopathy, lumbar region: Secondary | ICD-10-CM | POA: Diagnosis not present

## 2017-12-05 DIAGNOSIS — M4120 Other idiopathic scoliosis, site unspecified: Secondary | ICD-10-CM | POA: Diagnosis not present

## 2017-12-05 DIAGNOSIS — M5126 Other intervertebral disc displacement, lumbar region: Secondary | ICD-10-CM | POA: Diagnosis not present

## 2017-12-05 DIAGNOSIS — M545 Low back pain: Secondary | ICD-10-CM | POA: Diagnosis not present

## 2017-12-10 ENCOUNTER — Other Ambulatory Visit: Payer: Self-pay | Admitting: Neurosurgery

## 2017-12-10 DIAGNOSIS — M5416 Radiculopathy, lumbar region: Secondary | ICD-10-CM

## 2017-12-28 ENCOUNTER — Ambulatory Visit
Admission: RE | Admit: 2017-12-28 | Discharge: 2017-12-28 | Disposition: A | Discharge status: Home / Self Care | Payer: 59 | Source: Ambulatory Visit | Attending: Neurosurgery | Admitting: Neurosurgery

## 2017-12-28 DIAGNOSIS — M47816 Spondylosis without myelopathy or radiculopathy, lumbar region: Secondary | ICD-10-CM | POA: Diagnosis not present

## 2017-12-28 DIAGNOSIS — M5416 Radiculopathy, lumbar region: Secondary | ICD-10-CM

## 2017-12-28 MED ORDER — METHYLPREDNISOLONE ACETATE 40 MG/ML INJ SUSP (RADIOLOG
120.0000 mg | Freq: Once | INTRAMUSCULAR | Status: AC
  Administered 2017-12-28: 120 mg via EPIDURAL

## 2017-12-28 MED ORDER — IOPAMIDOL (ISOVUE-M 200) INJECTION 41%
1.0000 mL | Freq: Once | INTRAMUSCULAR | Status: AC
  Administered 2017-12-28: 1 mL via EPIDURAL

## 2017-12-28 NOTE — Discharge Instructions (Signed)

## 2018-01-31 DIAGNOSIS — M545 Low back pain: Secondary | ICD-10-CM | POA: Diagnosis not present

## 2018-01-31 DIAGNOSIS — M5416 Radiculopathy, lumbar region: Secondary | ICD-10-CM | POA: Diagnosis not present

## 2018-01-31 DIAGNOSIS — M4120 Other idiopathic scoliosis, site unspecified: Secondary | ICD-10-CM | POA: Diagnosis not present

## 2018-01-31 DIAGNOSIS — M5126 Other intervertebral disc displacement, lumbar region: Secondary | ICD-10-CM | POA: Diagnosis not present

## 2018-01-31 DIAGNOSIS — Z6827 Body mass index (BMI) 27.0-27.9, adult: Secondary | ICD-10-CM | POA: Diagnosis not present

## 2018-05-08 DIAGNOSIS — Z6827 Body mass index (BMI) 27.0-27.9, adult: Secondary | ICD-10-CM | POA: Diagnosis not present

## 2018-05-08 DIAGNOSIS — M4120 Other idiopathic scoliosis, site unspecified: Secondary | ICD-10-CM | POA: Diagnosis not present

## 2018-05-08 DIAGNOSIS — R030 Elevated blood-pressure reading, without diagnosis of hypertension: Secondary | ICD-10-CM | POA: Diagnosis not present

## 2018-05-08 DIAGNOSIS — M5126 Other intervertebral disc displacement, lumbar region: Secondary | ICD-10-CM | POA: Diagnosis not present

## 2018-05-08 DIAGNOSIS — M545 Low back pain: Secondary | ICD-10-CM | POA: Diagnosis not present

## 2018-05-08 DIAGNOSIS — M5416 Radiculopathy, lumbar region: Secondary | ICD-10-CM | POA: Diagnosis not present

## 2018-05-15 DIAGNOSIS — Z0000 Encounter for general adult medical examination without abnormal findings: Secondary | ICD-10-CM | POA: Diagnosis not present

## 2018-05-15 DIAGNOSIS — R7301 Impaired fasting glucose: Secondary | ICD-10-CM | POA: Diagnosis not present

## 2018-05-15 DIAGNOSIS — Z125 Encounter for screening for malignant neoplasm of prostate: Secondary | ICD-10-CM | POA: Diagnosis not present

## 2018-05-15 DIAGNOSIS — R82998 Other abnormal findings in urine: Secondary | ICD-10-CM | POA: Diagnosis not present

## 2018-05-16 DIAGNOSIS — M109 Gout, unspecified: Secondary | ICD-10-CM | POA: Diagnosis not present

## 2018-05-17 ENCOUNTER — Other Ambulatory Visit: Payer: Self-pay | Admitting: Neurosurgery

## 2018-05-17 DIAGNOSIS — M5126 Other intervertebral disc displacement, lumbar region: Secondary | ICD-10-CM

## 2018-05-20 DIAGNOSIS — Z0000 Encounter for general adult medical examination without abnormal findings: Secondary | ICD-10-CM | POA: Diagnosis not present

## 2018-05-20 DIAGNOSIS — E7800 Pure hypercholesterolemia, unspecified: Secondary | ICD-10-CM | POA: Diagnosis not present

## 2018-05-20 DIAGNOSIS — N401 Enlarged prostate with lower urinary tract symptoms: Secondary | ICD-10-CM | POA: Diagnosis not present

## 2018-05-20 DIAGNOSIS — R7301 Impaired fasting glucose: Secondary | ICD-10-CM | POA: Diagnosis not present

## 2018-05-20 DIAGNOSIS — Z86711 Personal history of pulmonary embolism: Secondary | ICD-10-CM | POA: Diagnosis not present

## 2018-05-20 DIAGNOSIS — B001 Herpesviral vesicular dermatitis: Secondary | ICD-10-CM | POA: Diagnosis not present

## 2018-05-20 DIAGNOSIS — Z1389 Encounter for screening for other disorder: Secondary | ICD-10-CM | POA: Diagnosis not present

## 2018-05-20 DIAGNOSIS — M109 Gout, unspecified: Secondary | ICD-10-CM | POA: Diagnosis not present

## 2018-05-20 DIAGNOSIS — K1321 Leukoplakia of oral mucosa, including tongue: Secondary | ICD-10-CM | POA: Diagnosis not present

## 2018-05-20 DIAGNOSIS — D6859 Other primary thrombophilia: Secondary | ICD-10-CM | POA: Diagnosis not present

## 2018-05-31 ENCOUNTER — Ambulatory Visit
Admission: RE | Admit: 2018-05-31 | Discharge: 2018-05-31 | Disposition: A | Discharge status: Home / Self Care | Payer: 59 | Source: Ambulatory Visit | Attending: Neurosurgery | Admitting: Neurosurgery

## 2018-05-31 DIAGNOSIS — M5126 Other intervertebral disc displacement, lumbar region: Secondary | ICD-10-CM

## 2018-05-31 NOTE — Discharge Instructions (Signed)
Spinal Injection Discharge Instruction Sheet ° °1. You may resume a regular diet and any medications that you routinely take, including pain medications. ° °2. No driving the rest of the day of the procedure. ° °3. Light activity throughout the rest of the day.  Do not do any strenuous work, exercise, bending or lifting.  The day following the procedure, you may resume normal physical activity but you should refrain from exercising or physical therapy for at least three days. ° ° °Common Side Effects: ° °· Headaches- take your usual medications as directed by your physician.   ° °· Restlessness or inability to sleep- you may have trouble sleeping for the next few days.  Ask your referring physician if you need any medication for sleep if over the counter sleep medications do not help. ° °· Facial flushing or redness- this should subside within a few days. ° °· Increased pain- a temporary increase in pain a day or two following your procedure is not unusual.  Take your pain medication as prescribed by your referring physician.  You may use ice to the injection site as needed.  Please do not use heat for 24 hours. ° °· Leg cramps ° °Please contact our office at [phone] for the following symptoms: °· Fever greater than 100 degrees. °· Headaches unresolved with medication after 2-3 days. °· Increased swelling, pain, or redness at injection site. ° °Thank you for visiting our office. ° ° °You may resume Eliquis today. °

## 2018-06-06 ENCOUNTER — Encounter: Payer: Self-pay | Admitting: Nurse Practitioner

## 2018-06-06 ENCOUNTER — Encounter: Payer: Self-pay | Admitting: Internal Medicine

## 2018-06-19 ENCOUNTER — Ambulatory Visit: Payer: 59 | Admitting: Nurse Practitioner

## 2018-06-19 ENCOUNTER — Encounter: Payer: Self-pay | Admitting: Nurse Practitioner

## 2018-06-19 ENCOUNTER — Telehealth: Payer: Self-pay

## 2018-06-19 ENCOUNTER — Encounter: Payer: Self-pay | Admitting: Internal Medicine

## 2018-06-19 VITALS — BP 110/80 | HR 78 | Ht 69.5 in | Wt 188.0 lb

## 2018-06-19 DIAGNOSIS — Z7901 Long term (current) use of anticoagulants: Secondary | ICD-10-CM

## 2018-06-19 DIAGNOSIS — Z86010 Personal history of colonic polyps: Secondary | ICD-10-CM

## 2018-06-19 MED ORDER — NA SULFATE-K SULFATE-MG SULF 17.5-3.13-1.6 GM/177ML PO SOLN: ORAL | 0 refills | Status: DC

## 2018-06-19 NOTE — Patient Instructions (Addendum)
If you are age 61 or older, your body mass index should be between 23-30. Your Body mass index is 27.36 kg/m. If this is out of the aforementioned range listed, please consider follow up with your Primary Care Provider.  If you are age 25 or younger, your body mass index should be between 19-25. Your Body mass index is 27.36 kg/m. If this is out of the aformentioned range listed, please consider follow up with your Primary Care Provider.   You have been scheduled for a colonoscopy. Please follow written instructions given to you at your visit today.  Please pick up your prep supplies at the pharmacy within the next 1-3 days. If you use inhalers (even only as needed), please bring them with you on the day of your procedure. Your physician has requested that you go to www.startemmi.com and enter the access code given to you at your visit today. This web site gives a general overview about your procedure. However, you should still follow specific instructions given to you by our office regarding your preparation for the procedure.  We have sent the following medications to your pharmacy for you to pick up at your convenience: [place] will be contacted by our office prior to your procedure for directions on holding your Plavix.  If you do not hear from our office 1 week prior to your scheduled procedure, please call [phone] to discuss.   Thank you for choosing me and [facility].   Tye Savoy, NP

## 2018-06-19 NOTE — Telephone Encounter (Signed)
[facility] [address] [place], [place]  [address] Phone:  [phone]   Fax:  [phone]  06/19/2018   RE:      Jason Norton DOB:   [birth year] MRN:   [id number]   Dear Dr. Osborne Casco,    We have scheduled the above patient for an endoscopic procedure. Our records show that he is on anticoagulation therapy.   Please advise as to whether the patient may come off his therapy of Eliquis two days prior to the colonoscopy procedure, which is scheduled for 07/02/18.  Please fax back/ or route to [place] at [phone].   Sincerely,    Thurmon Fair, RMA

## 2018-06-19 NOTE — Progress Notes (Signed)
Assessment and plan reviewed 

## 2018-06-19 NOTE — Progress Notes (Signed)
Chief Complaint: colon cancer screening     ASSESSMENT AND PLAN;   41.  62 year old male for colon cancer screening.  He had a colonoscopy in 2008 with removal of 2 polyps (unable to locate pathology).  We sent him a colonoscopy recall letter in 2014 but he was unable to make it due to some health problems and caring for his mother with pancreatic cancer -No alarm features such as bowel changes or blood in stool.  Patient will be scheduled for colonoscopy possible polypectomy. The risks and benefits of colonoscopy with possible polypectomy were discussed and the patient agrees to proceed.   2.  History of PE, remote.  He is maintained on Eliquis. -Hold Eliquis for 2 days before procedure - will instruct when and how to resume after procedure. Patient understands that there is a low but real risk of cardiovascular event such as heart attack, stroke, or embolism /  thrombosis while off blood thinner. The patient consents to proceed. Will communicate by phone or EMR with patient's prescribing provider to confirm that holding Eliquis is reasonable in this case.   3. Rosacea, on chronic bactrim    HPI:    Patient is a 62 year old male known remotely to Dr. Henrene Pastor.  He had 2 polyps removed on last colonoscopy in 2008.  Unable to locate pathology report.  We sent him a colonoscopy recall letter in 2014.  Patient was unable to come for colonoscopy as he was having postop problems with knee surgeries and caring for mother with pancreatic cancer.  He now comes in to discuss colonoscopy.  Patient has no bowel changes, blood in stool or unexplained weight loss.  He feels well he has been on Eliquis for years for history of PE probably sustained from traveling.  Patient has no chest pain, shortness of breath or other general medical complaints.   Past Medical History:  Diagnosis Date  . Carpal tunnel syndrome   . Clotting disorder ([place])   . Diverticulosis   . DJD (degenerative joint  disease)   . Gout   . Hyperlipemia   . Meningitis    at age 50 weeks old  . PE (pulmonary embolism) 2005   hx post freq travel-2005  . Umbilical hernia     Past Surgical History:  Procedure Laterality Date  . BACK SURGERY     lumb lam  . CARPAL TUNNEL RELEASE  08/27/2012   Procedure: CARPAL TUNNEL RELEASE;  Surgeon: Roseanne Kaufman, MD;  Location: [facility];  Service: Orthopedics;  Laterality: Right;  Right Limited Open Carpal Tunnel Release   . COLONOSCOPY    . I&D EXTREMITY  09/07/2012   Procedure: IRRIGATION AND DEBRIDEMENT EXTREMITY;  Surgeon: Linna Hoff, MD;  Location: [facility];  Service: Orthopedics;  Laterality: Right;  . INSERTION OF MESH N/A 05/12/2015   Procedure: INSERTION OF MESH;  Surgeon: Ralene Ok, MD;  Location: [facility];  Service: General;  Laterality: N/A;  . KNEE ARTHROPLASTY  1975   lt knee reconstruction hign [id number]  . KNEE ARTHROSCOPY     x3  . STERIOD INJECTION  08/27/2012   Procedure: STEROID INJECTION;  Surgeon: Roseanne Kaufman, MD;  Location: [facility];  Service: Orthopedics;  Laterality: Left;  . TOTAL HIP ARTHROPLASTY  2012   left  . TOTAL KNEE ARTHROPLASTY  2007   rt  . TOTAL KNEE REVISION  2011   rt  .  UMBILICAL HERNIA REPAIR N/A 05/12/2015   Procedure: LAPAROSCOPIC UMBILICAL HERNIA REPAIR;  Surgeon: Ralene Ok, MD;  Location: [facility];  Service: General;  Laterality: N/A;  . WRIST ARTHROSCOPY  2006   debrid-rt   History reviewed. No pertinent family history. Social History   Tobacco Use  . Smoking status: Former Smoker    Types: Pipe  . Smokeless tobacco: Never Used  . Tobacco comment: " quit a couple of years ago"( 05/11/15)  Substance Use Topics  . Alcohol use: Yes    Comment: occasional  . Drug use: No   Current Outpatient Medications  Medication Sig Dispense Refill  . allopurinol (ZYLOPRIM) 300 MG tablet Take 300 mg by mouth daily.    [name] apixaban (ELIQUIS) 5 MG TABS tablet Take 5 mg by mouth 2  (two) times daily.    . Ascorbic Acid (VITAMIN C) 1000 MG tablet Take 1,000 mg by mouth daily.    . colchicine 0.6 MG tablet Take 0.6 mg by mouth daily.    . finasteride (PROSCAR) 5 MG tablet Take 2.5 mg by mouth daily.     [name] oxyCODONE-acetaminophen (ROXICET) 5-325 MG per tablet Take 1-2 tablets by mouth every 4 (four) hours as needed. 30 tablet 0  . pyridOXINE (VITAMIN B-6) 100 MG tablet Take 100 mg by mouth daily.     . rosuvastatin (CRESTOR) 5 MG tablet Take 5 mg by mouth daily.    [name] sulfamethoxazole-trimethoprim (BACTRIM DS,SEPTRA DS) 800-160 MG tablet Take 1 tablet by mouth 2 (two) times daily.  5  . vitamin B-12 (CYANOCOBALAMIN) 1000 MCG tablet Take 1,000 mcg by mouth 2 (two) times daily.     . vitamin E (VITAMIN E) 400 UNIT capsule Take 400 Units by mouth daily.     No current facility-administered medications for this visit.    No Known Allergies   Review of Systems: All systems reviewed and negative except where noted in HPI.   Creatinine clearance cannot be calculated (Patient's most recent lab result is older than the maximum 21 days allowed.)   Physical Exam:    Wt Readings from Last 3 Encounters:  06/19/18 188 lb (85.3 kg)  05/12/15 189 lb (85.7 kg)  08/19/13 183 lb 12.8 oz (83.4 kg)    BP 110/80   Pulse 78   Ht 5' 9.5" (1.765 m)   Wt 188 lb (85.3 kg)   BMI 27.36 kg/m  Constitutional:  Pleasant male in no acute distress. Psychiatric: Normal mood and affect. Behavior is normal. EENT: Pupils normal.  Conjunctivae are normal. No scleral icterus. Neck supple.  Cardiovascular: Normal rate, regular rhythm. No edema Pulmonary/chest: Effort normal and breath sounds normal. No wheezing, rales or rhonchi. Abdominal: Soft, nondistended, nontender. Bowel sounds active throughout. There are no masses palpable. No hepatomegaly. Neurological: Alert and oriented to person place and time. Skin: Skin is warm and dry. No rashes noted.  Tye Savoy, NP  06/19/2018, 11:06  AM  Cc: Tisovec, Fransico Him, MD

## 2018-06-27 ENCOUNTER — Telehealth: Payer: Self-pay

## 2018-06-27 NOTE — Telephone Encounter (Signed)
Patient advised to HOLD Eliquis two days prior to colonoscopy per Dr. Loren Racer instructions.  Patient verbalized understanding.

## 2018-07-02 ENCOUNTER — Encounter: Payer: Self-pay | Admitting: Internal Medicine

## 2018-07-02 ENCOUNTER — Ambulatory Visit (AMBULATORY_SURGERY_CENTER): Payer: 59 | Admitting: Internal Medicine

## 2018-07-02 VITALS — BP 110/77 | HR 62 | Temp 98.0°F | Resp 16 | Ht 69.5 in | Wt 188.0 lb

## 2018-07-02 DIAGNOSIS — D122 Benign neoplasm of ascending colon: Secondary | ICD-10-CM | POA: Diagnosis not present

## 2018-07-02 DIAGNOSIS — K635 Polyp of colon: Secondary | ICD-10-CM | POA: Diagnosis not present

## 2018-07-02 DIAGNOSIS — Z860100 Personal history of colon polyps, unspecified: Secondary | ICD-10-CM

## 2018-07-02 DIAGNOSIS — D120 Benign neoplasm of cecum: Secondary | ICD-10-CM

## 2018-07-02 MED ORDER — SODIUM CHLORIDE 0.9 % IV SOLN: 500.0000 mL | Freq: Once | INTRAVENOUS | Status: DC

## 2018-07-02 NOTE — Op Note (Signed)
[facility] Patient Name: Oluwadamilola Rosamond Procedure Date: 07/02/2018 9:19 AM MRN: [id number] Endoscopist: Docia Chuck. Henrene Pastor , MD Age: 62 Referring MD:  Date of Birth: [birth year] Gender: Male Account #: [id number] Procedure:                Colonoscopy cold snare polypectomy x 1 Indications:              High risk colon cancer surveillance: Personal                            history of non-advanced adenoma. Previous exam                            (index) 2008 Medicines:                Monitored Anesthesia Care Procedure:                Pre-Anesthesia Assessment:                           - Prior to the procedure, a History and Physical                            was performed, and patient medications and                            allergies were reviewed. The patient's tolerance of                            previous anesthesia was also reviewed. The risks                            and benefits of the procedure and the sedation                            options and risks were discussed with the patient.                            All questions were answered, and informed consent                            was obtained. Prior Anticoagulants: The patient has                            taken Eliquis (apixaban), last dose was 3 days                            prior to procedure. ASA Grade Assessment: III - A                            patient with severe systemic disease. After                            reviewing the risks and benefits, the patient was  deemed in satisfactory condition to undergo the                            procedure.                           After obtaining informed consent, the colonoscope                            was passed under direct vision. Throughout the                            procedure, the patient's blood pressure, pulse, and                            oxygen saturations were monitored continuously. The             Colonoscope was introduced through the anus and                            advanced to the the cecum, identified by                            appendiceal orifice and ileocecal valve. The                            ileocecal valve, appendiceal orifice, and rectum                            were photographed. The quality of the bowel                            preparation was excellent. The colonoscopy was                            performed without difficulty. The patient tolerated                            the procedure well. The bowel preparation used was                            SUPREP. Scope In: 9:33:24 AM Scope Out: 9:47:00 AM Scope Withdrawal Time: 0 hours 11 minutes 14 seconds  Total Procedure Duration: 0 hours 13 minutes 36 seconds  Findings:                 A 5 mm polyp was found in the ascending colon. The                            polyp was sessile. The polyp was removed with a                            cold snare. Resection and retrieval were complete.  Multiple diverticula were found in the sigmoid                            colon.                           Internal hemorrhoids were found during retroflexion.                           The exam was otherwise without abnormality on                            direct and retroflexion views. Complications:            No immediate complications. Estimated blood loss:                            None. Estimated Blood Loss:     Estimated blood loss: none. Impression:               - One 5 mm polyp in the ascending colon, removed                            with a cold snare. Resected and retrieved.                           - Diverticulosis in the sigmoid colon.                           - Internal hemorrhoids.                           - The examination was otherwise normal on direct                            and retroflexion views. Recommendation:           - Repeat colonoscopy in 5 years  for surveillance.                           - Patient has a contact number available for                            emergencies. The signs and symptoms of potential                            delayed complications were discussed with the                            patient. Return to normal activities tomorrow.                            Written discharge instructions were provided to the                            patient.                           -  Resume previous diet.                           - Continue present medications.                           - Resume Eliquis today                           - Await pathology results. Docia Chuck. Henrene Pastor, MD 07/02/2018 9:52:02 AM This report has been signed electronically.

## 2018-07-02 NOTE — Patient Instructions (Signed)
YOU HAD AN ENDOSCOPIC PROCEDURE TODAY AT [facility]:   Refer to the procedure report that was given to you for any specific questions about what was found during the examination.  If the procedure report does not answer your questions, please call your gastroenterologist to clarify.  If you requested that your care partner not be given the details of your procedure findings, then the procedure report has been included in a sealed envelope for you to review at your convenience later.  YOU SHOULD EXPECT: Some feelings of bloating in the abdomen. Passage of more gas than usual.  Walking can help get rid of the air that was put into your GI tract during the procedure and reduce the bloating. If you had a lower endoscopy (such as a colonoscopy or flexible sigmoidoscopy) you may notice spotting of blood in your stool or on the toilet paper. If you underwent a bowel prep for your procedure, you may not have a normal bowel movement for a few days.  Please Note:  You might notice some irritation and congestion in your nose or some drainage.  This is from the oxygen used during your procedure.  There is no need for concern and it should clear up in a day or so.  SYMPTOMS TO REPORT IMMEDIATELY:   Following lower endoscopy (colonoscopy or flexible sigmoidoscopy):  Excessive amounts of blood in the stool  Significant tenderness or worsening of abdominal pains  Swelling of the abdomen that is new, acute  Fever of 100F or higher  For urgent or emergent issues, a gastroenterologist can be reached at any hour by calling [phone].   DIET:  We do recommend a small meal at first, but then you may proceed to your regular diet.  Drink plenty of fluids but you should avoid alcoholic beverages for 24 hours.  MEDICATIONS: Continue present medications. Resume Eliquis today.  Please see handouts given to you by your recovery nurse.  ACTIVITY:  You should plan to take it easy for the rest of  today and you should NOT DRIVE or use heavy machinery until tomorrow (because of the sedation medicines used during the test).    FOLLOW UP: Our staff will call the number listed on your records the next business day following your procedure to check on you and address any questions or concerns that you may have regarding the information given to you following your procedure. If we do not reach you, we will leave a message.  However, if you are feeling well and you are not experiencing any problems, there is no need to return our call.  We will assume that you have returned to your regular daily activities without incident.  If any biopsies were taken you will be contacted by phone or by letter within the next 1-3 weeks.  Please call us at [phone] if you have not heard about the biopsies in 3 weeks.   Thank you for allowing [place] to provide for your healthcare needs today.  SIGNATURES/CONFIDENTIALITY: You and/or your care partner have signed paperwork which will be entered into your electronic medical record.  These signatures attest to the fact that that the information above on your After Visit Summary has been reviewed and is understood.  Full responsibility of the confidentiality of this discharge information lies with you and/or your care-partner.

## 2018-07-02 NOTE — Progress Notes (Signed)
Called to room to assist during endoscopic procedure.  Patient ID and intended procedure confirmed with present staff. Received instructions for my participation in the procedure from the performing physician.  

## 2018-07-03 ENCOUNTER — Telehealth: Payer: Self-pay | Admitting: *Deleted

## 2018-07-03 ENCOUNTER — Telehealth: Payer: Self-pay

## 2018-07-03 NOTE — Telephone Encounter (Signed)
No answer, left message to call if questions or concerns. 

## 2018-07-03 NOTE — Telephone Encounter (Signed)
  Follow up Call-  Call back number 07/02/2018  Post procedure Call Back phone  # [phone]  Permission to leave phone message Yes  Some recent data might be hidden     Patient questions:  Do you have a fever, pain , or abdominal swelling? No. Pain Score  0 *  Have you tolerated food without any problems? Yes.    Have you been able to return to your normal activities? Yes.    Do you have any questions about your discharge instructions: Diet   No. Medications  No. Follow up visit  No.  Do you have questions or concerns about your Care? No.  Actions: * If pain score is 4 or above: No action needed, pain <4.

## 2018-07-09 ENCOUNTER — Encounter: Payer: Self-pay | Admitting: Internal Medicine

## 2018-09-24 DIAGNOSIS — Z23 Encounter for immunization: Secondary | ICD-10-CM | POA: Diagnosis not present

## 2019-01-16 DIAGNOSIS — D126 Benign neoplasm of colon, unspecified: Secondary | ICD-10-CM | POA: Diagnosis not present

## 2019-01-16 DIAGNOSIS — D6859 Other primary thrombophilia: Secondary | ICD-10-CM | POA: Diagnosis not present

## 2019-01-16 DIAGNOSIS — R7301 Impaired fasting glucose: Secondary | ICD-10-CM | POA: Diagnosis not present

## 2019-01-16 DIAGNOSIS — Z86711 Personal history of pulmonary embolism: Secondary | ICD-10-CM | POA: Diagnosis not present

## 2019-01-16 DIAGNOSIS — Z1331 Encounter for screening for depression: Secondary | ICD-10-CM | POA: Diagnosis not present

## 2019-01-16 DIAGNOSIS — L719 Rosacea, unspecified: Secondary | ICD-10-CM | POA: Diagnosis not present

## 2019-01-16 DIAGNOSIS — E7800 Pure hypercholesterolemia, unspecified: Secondary | ICD-10-CM | POA: Diagnosis not present

## 2019-01-16 DIAGNOSIS — R945 Abnormal results of liver function studies: Secondary | ICD-10-CM | POA: Diagnosis not present

## 2019-05-15 DIAGNOSIS — Z23 Encounter for immunization: Secondary | ICD-10-CM | POA: Diagnosis not present

## 2019-05-15 DIAGNOSIS — M109 Gout, unspecified: Secondary | ICD-10-CM | POA: Diagnosis not present

## 2019-05-15 DIAGNOSIS — E7800 Pure hypercholesterolemia, unspecified: Secondary | ICD-10-CM | POA: Diagnosis not present

## 2019-05-15 DIAGNOSIS — Z0000 Encounter for general adult medical examination without abnormal findings: Secondary | ICD-10-CM | POA: Diagnosis not present

## 2019-05-15 DIAGNOSIS — R7301 Impaired fasting glucose: Secondary | ICD-10-CM | POA: Diagnosis not present

## 2019-05-15 DIAGNOSIS — Z125 Encounter for screening for malignant neoplasm of prostate: Secondary | ICD-10-CM | POA: Diagnosis not present

## 2019-05-22 DIAGNOSIS — D126 Benign neoplasm of colon, unspecified: Secondary | ICD-10-CM | POA: Diagnosis not present

## 2019-05-22 DIAGNOSIS — L719 Rosacea, unspecified: Secondary | ICD-10-CM | POA: Diagnosis not present

## 2019-05-22 DIAGNOSIS — L649 Androgenic alopecia, unspecified: Secondary | ICD-10-CM | POA: Diagnosis not present

## 2019-05-22 DIAGNOSIS — Z86711 Personal history of pulmonary embolism: Secondary | ICD-10-CM | POA: Diagnosis not present

## 2019-05-22 DIAGNOSIS — D6859 Other primary thrombophilia: Secondary | ICD-10-CM | POA: Diagnosis not present

## 2019-05-22 DIAGNOSIS — K1321 Leukoplakia of oral mucosa, including tongue: Secondary | ICD-10-CM | POA: Diagnosis not present

## 2019-05-22 DIAGNOSIS — R7301 Impaired fasting glucose: Secondary | ICD-10-CM | POA: Diagnosis not present

## 2019-05-22 DIAGNOSIS — M1990 Unspecified osteoarthritis, unspecified site: Secondary | ICD-10-CM | POA: Diagnosis not present

## 2019-05-22 DIAGNOSIS — Z1339 Encounter for screening examination for other mental health and behavioral disorders: Secondary | ICD-10-CM | POA: Diagnosis not present

## 2019-05-22 DIAGNOSIS — Z0000 Encounter for general adult medical examination without abnormal findings: Secondary | ICD-10-CM | POA: Diagnosis not present

## 2019-05-23 ENCOUNTER — Other Ambulatory Visit: Payer: Self-pay | Admitting: Internal Medicine

## 2019-05-23 ENCOUNTER — Other Ambulatory Visit (HOSPITAL_COMMUNITY): Payer: Self-pay | Admitting: Internal Medicine

## 2019-05-23 DIAGNOSIS — R945 Abnormal results of liver function studies: Secondary | ICD-10-CM

## 2019-05-23 DIAGNOSIS — R7989 Other specified abnormal findings of blood chemistry: Secondary | ICD-10-CM

## 2019-05-23 DIAGNOSIS — R109 Unspecified abdominal pain: Secondary | ICD-10-CM

## 2019-05-27 DIAGNOSIS — R82998 Other abnormal findings in urine: Secondary | ICD-10-CM | POA: Diagnosis not present

## 2019-06-03 ENCOUNTER — Ambulatory Visit (HOSPITAL_COMMUNITY)
Admission: RE | Admit: 2019-06-03 | Discharge: 2019-06-03 | Disposition: A | Discharge status: Home / Self Care | Payer: 59 | Source: Ambulatory Visit | Attending: Internal Medicine | Admitting: Internal Medicine

## 2019-06-03 ENCOUNTER — Other Ambulatory Visit: Payer: Self-pay

## 2019-06-03 DIAGNOSIS — R109 Unspecified abdominal pain: Secondary | ICD-10-CM | POA: Diagnosis not present

## 2019-06-03 DIAGNOSIS — R7989 Other specified abnormal findings of blood chemistry: Secondary | ICD-10-CM

## 2019-06-03 DIAGNOSIS — R945 Abnormal results of liver function studies: Secondary | ICD-10-CM | POA: Diagnosis not present

## 2019-06-04 DIAGNOSIS — Z1212 Encounter for screening for malignant neoplasm of rectum: Secondary | ICD-10-CM | POA: Diagnosis not present

## 2019-07-02 DIAGNOSIS — L814 Other melanin hyperpigmentation: Secondary | ICD-10-CM | POA: Diagnosis not present

## 2019-07-02 DIAGNOSIS — L819 Disorder of pigmentation, unspecified: Secondary | ICD-10-CM | POA: Diagnosis not present

## 2019-07-02 DIAGNOSIS — D229 Melanocytic nevi, unspecified: Secondary | ICD-10-CM | POA: Diagnosis not present

## 2019-07-02 DIAGNOSIS — L821 Other seborrheic keratosis: Secondary | ICD-10-CM | POA: Diagnosis not present

## 2019-07-02 DIAGNOSIS — L738 Other specified follicular disorders: Secondary | ICD-10-CM | POA: Diagnosis not present

## 2019-08-18 DIAGNOSIS — M79645 Pain in left finger(s): Secondary | ICD-10-CM | POA: Diagnosis not present

## 2019-08-18 DIAGNOSIS — M65312 Trigger thumb, left thumb: Secondary | ICD-10-CM | POA: Diagnosis not present

## 2019-09-08 DIAGNOSIS — Z4789 Encounter for other orthopedic aftercare: Secondary | ICD-10-CM | POA: Diagnosis not present

## 2019-09-08 DIAGNOSIS — M65312 Trigger thumb, left thumb: Secondary | ICD-10-CM | POA: Diagnosis not present

## 2019-09-22 DIAGNOSIS — M25642 Stiffness of left hand, not elsewhere classified: Secondary | ICD-10-CM | POA: Diagnosis not present

## 2019-11-09 ENCOUNTER — Other Ambulatory Visit: Payer: Self-pay

## 2019-11-09 ENCOUNTER — Emergency Department (HOSPITAL_COMMUNITY)
Admission: EM | Admit: 2019-11-09 | Discharge: 2019-11-10 | Disposition: A | Discharge status: Home / Self Care | Payer: 59 | Attending: Emergency Medicine | Admitting: Emergency Medicine

## 2019-11-09 ENCOUNTER — Emergency Department (HOSPITAL_COMMUNITY): Payer: 59

## 2019-11-09 DIAGNOSIS — Z86718 Personal history of other venous thrombosis and embolism: Secondary | ICD-10-CM | POA: Diagnosis not present

## 2019-11-09 DIAGNOSIS — R079 Chest pain, unspecified: Secondary | ICD-10-CM | POA: Diagnosis not present

## 2019-11-09 DIAGNOSIS — F17210 Nicotine dependence, cigarettes, uncomplicated: Secondary | ICD-10-CM | POA: Diagnosis not present

## 2019-11-09 DIAGNOSIS — Z96652 Presence of left artificial knee joint: Secondary | ICD-10-CM | POA: Insufficient documentation

## 2019-11-09 DIAGNOSIS — Z79899 Other long term (current) drug therapy: Secondary | ICD-10-CM | POA: Insufficient documentation

## 2019-11-09 DIAGNOSIS — R072 Precordial pain: Secondary | ICD-10-CM | POA: Insufficient documentation

## 2019-11-09 DIAGNOSIS — R0789 Other chest pain: Secondary | ICD-10-CM | POA: Diagnosis present

## 2019-11-09 DIAGNOSIS — Z7901 Long term (current) use of anticoagulants: Secondary | ICD-10-CM | POA: Diagnosis not present

## 2019-11-09 LAB — BASIC METABOLIC PANEL
Anion gap: 10 (ref 5–15)
BUN: 20 mg/dL (ref 8–23)
CO2: 25 mmol/L (ref 22–32)
Calcium: 9.2 mg/dL (ref 8.9–10.3)
Chloride: 105 mmol/L (ref 98–111)
Creatinine, Ser: 1.1 mg/dL (ref 0.61–1.24)
GFR calc Af Amer: >60 mL/min (ref >60)
GFR calc non Af Amer: >60 mL/min (ref >60)
Glucose, Bld: 122 mg/dL — ABNORMAL HIGH (ref 70–99)
Potassium: 3.9 mmol/L (ref 3.5–5.1)
Sodium: 140 mmol/L (ref 135–145)

## 2019-11-09 LAB — CBC
HCT: 45.4 % (ref 39.0–52.0)
Hemoglobin: 15.2 g/dL (ref 13.0–17.0)
MCH: 29.4 pg (ref 26.0–34.0)
MCHC: 33.5 g/dL (ref 30.0–36.0)
MCV: 87.8 fL (ref 80.0–100.0)
Platelets: 191 10*3/uL (ref 150–400)
RBC: 5.17 MIL/uL (ref 4.22–5.81)
RDW: 14.1 % (ref 11.5–15.5)
WBC: 9.7 10*3/uL (ref 4.0–10.5)
nRBC: 0 % (ref 0.0–0.2)

## 2019-11-09 LAB — TROPONIN I (HIGH SENSITIVITY): Troponin I (High Sensitivity): 3 ng/L (ref <18)

## 2019-11-09 MED ORDER — SODIUM CHLORIDE 0.9% FLUSH: 3.0000 mL | Freq: Once | INTRAVENOUS | Status: DC

## 2019-11-09 NOTE — ED Triage Notes (Signed)
Pt reports non-radiating, stabbing pain over L chest area for 1.5 hrs.. Pt has hx of PE to L lung "18 years ago".

## 2019-11-10 DIAGNOSIS — Z96652 Presence of left artificial knee joint: Secondary | ICD-10-CM | POA: Diagnosis not present

## 2019-11-10 DIAGNOSIS — Z86718 Personal history of other venous thrombosis and embolism: Secondary | ICD-10-CM | POA: Diagnosis not present

## 2019-11-10 DIAGNOSIS — F17210 Nicotine dependence, cigarettes, uncomplicated: Secondary | ICD-10-CM | POA: Diagnosis not present

## 2019-11-10 DIAGNOSIS — Z79899 Other long term (current) drug therapy: Secondary | ICD-10-CM | POA: Diagnosis not present

## 2019-11-10 DIAGNOSIS — Z7901 Long term (current) use of anticoagulants: Secondary | ICD-10-CM | POA: Diagnosis not present

## 2019-11-10 DIAGNOSIS — R072 Precordial pain: Secondary | ICD-10-CM | POA: Diagnosis not present

## 2019-11-10 LAB — D-DIMER, QUANTITATIVE: D-Dimer, Quant: <0.27 ug/mL-FEU (ref 0.00–0.50)

## 2019-11-10 LAB — TROPONIN I (HIGH SENSITIVITY): Troponin I (High Sensitivity): 3 ng/L (ref <18)

## 2019-11-10 MED ORDER — ASPIRIN 81 MG PO CHEW: 324.0000 mg | CHEWABLE_TABLET | Freq: Once | ORAL | Status: DC

## 2019-11-10 NOTE — ED Provider Notes (Signed)
[facility] EMERGENCY DEPARTMENT Provider Note   CSN: [id number] Arrival date & time: 11/09/19  2141     History Chief Complaint  Patient presents with  . Chest Pain    Jason Norton is a 64 y.o. male.  HPI  HPI: A 64 year old patient with a history of hypercholesterolemia presents for evaluation of chest pain. Initial onset of pain was approximately 1-3 hours ago. The patient's chest pain is described as heaviness/pressure/tightness, is sharp and is not worse with exertion. The patient's chest pain is middle- or left-sided, is not well-localized and does not radiate to the arms/jaw/neck. The patient does not complain of nausea and denies diaphoresis. The patient has no history of stroke, has no history of peripheral artery disease, has not smoked in the past 90 days, denies any history of treated diabetes, has no relevant family history of coronary artery disease (first degree relative at less than age 66), is not hypertensive and does not have an elevated BMI (>=30).  Patient with history of hyperlipidemia, distant history of PE, on Eliquis presents with chest pain.  He reports earlier in the evening, he began having sharp chest pain that did not radiate in his left chest. He also reports associated chest tightness.  No pleuritic pain. No fever/vomiting/shortness of breath.  No diaphoresis.  He does not recall having this pain previously. He is otherwise been feeling well recently.  His pain is improving.  Nothing seems to worsen his symptoms.  He does report having multiple episodes since it started earlier in the night Past Medical History:  Diagnosis Date  . Carpal tunnel syndrome   . Clotting disorder ([place])   . Diverticulosis   . DJD (degenerative joint disease)   . Gout   . Hyperlipemia   . Meningitis    at age 36 weeks old  . PE (pulmonary embolism) 2005   hx post freq travel-2005  . Umbilical hernia     Patient Active Problem List   Diagnosis Date  Noted  . Umbilical hernia [id number]  . Hematoma, postoperative 09/07/2012  . Chronic anticoagulation 09/07/2012  . GOUT 10/25/2007  . History of pulmonary embolism 10/25/2007  . DIARRHEA 10/25/2007  . PERSONAL HISTORY OF ARTHRITIS 10/25/2007    Past Surgical History:  Procedure Laterality Date  . BACK SURGERY     lumb lam  . CARPAL TUNNEL RELEASE  08/27/2012   Procedure: CARPAL TUNNEL RELEASE;  Surgeon: Roseanne Kaufman, MD;  Location: [place];  Service: Orthopedics;  Laterality: Right;  Right Limited Open Carpal Tunnel Release   . COLONOSCOPY    . I & D EXTREMITY  09/07/2012   Procedure: IRRIGATION AND DEBRIDEMENT EXTREMITY;  Surgeon: Linna Hoff, MD;  Location: [place];  Service: Orthopedics;  Laterality: Right;  . INSERTION OF MESH N/A 05/12/2015   Procedure: INSERTION OF MESH;  Surgeon: Ralene Ok, MD;  Location: [place];  Service: General;  Laterality: N/A;  . KNEE ARTHROPLASTY  1975   lt knee reconstruction hign [id number]  . KNEE ARTHROSCOPY     x3  . STERIOD INJECTION  08/27/2012   Procedure: STEROID INJECTION;  Surgeon: Roseanne Kaufman, MD;  Location: [place];  Service: Orthopedics;  Laterality: Left;  . TOTAL HIP ARTHROPLASTY  2012   left  . TOTAL KNEE ARTHROPLASTY  2007   rt  . TOTAL KNEE REVISION  2011   rt  . UMBILICAL HERNIA REPAIR N/A 05/12/2015   Procedure: LAPAROSCOPIC UMBILICAL HERNIA REPAIR;  Surgeon: Ralene Ok, MD;  Location: [place];  Service: General;  Laterality: N/A;  . WRIST ARTHROSCOPY  2006   debrid-rt       Family History  Adopted: Yes    Social History   Tobacco Use  . Smoking status: Former Smoker    Types: Pipe  . Smokeless tobacco: Never Used  . Tobacco comment: " quit a couple of years ago"( 05/11/15)  Substance Use Topics  . Alcohol use: Yes    Comment: occasional  . Drug use: No    Home Medications Prior to Admission medications   Medication Sig Start Date End Date Taking? Authorizing  Provider  allopurinol (ZYLOPRIM) 300 MG tablet Take 300 mg by mouth daily.   Yes [provider]  apixaban (ELIQUIS) 5 MG TABS tablet Take 5 mg by mouth 2 (two) times daily.   Yes [provider]  Ascorbic Acid (VITAMIN C) 1000 MG tablet Take 1,000 mg by mouth daily.   Yes [provider]  cholecalciferol (VITAMIN D3) 25 MCG (1000 UNIT) tablet Take 1,000 Units by mouth daily.   Yes [provider]  colchicine 0.6 MG tablet Take 0.6 mg by mouth daily as needed (gout).    Yes [provider]  doxycycline (VIBRA-TABS) 100 MG tablet Take 100 mg by mouth 2 (two) times daily. 10/31/19  Yes [provider]  finasteride (PROSCAR) 5 MG tablet Take 2.5 mg by mouth daily.    Yes [provider]  methocarbamol (ROBAXIN) 500 MG tablet Take 500 mg by mouth 2 (two) times daily as needed for muscle spasms.  09/24/19  Yes [provider]  rosuvastatin (CRESTOR) 5 MG tablet Take 5 mg by mouth daily.   Yes [provider]  vitamin B-12 (CYANOCOBALAMIN) 1000 MCG tablet Take 1,000 mcg by mouth 2 (two) times daily.    Yes [provider]  vitamin E (VITAMIN E) 400 UNIT capsule Take 400 Units by mouth daily.   Yes [provider]  zinc gluconate 50 MG tablet Take 50 mg by mouth daily.   Yes [provider]    Allergies    Sulfa antibiotics  Review of Systems   Review of Systems  Constitutional: Negative for diaphoresis and fever.  Respiratory: Negative for cough and shortness of breath.   Cardiovascular: Positive for chest pain.  Gastrointestinal: Negative for vomiting.  All other systems reviewed and are negative.   Physical Exam Updated Vital Signs BP 135/88   Pulse 69   Temp 98.1 F (36.7 C) (Oral)   Resp 17   Ht 1.753 m (5\' 9" )   Wt 92.1 kg   SpO2 97%   BMI 29.98 kg/m   Physical Exam CONSTITUTIONAL: Well developed/well nourished HEAD: Normocephalic/atraumatic EYES: EOMI/PERRL ENMT:  Mucous membranes moist NECK: supple no meningeal signs SPINE/BACK:entire spine nontender CV: S1/S2 noted, no murmurs/rubs/gallops noted LUNGS: Lungs are clear to auscultation bilaterally, no apparent distress ABDOMEN: soft, nontender, no rebound or guarding, bowel sounds noted throughout abdomen GU:no cva tenderness NEURO: Pt is awake/alert/appropriate, moves all extremitiesx4.  No facial droop.   EXTREMITIES: pulses normal/equalx4, full ROM SKIN: warm, color normal PSYCH: no abnormalities of mood noted, alert and oriented to situation  ED Results / Procedures / Treatments   Labs (all labs ordered are listed, but only abnormal results are displayed) Labs Reviewed  BASIC METABOLIC PANEL - Abnormal; Notable for the following components:      Result Value   Glucose, Bld 122 (*)    All  other components within normal limits  CBC  D-DIMER, QUANTITATIVE (NOT AT [facility])  TROPONIN I (HIGH SENSITIVITY)  TROPONIN I (HIGH SENSITIVITY)    EKG EKG Interpretation  Date/Time:  Monday November 10 2019 00:00:02 EST Ventricular Rate:  67 PR Interval:  188 QRS Duration: 80 QT Interval:  377 QTC Calculation: 398 R Axis:   4 Text Interpretation: Sinus rhythm Low voltage, precordial leads Abnormal R-wave progression, early transition Interpretation limited secondary to artifact Confirmed by Ripley Fraise [phone]) on 11/10/2019 1:02:21 AM   EKG Interpretation  Date/Time:  Monday November 10 2019 03:38:30 EST Ventricular Rate:  73 PR Interval:  188 QRS Duration: 82 QT Interval:  388 QTC Calculation: 428 R Axis:   18 Text Interpretation: Sinus rhythm Low voltage, precordial leads Abnormal R-wave progression, early transition Confirmed by Ripley Fraise [phone]) on 11/10/2019 4:02:28 AM        Radiology DG Chest 2 View  Result Date: 11/09/2019 CLINICAL DATA:  Left-sided chest pain for several hours EXAM: CHEST - 2 VIEW COMPARISON:  03/17/2011 FINDINGS: Cardiac shadow is stable. The lungs are well  aerated bilaterally. No focal infiltrate or sizable effusion is seen. Mild bibasilar atelectatic changes are noted. No bony abnormality is seen. IMPRESSION: Mild bibasilar atelectasis. Electronically Signed   By: Inez Catalina M.D.   On: 11/09/2019 22:23    Procedures Procedures  Medications Ordered in ED Medications  sodium chloride flush (NS) 0.9 % injection 3 mL (has no administration in time range)    ED Course  I have reviewed the triage vital signs and the nursing notes.  Pertinent labs & imaging results that were available during my care of the patient were reviewed by me and considered in my medical decision making (see chart for details).    MDM Rules/Calculators/A&P HEAR Score: 3                    1:19 AM Patient with previous history of PE presents with sharp chest pain.  He also reports chest tightness.  Initial EKG does not reveal STEMI, initial troponin negative.  We will need to explore for signs of a PE due to previous history.  He does not recall missing any recent doses of Eliquis.  Patient awake alert in no acute distress this time.  Discussed the case also with his wife via phone 4:30 AM Patient monitored for several hours without any worsening pain.  No acute EKG changes on multiple repeats.  Repeat troponin unchanged.  Heart score 3, with negative troponins D-dimer is negative. At this point I have low suspicion for ACS/PE/dissection Patient overall is well-appearing.  Patient feels comfortable for discharge home and will establish with outpatient cardiologist for potential stress test. Discussed the case with patient and his wife via phone. Final Clinical Impression(s) / ED Diagnoses Final diagnoses:  Precordial pain    Rx / DC Orders ED Discharge Orders    None       Ripley Fraise, MD 11/10/19 [phone]

## 2019-11-10 NOTE — Discharge Instructions (Addendum)

## 2019-11-21 DIAGNOSIS — Z86711 Personal history of pulmonary embolism: Secondary | ICD-10-CM | POA: Diagnosis not present

## 2019-11-21 DIAGNOSIS — L719 Rosacea, unspecified: Secondary | ICD-10-CM | POA: Diagnosis not present

## 2019-11-21 DIAGNOSIS — N401 Enlarged prostate with lower urinary tract symptoms: Secondary | ICD-10-CM | POA: Diagnosis not present

## 2019-11-21 DIAGNOSIS — B001 Herpesviral vesicular dermatitis: Secondary | ICD-10-CM | POA: Diagnosis not present

## 2019-11-21 DIAGNOSIS — L649 Androgenic alopecia, unspecified: Secondary | ICD-10-CM | POA: Diagnosis not present

## 2019-11-21 DIAGNOSIS — M109 Gout, unspecified: Secondary | ICD-10-CM | POA: Diagnosis not present

## 2019-11-21 DIAGNOSIS — K1321 Leukoplakia of oral mucosa, including tongue: Secondary | ICD-10-CM | POA: Diagnosis not present

## 2019-11-21 DIAGNOSIS — E7800 Pure hypercholesterolemia, unspecified: Secondary | ICD-10-CM | POA: Diagnosis not present

## 2019-11-21 DIAGNOSIS — R079 Chest pain, unspecified: Secondary | ICD-10-CM | POA: Diagnosis not present

## 2019-12-15 ENCOUNTER — Ambulatory Visit: Payer: 59 | Admitting: Internal Medicine

## 2020-01-05 ENCOUNTER — Other Ambulatory Visit (HOSPITAL_COMMUNITY): Payer: Self-pay | Admitting: Internal Medicine

## 2020-01-06 NOTE — Progress Notes (Signed)
Referring-Richard Tisovec MD Reason for referral-chest pain  HPI: 64 year old male for evaluation of chest pain at request of Domenick Gong MD.  Abdominal ultrasound September 2020 showed no aneurysm.  Patient seen in the emergency room February 2021 with chest pain.  Chest x-ray without acute abnormality.  Enzymes and D-dimer negative.  Hemoglobin 15.2.  In February patient had an episode of chest pain in the left upper chest area described as a squeezing sensation.  The pain did not radiate.  There was dyspnea but no nausea or diaphoresis.  The pain was not pleuritic or positional.  Resolved spontaneously after 15 minutes.  He has had some slight substernal chest pain since then.  He denies exertional chest pain, dyspnea on exertion, orthopnea, PND or pedal edema.  Cardiology now asked to evaluate.  Current Outpatient Medications  Medication Sig Dispense Refill  . allopurinol (ZYLOPRIM) 300 MG tablet Take 300 mg by mouth daily.    [name] apixaban (ELIQUIS) 5 MG TABS tablet Take 5 mg by mouth 2 (two) times daily.    . Ascorbic Acid (VITAMIN C) 1000 MG tablet Take 1,000 mg by mouth daily.    . cholecalciferol (VITAMIN D3) 25 MCG (1000 UNIT) tablet Take 1,000 Units by mouth daily.    . colchicine 0.6 MG tablet Take 0.6 mg by mouth daily as needed (gout).     [name] doxycycline (VIBRA-TABS) 100 MG tablet Take 100 mg by mouth 2 (two) times daily.    . finasteride (PROSCAR) 5 MG tablet Take 2.5 mg by mouth daily.     . methocarbamol (ROBAXIN) 500 MG tablet Take 500 mg by mouth 2 (two) times daily as needed for muscle spasms.     . rosuvastatin (CRESTOR) 5 MG tablet Take 5 mg by mouth daily.    . vitamin B-12 (CYANOCOBALAMIN) 1000 MCG tablet Take 1,000 mcg by mouth 2 (two) times daily.     . vitamin E (VITAMIN E) 400 UNIT capsule Take 400 Units by mouth daily.    [name] zinc gluconate 50 MG tablet Take 50 mg by mouth daily.     Current Facility-Administered Medications  Medication Dose Route Frequency  Provider Last Rate Last Admin  . 0.9 %  sodium chloride infusion  500 mL Intravenous Once Irene Shipper, MD        Allergies  Allergen Reactions  . Sulfa Antibiotics Other (See Comments)    Dry skin, cracking skin     Past Medical History:  Diagnosis Date  . Carpal tunnel syndrome   . Clotting disorder ([place])   . Diverticulosis   . DJD (degenerative joint disease)   . Gout   . Hyperlipemia   . Meningitis    at age 18 weeks old  . PE (pulmonary embolism) 2005   hx post freq travel-2005  . Umbilical hernia     Past Surgical History:  Procedure Laterality Date  . BACK SURGERY     lumb lam  . CARPAL TUNNEL RELEASE  08/27/2012   Procedure: CARPAL TUNNEL RELEASE;  Surgeon: Roseanne Kaufman, MD;  Location: [place];  Service: Orthopedics;  Laterality: Right;  Right Limited Open Carpal Tunnel Release   . COLONOSCOPY    . I & D EXTREMITY  09/07/2012   Procedure: IRRIGATION AND DEBRIDEMENT EXTREMITY;  Surgeon: Linna Hoff, MD;  Location: [place];  Service: Orthopedics;  Laterality: Right;  . INSERTION OF MESH N/A 05/12/2015   Procedure: INSERTION OF MESH;  Surgeon: Ralene Ok, MD;  [facility];  Service: General;  Laterality: N/A;  . [place]   lt knee reconstruction hign [id number]  . KNEE ARTHROSCOPY     x3  . STERIOD INJECTION  08/27/2012   Procedure: STEROID INJECTION;  Surgeon: Roseanne Kaufman, MD;  Location: [place];  Service: Orthopedics;  Laterality: Left;  . TOTAL HIP ARTHROPLASTY  2012   left  . TOTAL KNEE ARTHROPLASTY  2007   rt  . TOTAL KNEE REVISION  2011   rt  . UMBILICAL HERNIA REPAIR N/A 05/12/2015   Procedure: LAPAROSCOPIC UMBILICAL HERNIA REPAIR;  Surgeon: Ralene Ok, MD;  Location: [place];  Service: General;  Laterality: N/A;  . WRIST ARTHROSCOPY  2006   debrid-rt    Social History   Socioeconomic History  . Marital status: Married    Spouse name: Not on file  . Number of children: Not on  file  . Years of education: Not on file  . Highest education level: Not on file  Occupational History  . Not on file  Tobacco Use  . Smoking status: Former Smoker    Types: Pipe  . Smokeless tobacco: Never Used  . Tobacco comment: " quit a couple of years ago"( 05/11/15)  Substance and Sexual Activity  . Alcohol use: Yes    Comment: occasional  . Drug use: No  . Sexual activity: Yes    Birth control/protection: None    Comment: smokes pipe-occ cigar  Other Topics Concern  . Not on file  Social History Narrative  . Not on file   Social Determinants of Health   Financial Resource Strain:   . Difficulty of Paying Living Expenses:   Food Insecurity:   . Worried About Charity fundraiser in the Last Year:   . Arboriculturist in the Last Year:   Transportation Needs:   . Film/video editor (Medical):   [name] Lack of Transportation (Non-Medical):   Physical Activity:   . Days of Exercise per Week:   . Minutes of Exercise per Session:   Stress:   . Feeling of Stress :   Social Connections:   . Frequency of Communication with Friends and Family:   . Frequency of Social Gatherings with Friends and Family:   . Attends Religious Services:   . Active Member of Clubs or Organizations:   . Attends Archivist Meetings:   [name] Marital Status:   Intimate Partner Violence:   . Fear of Current or Ex-Partner:   . Emotionally Abused:   [name] Physically Abused:   . Sexually Abused:     Family History  Adopted: Yes    ROS: Back pain but no fevers or chills, productive cough, hemoptysis, dysphasia, odynophagia, melena, hematochezia, dysuria, hematuria, rash, seizure activity, orthopnea, PND, pedal edema, claudication. Remaining systems are negative.  Physical Exam:   Blood pressure 124/86, pulse 78, height 5' 9.5" (1.765 m).  General:  Well developed/well nourished in NAD Skin warm/dry Patient not depressed No peripheral clubbing Back-normal HEENT-normal/normal eyelids  Neck supple/normal carotid upstroke bilaterally; no bruits; no JVD; no thyromegaly chest - CTA/ normal expansion CV - RRR/normal S1 and S2; no murmurs, rubs or gallops;  PMI nondisplaced Abdomen -NT/ND, no HSM, no mass, + bowel sounds, no bruit 2+ femoral pulses, no bruits Ext-no edema, chords, 2+ DP Neuro-grossly nonfocal  ECG -November 09, 2019-sinus rhythm with no ST changes.  Personally reviewed  Today's electrocardiogram shows sinus rhythm with no ST changes.  Personally  reviewed.  A/P  1 chest pain-symptoms with both typical and atypical features.  Enzymes were negative.  Electrocardiogram shows no ST changes.  Also note D-dimer was normal.  We will plan to proceed with a cardiac CTA to exclude significant coronary disease.  2 history of pulmonary embolus-patient is on chronic apixaban.  Recent D-dimer was normal.  3 hyperlipidemia-continue statin.  Kirk Ruths, MD

## 2020-01-08 ENCOUNTER — Encounter: Payer: Self-pay | Admitting: Cardiology

## 2020-01-08 ENCOUNTER — Ambulatory Visit: Payer: 59 | Admitting: Cardiology

## 2020-01-08 ENCOUNTER — Other Ambulatory Visit: Payer: Self-pay

## 2020-01-08 VITALS — BP 124/86 | HR 78 | Ht 69.5 in

## 2020-01-08 DIAGNOSIS — R072 Precordial pain: Secondary | ICD-10-CM

## 2020-01-08 DIAGNOSIS — E7800 Pure hypercholesterolemia, unspecified: Secondary | ICD-10-CM | POA: Diagnosis not present

## 2020-01-08 DIAGNOSIS — Z86711 Personal history of pulmonary embolism: Secondary | ICD-10-CM

## 2020-01-08 MED ORDER — METOPROLOL TARTRATE 100 MG PO TABS: ORAL_TABLET | ORAL | 0 refills | Status: DC

## 2020-01-08 NOTE — Patient Instructions (Signed)
Medication Instructions:  NO CHANGE *If you need a refill on your cardiac medications before your next appointment, please call your pharmacy*   Lab Work: If you have labs (blood work) drawn today and your tests are completely normal, you will receive your results only by: [name] MyChart Message (if you have MyChart) OR . A paper copy in the mail If you have any lab test that is abnormal or we need to change your treatment, we will call you to review the results.   Testing/Procedures:  Your cardiac CT will be scheduled at one of the below locations:   [facility] [address] [place], [place] [address] [phone]  If scheduled at [facility], please arrive at the [facility] [address] main entrance of [facility] 30 minutes prior to test start time. Proceed to the [facility] Radiology Department (first floor) to check-in and test prep.  Please follow these instructions carefully (unless otherwise directed):  Hold all erectile dysfunction medications at least 3 days (72 hrs) prior to test.  On the Night Before the Test: . Be sure to Drink plenty of water. . Do not consume any caffeinated/decaffeinated beverages or chocolate 12 hours prior to your test. . Do not take any antihistamines 12 hours prior to your test. . If you take Metformin do not take 24 hours prior to test.  On the Day of the Test: . Drink plenty of water. Do not drink any water within one hour of the test. . Do not eat any food 4 hours prior to the test. . You may take your regular medications prior to the test.  . Take metoprolol (Lopressor) 100 MG two hours prior to test. . HOLD Furosemide/Hydrochlorothiazide morning of the test. . FEMALES- please wear underwire-free bra if available      After the Test: . Drink plenty of water. . After receiving IV contrast, you may experience a mild flushed feeling. This is normal. . On occasion, you may experience a mild rash up to 24 hours after the  test. This is not dangerous. If this occurs, you can take Benadryl 25 mg and increase your fluid intake. . If you experience trouble breathing, this can be serious. If it is severe call 911 IMMEDIATELY. If it is mild, please call our office. . If you take any of these medications: Glipizide/Metformin, Avandament, Glucavance, please do not take 48 hours after completing test unless otherwise instructed.   Once we have confirmed authorization from your insurance company, we will call you to set up a date and time for your test.   For non-scheduling related questions, please contact the cardiac imaging nurse navigator should you have any questions/concerns: Marchia Bond, RN Navigator Cardiac Imaging [facility] [phone] office  For scheduling needs, including cancellations and rescheduling, please call [phone].    Follow-Up: At [facility], you and your health needs are our priority.  As part of our continuing mission to provide you with exceptional heart care, we have created designated Provider Care Teams.  These Care Teams include your primary Cardiologist (physician) and Advanced Practice Providers (APPs -  Physician Assistants and Nurse Practitioners) who all work together to provide you with the care you need, when you need it.  We recommend signing up for the patient portal called "MyChart".  Sign up information is provided on this After Visit Summary.  MyChart is used to connect with patients for Virtual Visits (Telemedicine).  Patients are able to view  lab/test results, encounter notes, upcoming appointments, etc.  Non-urgent messages can be sent to your provider as well.   To learn more about what you can do with MyChart, go to [email or web address].    Your next appointment:   AS NEEDED

## 2020-01-16 ENCOUNTER — Other Ambulatory Visit: Payer: Self-pay | Admitting: *Deleted

## 2020-01-16 DIAGNOSIS — R072 Precordial pain: Secondary | ICD-10-CM

## 2020-01-19 ENCOUNTER — Telehealth (HOSPITAL_COMMUNITY): Payer: Self-pay | Admitting: *Deleted

## 2020-01-19 DIAGNOSIS — R072 Precordial pain: Secondary | ICD-10-CM | POA: Diagnosis not present

## 2020-01-19 NOTE — Telephone Encounter (Signed)
Attempted to call patient regarding upcoming cardiac CT appointment. Left message on voicemail with name and callback number Naria Abbey Tai RN Navigator Cardiac Imaging [facility] [phone] Office [phone] Cell 

## 2020-01-20 ENCOUNTER — Encounter (HOSPITAL_COMMUNITY): Payer: Self-pay

## 2020-01-20 ENCOUNTER — Other Ambulatory Visit: Payer: Self-pay

## 2020-01-20 ENCOUNTER — Encounter: Payer: Self-pay | Admitting: *Deleted

## 2020-01-20 ENCOUNTER — Ambulatory Visit (HOSPITAL_COMMUNITY)
Admission: RE | Admit: 2020-01-20 | Discharge: 2020-01-20 | Disposition: A | Discharge status: Home / Self Care | Payer: 59 | Source: Ambulatory Visit | Attending: Cardiology | Admitting: Cardiology

## 2020-01-20 DIAGNOSIS — R072 Precordial pain: Secondary | ICD-10-CM | POA: Diagnosis not present

## 2020-01-20 LAB — BASIC METABOLIC PANEL
BUN/Creatinine Ratio: 18 (ref 10–24)
BUN: 18 mg/dL (ref 8–27)
CO2: 20 mmol/L (ref 20–29)
Calcium: 9.5 mg/dL (ref 8.6–10.2)
Chloride: 108 mmol/L — ABNORMAL HIGH (ref 96–106)
Creatinine, Ser: 1 mg/dL (ref 0.76–1.27)
GFR calc Af Amer: 93 mL/min/{1.73_m2} (ref >59)
GFR calc non Af Amer: 80 mL/min/{1.73_m2} (ref >59)
Glucose: 122 mg/dL — ABNORMAL HIGH (ref 65–99)
Potassium: 4.7 mmol/L (ref 3.5–5.2)
Sodium: 142 mmol/L (ref 134–144)

## 2020-01-20 MED ORDER — NITROGLYCERIN 0.4 MG SL SUBL
SUBLINGUAL_TABLET | SUBLINGUAL | Status: AC
  Filled 2020-01-20: qty 2

## 2020-01-20 MED ORDER — IOHEXOL 350 MG/ML SOLN
100.0000 mL | Freq: Once | INTRAVENOUS | Status: AC | PRN
  Administered 2020-01-20: 100 mL via INTRAVENOUS

## 2020-01-20 MED ORDER — NITROGLYCERIN 0.4 MG SL SUBL
0.8000 mg | SUBLINGUAL_TABLET | Freq: Once | SUBLINGUAL | Status: AC
  Administered 2020-01-20: 0.8 mg via SUBLINGUAL

## 2020-01-20 NOTE — Progress Notes (Signed)
CT scan completed. Tolerated well. D/C home ambulatory, awake and alert. In no distress. 

## 2020-01-21 ENCOUNTER — Telehealth: Payer: Self-pay | Admitting: *Deleted

## 2020-01-21 DIAGNOSIS — I2510 Atherosclerotic heart disease of native coronary artery without angina pectoris: Secondary | ICD-10-CM

## 2020-01-21 MED ORDER — ROSUVASTATIN CALCIUM 40 MG PO TABS: 40.0000 mg | ORAL_TABLET | Freq: Every day | ORAL | 3 refills | Status: DC

## 2020-01-21 NOTE — Telephone Encounter (Signed)
Follow Up ° °Patient is returning call. Please give patient a call back.  °

## 2020-01-21 NOTE — Telephone Encounter (Addendum)
-----   Message from Lelon Perla, MD sent at 01/21/2020 11:50 AM EDT ----- Given coronary artery disease would increase Crestor to 40 mg daily.  Check lipids and liver in 12 weeks. Kirk Ruths  Left message for pt to call

## 2020-01-21 NOTE — Telephone Encounter (Signed)
Spoke with Jason Norton, Aware of dr crenshaw's recommendations. New script sent to the pharmacy and Lab orders mailed to the Jason Norton  

## 2020-04-30 ENCOUNTER — Other Ambulatory Visit (HOSPITAL_COMMUNITY): Payer: Self-pay | Admitting: Internal Medicine

## 2020-05-19 DIAGNOSIS — M109 Gout, unspecified: Secondary | ICD-10-CM | POA: Diagnosis not present

## 2020-05-19 DIAGNOSIS — E7800 Pure hypercholesterolemia, unspecified: Secondary | ICD-10-CM | POA: Diagnosis not present

## 2020-05-19 DIAGNOSIS — R7301 Impaired fasting glucose: Secondary | ICD-10-CM | POA: Diagnosis not present

## 2020-05-19 DIAGNOSIS — Z125 Encounter for screening for malignant neoplasm of prostate: Secondary | ICD-10-CM | POA: Diagnosis not present

## 2020-05-19 DIAGNOSIS — Z0000 Encounter for general adult medical examination without abnormal findings: Secondary | ICD-10-CM | POA: Diagnosis not present

## 2020-05-25 DIAGNOSIS — E7800 Pure hypercholesterolemia, unspecified: Secondary | ICD-10-CM | POA: Diagnosis not present

## 2020-05-25 DIAGNOSIS — D6859 Other primary thrombophilia: Secondary | ICD-10-CM | POA: Diagnosis not present

## 2020-05-25 DIAGNOSIS — N401 Enlarged prostate with lower urinary tract symptoms: Secondary | ICD-10-CM | POA: Diagnosis not present

## 2020-05-25 DIAGNOSIS — Z0000 Encounter for general adult medical examination without abnormal findings: Secondary | ICD-10-CM | POA: Diagnosis not present

## 2020-05-25 DIAGNOSIS — D126 Benign neoplasm of colon, unspecified: Secondary | ICD-10-CM | POA: Diagnosis not present

## 2020-05-25 DIAGNOSIS — K760 Fatty (change of) liver, not elsewhere classified: Secondary | ICD-10-CM | POA: Diagnosis not present

## 2020-05-25 DIAGNOSIS — L649 Androgenic alopecia, unspecified: Secondary | ICD-10-CM | POA: Diagnosis not present

## 2020-05-25 DIAGNOSIS — Z86711 Personal history of pulmonary embolism: Secondary | ICD-10-CM | POA: Diagnosis not present

## 2020-05-25 DIAGNOSIS — R635 Abnormal weight gain: Secondary | ICD-10-CM | POA: Diagnosis not present

## 2020-05-25 DIAGNOSIS — R05 Cough: Secondary | ICD-10-CM | POA: Diagnosis not present

## 2020-05-25 DIAGNOSIS — R7301 Impaired fasting glucose: Secondary | ICD-10-CM | POA: Diagnosis not present

## 2020-05-25 DIAGNOSIS — Z23 Encounter for immunization: Secondary | ICD-10-CM | POA: Diagnosis not present

## 2020-06-14 ENCOUNTER — Other Ambulatory Visit (HOSPITAL_COMMUNITY): Payer: Self-pay | Admitting: Neurosurgery

## 2020-06-16 DIAGNOSIS — M5416 Radiculopathy, lumbar region: Secondary | ICD-10-CM | POA: Diagnosis not present

## 2020-06-16 DIAGNOSIS — M5451 Vertebrogenic low back pain: Secondary | ICD-10-CM | POA: Diagnosis not present

## 2020-06-16 DIAGNOSIS — M5126 Other intervertebral disc displacement, lumbar region: Secondary | ICD-10-CM | POA: Diagnosis not present

## 2020-06-16 DIAGNOSIS — M4120 Other idiopathic scoliosis, site unspecified: Secondary | ICD-10-CM | POA: Diagnosis not present

## 2020-06-18 ENCOUNTER — Other Ambulatory Visit (HOSPITAL_COMMUNITY): Payer: Self-pay | Admitting: Neurosurgery

## 2020-06-28 ENCOUNTER — Other Ambulatory Visit: Payer: Self-pay | Admitting: Neurosurgery

## 2020-06-28 ENCOUNTER — Other Ambulatory Visit (HOSPITAL_COMMUNITY): Payer: Self-pay | Admitting: Neurosurgery

## 2020-06-28 DIAGNOSIS — M5416 Radiculopathy, lumbar region: Secondary | ICD-10-CM

## 2020-07-07 ENCOUNTER — Ambulatory Visit (HOSPITAL_COMMUNITY)
Admission: RE | Admit: 2020-07-07 | Discharge: 2020-07-07 | Disposition: A | Discharge status: Home / Self Care | Payer: 59 | Source: Ambulatory Visit | Attending: Neurosurgery | Admitting: Neurosurgery

## 2020-07-07 DIAGNOSIS — M5416 Radiculopathy, lumbar region: Secondary | ICD-10-CM | POA: Insufficient documentation

## 2020-07-07 DIAGNOSIS — M545 Low back pain, unspecified: Secondary | ICD-10-CM | POA: Diagnosis not present

## 2020-07-07 MED ORDER — GADOBUTROL 1 MMOL/ML IV SOLN
9.0000 mL | Freq: Once | INTRAVENOUS | Status: AC | PRN
  Administered 2020-07-07: 9 mL via INTRAVENOUS

## 2020-07-09 ENCOUNTER — Ambulatory Visit (HOSPITAL_COMMUNITY): Payer: 59

## 2020-07-19 ENCOUNTER — Other Ambulatory Visit (HOSPITAL_COMMUNITY): Payer: Self-pay | Admitting: Internal Medicine

## 2020-07-22 ENCOUNTER — Other Ambulatory Visit (HOSPITAL_COMMUNITY): Payer: Self-pay | Admitting: Internal Medicine

## 2020-08-13 ENCOUNTER — Ambulatory Visit: Payer: 59

## 2020-08-17 ENCOUNTER — Ambulatory Visit: Payer: 59

## 2020-08-19 ENCOUNTER — Other Ambulatory Visit (HOSPITAL_COMMUNITY): Payer: Self-pay | Admitting: Internal Medicine

## 2020-09-02 ENCOUNTER — Other Ambulatory Visit (HOSPITAL_COMMUNITY): Payer: Self-pay | Admitting: Internal Medicine

## 2020-10-06 ENCOUNTER — Other Ambulatory Visit (HOSPITAL_COMMUNITY): Payer: Self-pay | Admitting: Internal Medicine

## 2020-11-11 ENCOUNTER — Other Ambulatory Visit (HOSPITAL_COMMUNITY): Payer: Self-pay | Admitting: Internal Medicine

## 2020-12-08 ENCOUNTER — Other Ambulatory Visit (HOSPITAL_COMMUNITY): Payer: Self-pay | Admitting: Internal Medicine

## 2020-12-13 ENCOUNTER — Other Ambulatory Visit (HOSPITAL_COMMUNITY): Payer: Self-pay

## 2020-12-22 ENCOUNTER — Other Ambulatory Visit (HOSPITAL_COMMUNITY): Payer: Self-pay

## 2020-12-22 DIAGNOSIS — E7800 Pure hypercholesterolemia, unspecified: Secondary | ICD-10-CM | POA: Diagnosis not present

## 2020-12-22 DIAGNOSIS — R7301 Impaired fasting glucose: Secondary | ICD-10-CM | POA: Diagnosis not present

## 2020-12-22 DIAGNOSIS — M1990 Unspecified osteoarthritis, unspecified site: Secondary | ICD-10-CM | POA: Diagnosis not present

## 2020-12-22 DIAGNOSIS — L649 Androgenic alopecia, unspecified: Secondary | ICD-10-CM | POA: Diagnosis not present

## 2020-12-22 DIAGNOSIS — K760 Fatty (change of) liver, not elsewhere classified: Secondary | ICD-10-CM | POA: Diagnosis not present

## 2020-12-22 DIAGNOSIS — D6859 Other primary thrombophilia: Secondary | ICD-10-CM | POA: Diagnosis not present

## 2020-12-22 DIAGNOSIS — M109 Gout, unspecified: Secondary | ICD-10-CM | POA: Diagnosis not present

## 2020-12-22 DIAGNOSIS — Z86711 Personal history of pulmonary embolism: Secondary | ICD-10-CM | POA: Diagnosis not present

## 2020-12-22 DIAGNOSIS — N401 Enlarged prostate with lower urinary tract symptoms: Secondary | ICD-10-CM | POA: Diagnosis not present

## 2020-12-22 MED FILL — Finasteride Tab 5 MG: ORAL | 30 days supply | Qty: 30 | Fill #0 | Status: AC

## 2020-12-22 MED FILL — Doxycycline Hyclate Tab 100 MG: ORAL | 30 days supply | Qty: 60 | Fill #0 | Status: AC

## 2020-12-22 MED FILL — Levothyroxine Sodium Tab 25 MCG: ORAL | 90 days supply | Qty: 90 | Fill #0 | Status: AC

## 2020-12-23 ENCOUNTER — Other Ambulatory Visit (HOSPITAL_COMMUNITY): Payer: Self-pay

## 2020-12-29 ENCOUNTER — Other Ambulatory Visit (HOSPITAL_COMMUNITY): Payer: Self-pay

## 2020-12-29 MED FILL — Methocarbamol Tab 500 MG: ORAL | 10 days supply | Qty: 60 | Fill #0 | Status: AC

## 2021-01-25 ENCOUNTER — Other Ambulatory Visit (HOSPITAL_COMMUNITY): Payer: Self-pay

## 2021-01-25 MED ORDER — LEVOTHYROXINE SODIUM 75 MCG PO TABS
75.0000 ug | ORAL_TABLET | Freq: Every day | ORAL | 1 refills | Status: DC
  Filled 2021-01-25 – 2021-01-27 (×2): qty 30, 30d supply, fill #0

## 2021-01-26 ENCOUNTER — Other Ambulatory Visit (HOSPITAL_COMMUNITY): Payer: Self-pay

## 2021-01-26 MED FILL — Doxycycline Hyclate Tab 100 MG: ORAL | 30 days supply | Qty: 60 | Fill #1 | Status: AC

## 2021-01-26 MED FILL — Finasteride Tab 5 MG: ORAL | 30 days supply | Qty: 30 | Fill #1 | Status: AC

## 2021-01-27 ENCOUNTER — Other Ambulatory Visit (HOSPITAL_COMMUNITY): Payer: Self-pay

## 2021-01-27 MED FILL — Allopurinol Tab 300 MG: ORAL | 90 days supply | Qty: 90 | Fill #0 | Status: AC

## 2021-02-04 DIAGNOSIS — E039 Hypothyroidism, unspecified: Secondary | ICD-10-CM | POA: Diagnosis not present

## 2021-02-08 ENCOUNTER — Other Ambulatory Visit (HOSPITAL_COMMUNITY): Payer: Self-pay

## 2021-02-08 MED ORDER — SYNTHROID 100 MCG PO TABS
100.0000 ug | ORAL_TABLET | Freq: Every morning | ORAL | 3 refills | Status: DC
  Filled 2021-02-08: qty 30, 30d supply, fill #0
  Filled 2021-03-17: qty 30, 30d supply, fill #1
  Filled 2021-04-29: qty 30, 30d supply, fill #2
  Filled 2021-05-27: qty 30, 30d supply, fill #3

## 2021-02-24 ENCOUNTER — Other Ambulatory Visit (HOSPITAL_COMMUNITY): Payer: Self-pay

## 2021-02-24 MED FILL — Celecoxib Cap 200 MG: ORAL | 90 days supply | Qty: 90 | Fill #0 | Status: AC

## 2021-02-24 MED FILL — Rosuvastatin Calcium Tab 5 MG: ORAL | 90 days supply | Qty: 90 | Fill #0 | Status: AC

## 2021-02-24 MED FILL — Finasteride Tab 5 MG: ORAL | 30 days supply | Qty: 30 | Fill #2 | Status: AC

## 2021-03-08 ENCOUNTER — Other Ambulatory Visit (HOSPITAL_COMMUNITY): Payer: Self-pay

## 2021-03-08 MED FILL — Doxycycline Hyclate Tab 100 MG: ORAL | 30 days supply | Qty: 60 | Fill #2 | Status: AC

## 2021-03-17 ENCOUNTER — Other Ambulatory Visit (HOSPITAL_COMMUNITY): Payer: Self-pay

## 2021-03-31 DIAGNOSIS — E039 Hypothyroidism, unspecified: Secondary | ICD-10-CM | POA: Diagnosis not present

## 2021-04-07 ENCOUNTER — Other Ambulatory Visit (HOSPITAL_COMMUNITY): Payer: Self-pay

## 2021-04-07 MED FILL — Finasteride Tab 5 MG: ORAL | 30 days supply | Qty: 30 | Fill #3 | Status: AC

## 2021-04-07 MED FILL — Apixaban Tab 5 MG: ORAL | 90 days supply | Qty: 180 | Fill #0 | Status: AC

## 2021-04-08 ENCOUNTER — Other Ambulatory Visit (HOSPITAL_COMMUNITY): Payer: Self-pay

## 2021-04-11 ENCOUNTER — Other Ambulatory Visit (HOSPITAL_COMMUNITY): Payer: Self-pay

## 2021-04-15 ENCOUNTER — Other Ambulatory Visit (HOSPITAL_COMMUNITY): Payer: Self-pay

## 2021-04-15 MED ORDER — CARESTART COVID-19 HOME TEST VI KIT
PACK | 0 refills | Status: DC
  Filled 2021-04-15: qty 4, 4d supply, fill #0

## 2021-04-18 ENCOUNTER — Other Ambulatory Visit (HOSPITAL_COMMUNITY): Payer: Self-pay

## 2021-04-19 ENCOUNTER — Other Ambulatory Visit (HOSPITAL_COMMUNITY): Payer: Self-pay

## 2021-04-20 ENCOUNTER — Other Ambulatory Visit (HOSPITAL_COMMUNITY): Payer: Self-pay

## 2021-04-20 MED ORDER — DOXYCYCLINE HYCLATE 100 MG PO TABS
100.0000 mg | ORAL_TABLET | Freq: Two times a day (BID) | ORAL | 2 refills | Status: DC
  Filled 2021-04-20: qty 60, 30d supply, fill #0
  Filled 2021-06-01: qty 60, 30d supply, fill #1
  Filled 2021-07-13: qty 60, 30d supply, fill #2

## 2021-04-21 ENCOUNTER — Other Ambulatory Visit (HOSPITAL_COMMUNITY): Payer: Self-pay

## 2021-04-29 ENCOUNTER — Other Ambulatory Visit (HOSPITAL_COMMUNITY): Payer: Self-pay

## 2021-04-29 MED FILL — Allopurinol Tab 300 MG: ORAL | 90 days supply | Qty: 90 | Fill #1 | Status: AC

## 2021-05-27 ENCOUNTER — Other Ambulatory Visit (HOSPITAL_COMMUNITY): Payer: Self-pay

## 2021-05-27 MED FILL — Finasteride Tab 5 MG: ORAL | 30 days supply | Qty: 30 | Fill #4 | Status: AC

## 2021-05-30 DIAGNOSIS — Z125 Encounter for screening for malignant neoplasm of prostate: Secondary | ICD-10-CM | POA: Diagnosis not present

## 2021-05-30 DIAGNOSIS — E7800 Pure hypercholesterolemia, unspecified: Secondary | ICD-10-CM | POA: Diagnosis not present

## 2021-05-30 DIAGNOSIS — E039 Hypothyroidism, unspecified: Secondary | ICD-10-CM | POA: Diagnosis not present

## 2021-05-30 DIAGNOSIS — M109 Gout, unspecified: Secondary | ICD-10-CM | POA: Diagnosis not present

## 2021-05-31 ENCOUNTER — Other Ambulatory Visit (HOSPITAL_COMMUNITY): Payer: Self-pay

## 2021-05-31 MED FILL — Methocarbamol Tab 500 MG: ORAL | 10 days supply | Qty: 60 | Fill #1 | Status: AC

## 2021-06-01 ENCOUNTER — Other Ambulatory Visit (HOSPITAL_COMMUNITY): Payer: Self-pay

## 2021-06-01 MED FILL — Rosuvastatin Calcium Tab 5 MG: ORAL | 90 days supply | Qty: 90 | Fill #1 | Status: AC

## 2021-06-02 ENCOUNTER — Other Ambulatory Visit (HOSPITAL_COMMUNITY): Payer: Self-pay

## 2021-06-06 ENCOUNTER — Other Ambulatory Visit (HOSPITAL_COMMUNITY): Payer: Self-pay

## 2021-06-06 DIAGNOSIS — L719 Rosacea, unspecified: Secondary | ICD-10-CM | POA: Diagnosis not present

## 2021-06-06 DIAGNOSIS — Z0000 Encounter for general adult medical examination without abnormal findings: Secondary | ICD-10-CM | POA: Diagnosis not present

## 2021-06-06 DIAGNOSIS — N401 Enlarged prostate with lower urinary tract symptoms: Secondary | ICD-10-CM | POA: Diagnosis not present

## 2021-06-06 DIAGNOSIS — D6859 Other primary thrombophilia: Secondary | ICD-10-CM | POA: Diagnosis not present

## 2021-06-06 DIAGNOSIS — R82998 Other abnormal findings in urine: Secondary | ICD-10-CM | POA: Diagnosis not present

## 2021-06-06 DIAGNOSIS — E7800 Pure hypercholesterolemia, unspecified: Secondary | ICD-10-CM | POA: Diagnosis not present

## 2021-06-06 DIAGNOSIS — K760 Fatty (change of) liver, not elsewhere classified: Secondary | ICD-10-CM | POA: Diagnosis not present

## 2021-06-06 DIAGNOSIS — L649 Androgenic alopecia, unspecified: Secondary | ICD-10-CM | POA: Diagnosis not present

## 2021-06-06 DIAGNOSIS — Z1331 Encounter for screening for depression: Secondary | ICD-10-CM | POA: Diagnosis not present

## 2021-06-06 DIAGNOSIS — Z86711 Personal history of pulmonary embolism: Secondary | ICD-10-CM | POA: Diagnosis not present

## 2021-06-06 DIAGNOSIS — Z1212 Encounter for screening for malignant neoplasm of rectum: Secondary | ICD-10-CM | POA: Diagnosis not present

## 2021-06-06 DIAGNOSIS — R7301 Impaired fasting glucose: Secondary | ICD-10-CM | POA: Diagnosis not present

## 2021-06-06 DIAGNOSIS — Z23 Encounter for immunization: Secondary | ICD-10-CM | POA: Diagnosis not present

## 2021-06-06 DIAGNOSIS — Z1339 Encounter for screening examination for other mental health and behavioral disorders: Secondary | ICD-10-CM | POA: Diagnosis not present

## 2021-06-06 MED ORDER — CELECOXIB 200 MG PO CAPS
200.0000 mg | ORAL_CAPSULE | Freq: Every day | ORAL | 0 refills | Status: DC | PRN
  Filled 2021-06-06: qty 90, 90d supply, fill #0

## 2021-06-07 ENCOUNTER — Other Ambulatory Visit (HOSPITAL_COMMUNITY): Payer: Self-pay

## 2021-07-06 ENCOUNTER — Other Ambulatory Visit (HOSPITAL_COMMUNITY): Payer: Self-pay

## 2021-07-06 MED ORDER — SYNTHROID 100 MCG PO TABS
100.0000 ug | ORAL_TABLET | Freq: Every morning | ORAL | 12 refills | Status: DC
  Filled 2021-07-06: qty 30, 30d supply, fill #0
  Filled 2021-08-09: qty 30, 30d supply, fill #1
  Filled 2021-08-29: qty 90, 90d supply, fill #2
  Filled 2021-12-15: qty 90, 90d supply, fill #3
  Filled 2022-03-29: qty 90, 90d supply, fill #4
  Filled 2022-06-21: qty 60, 60d supply, fill #5

## 2021-07-06 MED FILL — Finasteride Tab 5 MG: ORAL | 30 days supply | Qty: 30 | Fill #5 | Status: AC

## 2021-07-08 ENCOUNTER — Other Ambulatory Visit (HOSPITAL_COMMUNITY): Payer: Self-pay

## 2021-07-13 ENCOUNTER — Other Ambulatory Visit (HOSPITAL_COMMUNITY): Payer: Self-pay

## 2021-07-14 DIAGNOSIS — H5212 Myopia, left eye: Secondary | ICD-10-CM | POA: Diagnosis not present

## 2021-07-29 ENCOUNTER — Other Ambulatory Visit (HOSPITAL_COMMUNITY): Payer: Self-pay

## 2021-07-29 MED ORDER — ELIQUIS 5 MG PO TABS
5.0000 mg | ORAL_TABLET | Freq: Two times a day (BID) | ORAL | 3 refills | Status: DC
  Filled 2021-07-29: qty 180, 90d supply, fill #0
  Filled 2021-11-08: qty 180, 90d supply, fill #1
  Filled 2022-03-04: qty 180, 90d supply, fill #2
  Filled 2022-05-31: qty 180, 90d supply, fill #3

## 2021-08-09 ENCOUNTER — Other Ambulatory Visit (HOSPITAL_COMMUNITY): Payer: Self-pay

## 2021-08-10 ENCOUNTER — Other Ambulatory Visit (HOSPITAL_COMMUNITY): Payer: Self-pay

## 2021-08-15 ENCOUNTER — Other Ambulatory Visit (HOSPITAL_COMMUNITY): Payer: Self-pay

## 2021-08-15 MED ORDER — ALLOPURINOL 300 MG PO TABS
300.0000 mg | ORAL_TABLET | Freq: Every day | ORAL | 4 refills | Status: DC
  Filled 2021-08-15: qty 90, 90d supply, fill #0
  Filled 2021-11-21: qty 90, 90d supply, fill #1
  Filled 2022-03-04: qty 90, 90d supply, fill #2
  Filled 2022-05-31: qty 90, 90d supply, fill #3

## 2021-08-15 MED ORDER — FINASTERIDE 5 MG PO TABS
5.0000 mg | ORAL_TABLET | Freq: Every day | ORAL | 13 refills | Status: DC
  Filled 2021-08-15: qty 30, 30d supply, fill #0
  Filled 2021-09-14: qty 30, 30d supply, fill #1
  Filled 2021-11-02: qty 30, 30d supply, fill #2
  Filled 2021-12-14: qty 30, 30d supply, fill #3
  Filled 2022-01-18: qty 30, 30d supply, fill #4
  Filled 2022-03-29: qty 30, 30d supply, fill #5
  Filled 2022-05-01: qty 30, 30d supply, fill #6
  Filled 2022-05-31: qty 30, 30d supply, fill #7
  Filled 2022-07-13: qty 30, 30d supply, fill #8
  Filled 2022-08-12: qty 30, 30d supply, fill #9

## 2021-08-16 ENCOUNTER — Other Ambulatory Visit (HOSPITAL_COMMUNITY): Payer: Self-pay

## 2021-08-16 MED ORDER — DOXYCYCLINE HYCLATE 100 MG PO CAPS
100.0000 mg | ORAL_CAPSULE | Freq: Two times a day (BID) | ORAL | 1 refills | Status: DC
  Filled 2021-08-16: qty 180, 90d supply, fill #0
  Filled 2021-12-05: qty 180, 90d supply, fill #1

## 2021-08-29 ENCOUNTER — Other Ambulatory Visit (HOSPITAL_COMMUNITY): Payer: Self-pay

## 2021-08-29 MED ORDER — CELECOXIB 200 MG PO CAPS
200.0000 mg | ORAL_CAPSULE | Freq: Every day | ORAL | 0 refills | Status: DC | PRN
  Filled 2021-08-29: qty 90, 90d supply, fill #0

## 2021-09-14 ENCOUNTER — Other Ambulatory Visit (HOSPITAL_COMMUNITY): Payer: Self-pay

## 2021-09-14 MED FILL — Rosuvastatin Calcium Tab 5 MG: ORAL | 90 days supply | Qty: 90 | Fill #2 | Status: AC

## 2021-11-02 ENCOUNTER — Other Ambulatory Visit (HOSPITAL_COMMUNITY): Payer: Self-pay

## 2021-11-03 ENCOUNTER — Other Ambulatory Visit (HOSPITAL_COMMUNITY): Payer: Self-pay

## 2021-11-08 ENCOUNTER — Other Ambulatory Visit (HOSPITAL_COMMUNITY): Payer: Self-pay

## 2021-11-21 ENCOUNTER — Other Ambulatory Visit (HOSPITAL_COMMUNITY): Payer: Self-pay

## 2021-11-21 DIAGNOSIS — E039 Hypothyroidism, unspecified: Secondary | ICD-10-CM | POA: Diagnosis not present

## 2021-11-21 DIAGNOSIS — E6609 Other obesity due to excess calories: Secondary | ICD-10-CM | POA: Diagnosis not present

## 2021-11-21 DIAGNOSIS — D6859 Other primary thrombophilia: Secondary | ICD-10-CM | POA: Diagnosis not present

## 2021-11-21 DIAGNOSIS — R7301 Impaired fasting glucose: Secondary | ICD-10-CM | POA: Diagnosis not present

## 2021-11-21 DIAGNOSIS — M109 Gout, unspecified: Secondary | ICD-10-CM | POA: Diagnosis not present

## 2021-11-21 DIAGNOSIS — M19011 Primary osteoarthritis, right shoulder: Secondary | ICD-10-CM | POA: Diagnosis not present

## 2021-11-21 DIAGNOSIS — K760 Fatty (change of) liver, not elsewhere classified: Secondary | ICD-10-CM | POA: Diagnosis not present

## 2021-11-21 DIAGNOSIS — N401 Enlarged prostate with lower urinary tract symptoms: Secondary | ICD-10-CM | POA: Diagnosis not present

## 2021-11-21 DIAGNOSIS — Z1331 Encounter for screening for depression: Secondary | ICD-10-CM | POA: Diagnosis not present

## 2021-11-21 DIAGNOSIS — E7800 Pure hypercholesterolemia, unspecified: Secondary | ICD-10-CM | POA: Diagnosis not present

## 2021-11-21 DIAGNOSIS — Z1339 Encounter for screening examination for other mental health and behavioral disorders: Secondary | ICD-10-CM | POA: Diagnosis not present

## 2021-11-21 MED ORDER — CELECOXIB 200 MG PO CAPS
200.0000 mg | ORAL_CAPSULE | Freq: Every day | ORAL | 0 refills | Status: DC | PRN
  Filled 2021-11-21: qty 90, 90d supply, fill #0

## 2021-11-21 MED ORDER — SEMAGLUTIDE-WEIGHT MANAGEMENT 0.25 MG/0.5ML ~~LOC~~ SOAJ
0.2500 mg | SUBCUTANEOUS | 0 refills | Status: DC
  Filled 2021-11-21 – 2021-11-23 (×2): qty 2, 28d supply, fill #0

## 2021-11-23 ENCOUNTER — Other Ambulatory Visit (HOSPITAL_COMMUNITY): Payer: Self-pay

## 2021-11-30 ENCOUNTER — Other Ambulatory Visit: Payer: Self-pay

## 2021-11-30 ENCOUNTER — Ambulatory Visit: Payer: 59 | Admitting: Orthopedic Surgery

## 2021-11-30 ENCOUNTER — Ambulatory Visit (INDEPENDENT_AMBULATORY_CARE_PROVIDER_SITE_OTHER): Payer: 59

## 2021-11-30 DIAGNOSIS — M25511 Pain in right shoulder: Secondary | ICD-10-CM | POA: Diagnosis not present

## 2021-11-30 DIAGNOSIS — M19011 Primary osteoarthritis, right shoulder: Secondary | ICD-10-CM

## 2021-12-03 ENCOUNTER — Encounter: Payer: Self-pay | Admitting: Orthopedic Surgery

## 2021-12-03 NOTE — Progress Notes (Signed)
? ?Office Visit Note ?  ?Patient: Jason Norton           ?Date of Birth: 05/20/1956           ?MRN: [id number] ?Visit Date: 11/30/2021 ?Requested by: Haywood Pao, MD ?[address] ?[place],  [place] [address] ?PCP: Haywood Pao, MD ? ?Subjective: ?Chief Complaint  ?Patient presents with  ? Right Shoulder - Pain  ? ? ?HPI: Jason Norton is a 66 year old patient with long history of right shoulder pain.  MRI from 2013 is reviewed which does show partial-thickness tearing of the rotator cuff.  Denies any history of injury to the right shoulder and he is right-hand dominant.  Play tennis for many years.  The pain wakes him from sleep at night and he has to use pillows to get himself position.  Reports radiating pain down to the elbow.  No numbness and tingling in the arm.  He also reports biceps pain.  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  Patient rates the pain as severe.  Reports both pain as well as decreased range of motion.  Patient takes Celebrex for pain.  Patient has a history of pulmonary embolism and takes chronic anticoagulation. ?             ?ROS: All systems reviewed are negative as they relate to the chief complaint within the history of present illness.  Patient denies  fevers or chills. ? ? ?Assessment & Plan: ?Visit Diagnoses:  ?1. Right shoulder pain, unspecified chronicity   ?2. Arthritis of right shoulder region   ? ? ?Plan: Impression is severe end-stage right shoulder arthritis.  I think based on the superior migration of the humeral head he may have a component of rotator cuff arthropathy as well.  Plan is thin cut CT scan preop reverse shoulder replacement.  The risk and benefits of the procedure are discussed today with the use of models.  They include but not limited to infection nerve vessel damage instability as well as incomplete restoration of function and incomplete pain relief.  We will need to have preoperative plan for his anticoagulation.  Patient  understands risk and benefits and wishes to proceed with surgical intervention. ? 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  No current fevers chills or systemic issues indicating ongoing infection or inflammatory process around his knee or hip replacements. ?Follow-Up Instructions: No follow-ups on file.  ? ?Orders:  ?Orders Placed This Encounter  ?Procedures  ? XR Shoulder Right  ? CT SHOULDER RIGHT WO CONTRAST  ? ?No orders of the defined types were placed in this encounter. ? ? ? ? Procedures: ?No procedures performed ? ? ?Clinical Data: ?No additional findings. ? ?Objective: ?Vital Signs: There were no vitals taken for this visit. ? ?Physical Exam:  ? ?Constitutional: Patient appears well-developed ?HEENT:  ?Head: Normocephalic ?Eyes:EOM are normal ?Neck: Normal range of motion ?Cardiovascular: Normal rate ?Pulmonary/chest: Effort normal ?Neurologic: Patient is alert ?Skin: Skin is warm ?Psychiatric: Patient has normal mood and affect ? ? ?Ortho Exam: Ortho exam demonstrates passive range of motion on the right 25/60/70.  Passive range of motion on the left is 30/80/90.  Rotator cuff strength is pretty reasonable to infraspinatus supraspinatus subscap muscle testing.  Expected amount of coarse grinding is present with passive range of motion on the right-hand side.  No AC joint tenderness is present.  Motor or sensory function to the hand is  intact with no Popeye deformity present in the right arm.  Radial pulse intact.  Neck range of motion intact as well.  Both knees and hips have good range of motion with no pain and normal gait. ? ?Specialty Comments:  ?No specialty comments available. ? ?Imaging: ?No results found. ? ? ?PMFS History: ?Patient Active Problem List  ? Diagnosis Date Noted  ? Umbilical hernia 70/35/0093  ? Hematoma, postoperative 09/07/2012  ? Chronic anticoagulation 09/07/2012  ? GOUT 10/25/2007  ? History of pulmonary embolism  10/25/2007  ? DIARRHEA 10/25/2007  ? PERSONAL HISTORY OF ARTHRITIS 10/25/2007  ? ?Past Medical History:  ?Diagnosis Date  ? Carpal tunnel syndrome   ? Clotting disorder ([place])   ? Diverticulosis   ? DJD (degenerative joint disease)   ? Gout   ? Hyperlipemia   ? Meningitis   ? at age 73 weeks old  ? PE (pulmonary embolism) 2005  ? hx post freq travel-2005  ? Umbilical hernia   ?  ?Family History  ?Adopted: Yes  ?  ?Past Surgical History:  ?Procedure Laterality Date  ? BACK SURGERY    ? lumb lam  ? CARPAL TUNNEL RELEASE  08/27/2012  ? Procedure: CARPAL TUNNEL RELEASE;  Surgeon: Roseanne Kaufman, MD;  Location: [facility];  Service: Orthopedics;  Laterality: Right;  Right Limited Open Carpal Tunnel Release   ? COLONOSCOPY    ? I & D EXTREMITY  09/07/2012  ? Procedure: IRRIGATION AND DEBRIDEMENT EXTREMITY;  Surgeon: Linna Hoff, MD;  Location: [facility];  Service: Orthopedics;  Laterality: Right;  ? INSERTION OF MESH N/A 05/12/2015  ? Procedure: INSERTION OF MESH;  Surgeon: Ralene Ok, MD;  Location: [facility];  Service: General;  Laterality: N/A;  ? [place]  ? lt knee reconstruction hign [phone]  ? KNEE ARTHROSCOPY    ? x3  ? STERIOD INJECTION  08/27/2012  ? Procedure: STEROID INJECTION;  Surgeon: Roseanne Kaufman, MD;  Location: [facility];  Service: Orthopedics;  Laterality: Left;  ? TOTAL HIP ARTHROPLASTY  2012  ? left  ? TOTAL KNEE ARTHROPLASTY  2007  ? rt  ? TOTAL KNEE REVISION  2011  ? rt  ? UMBILICAL HERNIA REPAIR N/A 05/12/2015  ? Procedure: LAPAROSCOPIC UMBILICAL HERNIA REPAIR;  Surgeon: Ralene Ok, MD;  Location: [facility];  Service: General;  Laterality: N/A;  ? WRIST ARTHROSCOPY  2006  ? debrid-rt  ? ?Social History  ? ?Occupational History  ? Not on file  ?Tobacco Use  ? Smoking status: Former  ?  Types: Pipe  ? Smokeless tobacco: Never  ? Tobacco comments:  ?  " quit a couple of years ago"( 05/11/15)  ?Vaping Use  ? Vaping Use: Never used  ?Substance and Sexual  Activity  ? Alcohol use: Yes  ?  Comment: occasional  ? Drug use: No  ? Sexual activity: Yes  ?  Birth control/protection: None  ?  Comment: smokes pipe-occ cigar  ? ? ? ? ? ?

## 2021-12-06 ENCOUNTER — Other Ambulatory Visit (HOSPITAL_COMMUNITY): Payer: Self-pay

## 2021-12-07 ENCOUNTER — Ambulatory Visit
Admission: RE | Admit: 2021-12-07 | Discharge: 2021-12-07 | Disposition: A | Discharge status: Home / Self Care | Payer: 59 | Source: Ambulatory Visit | Attending: Orthopedic Surgery | Admitting: Orthopedic Surgery

## 2021-12-07 DIAGNOSIS — M19011 Primary osteoarthritis, right shoulder: Secondary | ICD-10-CM | POA: Diagnosis not present

## 2021-12-07 DIAGNOSIS — M25511 Pain in right shoulder: Secondary | ICD-10-CM

## 2021-12-13 ENCOUNTER — Telehealth: Payer: Self-pay | Admitting: Orthopedic Surgery

## 2021-12-13 NOTE — Progress Notes (Signed)
Pls call josh and let him know scan is in thx  may be rsa or tsa

## 2021-12-13 NOTE — Telephone Encounter (Signed)
[employer] forms received for spouse. To [employer]. ?

## 2021-12-14 ENCOUNTER — Other Ambulatory Visit: Payer: Self-pay

## 2021-12-14 ENCOUNTER — Other Ambulatory Visit (HOSPITAL_COMMUNITY): Payer: Self-pay

## 2021-12-14 MED ORDER — SEMAGLUTIDE-WEIGHT MANAGEMENT 0.5 MG/0.5ML ~~LOC~~ SOAJ
0.5000 mg | SUBCUTANEOUS | 2 refills | Status: DC
  Filled 2021-12-14: qty 2, 28d supply, fill #0

## 2021-12-15 ENCOUNTER — Other Ambulatory Visit (HOSPITAL_COMMUNITY): Payer: Self-pay

## 2021-12-20 ENCOUNTER — Other Ambulatory Visit (HOSPITAL_COMMUNITY): Payer: Self-pay

## 2021-12-22 ENCOUNTER — Other Ambulatory Visit (HOSPITAL_COMMUNITY): Payer: Self-pay

## 2021-12-22 MED ORDER — ROSUVASTATIN CALCIUM 5 MG PO TABS
5.0000 mg | ORAL_TABLET | Freq: Every day | ORAL | 4 refills | Status: DC
  Filled 2021-12-22: qty 90, 90d supply, fill #0
  Filled 2022-03-29: qty 90, 90d supply, fill #1
  Filled 2022-06-21: qty 90, 90d supply, fill #2
  Filled 2022-09-19: qty 90, 90d supply, fill #3

## 2021-12-23 ENCOUNTER — Other Ambulatory Visit (HOSPITAL_COMMUNITY): Payer: Self-pay

## 2021-12-29 ENCOUNTER — Telehealth: Payer: Self-pay | Admitting: Orthopedic Surgery

## 2021-12-29 NOTE — Telephone Encounter (Signed)
Received $25.00 cash, medical records release form/ Forwarding to [place] today ?

## 2022-01-03 ENCOUNTER — Other Ambulatory Visit (HOSPITAL_COMMUNITY): Payer: Self-pay

## 2022-01-04 ENCOUNTER — Other Ambulatory Visit (HOSPITAL_COMMUNITY): Payer: Self-pay

## 2022-01-05 ENCOUNTER — Other Ambulatory Visit (HOSPITAL_COMMUNITY): Payer: Self-pay

## 2022-01-05 MED ORDER — METHOCARBAMOL 500 MG PO TABS
500.0000 mg | ORAL_TABLET | Freq: Two times a day (BID) | ORAL | 3 refills | Status: DC
  Filled 2022-01-05: qty 60, 30d supply, fill #0
  Filled 2022-03-04: qty 60, 30d supply, fill #1
  Filled 2022-04-01: qty 60, 30d supply, fill #2
  Filled 2022-05-01: qty 60, 30d supply, fill #3

## 2022-01-06 ENCOUNTER — Other Ambulatory Visit (HOSPITAL_COMMUNITY): Payer: Self-pay

## 2022-01-09 ENCOUNTER — Other Ambulatory Visit (HOSPITAL_COMMUNITY): Payer: Self-pay

## 2022-01-09 MED ORDER — SEMAGLUTIDE-WEIGHT MANAGEMENT 1 MG/0.5ML ~~LOC~~ SOAJ
1.0000 mg | SUBCUTANEOUS | 0 refills | Status: DC
  Filled 2022-01-09: qty 2, 28d supply, fill #0

## 2022-01-10 NOTE — Telephone Encounter (Signed)
Typically will just use a machine to regain ROM for the first 2 weeks at home and then at 2 weeks, get set up for PT for 1-2x per week for 6-8 weeks

## 2022-01-10 NOTE — Telephone Encounter (Signed)
Patients spouse Elita Quick needs to know how often patient will be going to rehab after surgery so she can out in time at work and figure out accommodations for him. Please call [phone] ?

## 2022-01-11 NOTE — Telephone Encounter (Signed)
IC advised. Verbalized understanding.  

## 2022-01-12 ENCOUNTER — Other Ambulatory Visit (HOSPITAL_COMMUNITY): Payer: Self-pay

## 2022-01-17 NOTE — Progress Notes (Signed)
Surgical Instructions ? ? ? Your procedure is scheduled on 01/24/22. ? Report to [facility] Main Entrance "A" at 5:30 A.M., then check in with the Admitting office. ? Call this number if you have problems the morning of surgery: ? [phone] ? ? If you have any questions prior to your surgery date call [phone]: Open Monday-Friday 8am-4pm ? ? ? Remember: ? Do not eat after midnight the night before your surgery ? ?You may drink clear liquids until 4:30am the morning of your surgery.   ?Clear liquids allowed are: Water, Non-Citrus Juices (without pulp), Carbonated Beverages, Clear Tea, Black Coffee ONLY (NO MILK, CREAM OR POWDERED CREAMER of any kind), and Gatorade ? ?Patient Instructions ? ?The night before surgery:  ?No food after midnight. ONLY clear liquids after midnight ? ?The day of surgery (if you do NOT have diabetes):  ?Drink ONE (1) Pre-Surgery Clear Ensure by 4:30am the morning of surgery. Drink in one sitting. Do not sip.  ?This drink was given to you during your hospital  ?pre-op appointment visit. ?Nothing else to drink after completing the  ?Pre-Surgery Clear Ensure. ? ? ? ?       If you have questions, please contact your surgeon?s office. ? ?  ? Take these medicines the morning of surgery with A SIP OF WATER:  ?doxycycline (VIBRA-TABS) ?SYNTHROID  ? ?IF NEEDED: ?colchicine  ?methocarbamol (ROBAXIN) ? ?As of today, STOP taking any Aspirin (unless otherwise instructed by your surgeon) Aleve, Naproxen, Ibuprofen, Motrin, Advil, Goody's, BC's, all herbal medications, celecoxib (CELEBREX), fish oil, and all vitamins. ? ?Please follow your surgeons instructions on when to stop apixaban (ELIQUIS). ? ?WHAT DO I DO ABOUT MY DIABETES MEDICATION? ? ? ?Do not take oral diabetes medicines (pills) the morning of surgery. ? ?THE MORNING OF SURGERY, do not take Semaglutide-Weight Management 1 MG/0.5ML SOAJ. ? ?The day of surgery, do not take other diabetes injectables, including Byetta (exenatide), Bydureon  (exenatide ER), Victoza (liraglutide), or Trulicity (dulaglutide). ? ?If your CBG is greater than 220 mg/dL, you may take ? of your sliding scale (correction) dose of insulin. ? ? ?HOW TO MANAGE YOUR DIABETES ?BEFORE AND AFTER SURGERY ? ?Why is it important to control my blood sugar before and after surgery? ?Improving blood sugar levels before and after surgery helps healing and can limit problems. ?A way of improving blood sugar control is eating a healthy diet by: ? Eating less sugar and carbohydrates ? Increasing activity/exercise ? Talking with your doctor about reaching your blood sugar goals ?High blood sugars (greater than 180 mg/dL) can raise your risk of infections and slow your recovery, so you will need to focus on controlling your diabetes during the weeks before surgery. ?Make sure that the doctor who takes care of your diabetes knows about your planned surgery including the date and location. ? ?How do I manage my blood sugar before surgery? ?Check your blood sugar at least 4 times a day, starting 2 days before surgery, to make sure that the level is not too high or low. ? ?Check your blood sugar the morning of your surgery when you wake up and every 2 hours until you get to the Short Stay unit. ? ?If your blood sugar is less than 70 mg/dL, you will need to treat for low blood sugar: ?Do not take insulin. ?Treat a low blood sugar (less than 70 mg/dL) with ? cup of clear juice (cranberry or apple), 4 glucose tablets, OR glucose gel. ?Recheck blood sugar in 15  minutes after treatment (to make sure it is greater than 70 mg/dL). If your blood sugar is not greater than 70 mg/dL on recheck, call [phone] for further instructions. ?Report your blood sugar to the short stay nurse when you get to Short Stay. ? ?If you are admitted to the hospital after surgery: ?Your blood sugar will be checked by the staff and you will probably be given insulin after surgery (instead of oral diabetes medicines) to make  sure you have good blood sugar levels. ?The goal for blood sugar control after surgery is 80-180 mg/dL. ?      ?Do not wear jewelry or makeup ?Do not wear lotions, powders, perfumes/colognes, or deodorant. ?Men may shave face and neck. ?Do not bring valuables to the hospital. ?Do not wear nail polish, gel polish, artificial nails, or any other type of covering on natural nails (fingers and toes) ?If you have artificial nails or gel coating that need to be removed by a nail salon, please have this removed prior to surgery. Artificial nails or gel coating may interfere with anesthesia's ability to adequately monitor your vital signs. ? ?[facility] is not responsible for any belongings or valuables. .  ? ?Do NOT Smoke (Tobacco/Vaping)  24 hours prior to your procedure ? ?If you use a CPAP at night, you may bring your mask for your overnight stay. ?  ?Contacts, glasses, hearing aids, dentures or partials may not be worn into surgery, please bring cases for these belongings ?  ?For patients admitted to the hospital, discharge time will be determined by your treatment team. ?  ?Patients discharged the day of surgery will not be allowed to drive home, and someone needs to stay with them for 24 hours. ? ? ?SURGICAL WAITING ROOM VISITATION ?Patients having surgery or a procedure in a hospital may have two support people. ?Children under the age of 26 must have an adult with them who is not the patient. ?They may stay in the waiting area during the procedure and may switch out with other visitors. If the patient needs to stay at the hospital during part of their recovery, the visitor guidelines for inpatient rooms apply. ? ?Please refer to the [facility] website for the visitor guidelines for Inpatients (after your surgery is over and you are in a regular room).  ? ? ? ? ?Oral Hygiene is also important to reduce your risk of infection.  Remember - BRUSH YOUR TEETH THE MORNING OF SURGERY WITH YOUR REGULAR TOOTHPASTE ? ?[facility]- Preparing for Total Shoulder Arthroplasty ? ?Before surgery, you can play an important role. Because skin is not sterile, your skin needs to be as free of germs as possible. You can reduce the number of germs on your skin by using the following products. ? ?? Benzoyl Peroxide Gel ? ?o Reduces the number of germs present on the skin ? ?o Applied twice a day to shoulder area starting two days before surgery ? ?? Chlorhexidine Gluconate (CHG) Soap (instructions listed above on how to wash with CHG Soap) ? ?o An antiseptic cleaner that kills germs and bonds with the skin to continue killing germs even after washing ? ?o Used for showering the night before surgery and morning of surgery ? ? ?================================================================== ? ?Please follow these instructions carefully: ? ?BENZOYL PEROXIDE 5% GEL ? ?Please do not use if you have an allergy to benzoyl peroxide. If your skin becomes reddened/irritated stop using the benzoyl peroxide. ? ?Starting two days before surgery, apply as  follows: ? ?1. Apply benzoyl peroxide in the morning and at night. Apply after taking a shower. If you are not taking a shower clean entire shoulder front, back, and side along with the armpit with a clean wet washcloth. ? ?2. Place a quarter-sized dollop on your SHOULDER and rub in thoroughly, making sure to cover the front, back, and side of your shoulder, along with the armpit. ? ? 2 Days prior to Surgery ?First Dose on 01/22/22 Sunday Morning ?Second Dose on 01/22/22 Sunday Night ? ?Day Before Surgery ?First Dose on 01/23/22 Monday Morning ?Night before surgery wash (entire body except face and private areas) with CHG Soap THEN ?Second Dose on 01/23/22 Monday Night  ? ?Morning of Surgery  ?wash BODY AGAIN with CHG Soap ? ? ?4. Do NOT apply benzoyl peroxide gel on the day of surgery ? ? ?[facility]- Preparing For Surgery ? ?Before surgery, you can play an important role. Because skin is not sterile, your  skin needs to be as free of germs as possible. You can reduce the number of germs on your skin by washing with CHG (chlorahexidine gluconate) Soap before surgery.  CHG is an antiseptic cleaner which kills ge

## 2022-01-18 ENCOUNTER — Encounter (HOSPITAL_COMMUNITY): Payer: Self-pay

## 2022-01-18 ENCOUNTER — Other Ambulatory Visit: Payer: Self-pay

## 2022-01-18 ENCOUNTER — Encounter (HOSPITAL_COMMUNITY)
Admission: RE | Admit: 2022-01-18 | Discharge: 2022-01-18 | Disposition: A | Discharge status: Home / Self Care | Payer: 59 | Source: Ambulatory Visit | Attending: Orthopedic Surgery | Admitting: Orthopedic Surgery

## 2022-01-18 ENCOUNTER — Other Ambulatory Visit (HOSPITAL_COMMUNITY): Payer: Self-pay

## 2022-01-18 VITALS — BP 143/96 | HR 86 | Temp 98.3°F | Resp 17 | Ht 69.0 in | Wt 207.8 lb

## 2022-01-18 DIAGNOSIS — Z01818 Encounter for other preprocedural examination: Secondary | ICD-10-CM | POA: Insufficient documentation

## 2022-01-18 HISTORY — DX: Hypothyroidism, unspecified: E03.9

## 2022-01-18 LAB — URINALYSIS, ROUTINE W REFLEX MICROSCOPIC
Bilirubin Urine: NEGATIVE
Glucose, UA: NEGATIVE mg/dL
Hgb urine dipstick: NEGATIVE
Ketones, ur: NEGATIVE mg/dL
Leukocytes,Ua: NEGATIVE
Nitrite: NEGATIVE
Protein, ur: NEGATIVE mg/dL
Specific Gravity, Urine: 1.015 (ref 1.005–1.030)
pH: 5 (ref 5.0–8.0)

## 2022-01-18 LAB — BASIC METABOLIC PANEL
Anion gap: 9 (ref 5–15)
BUN: 13 mg/dL (ref 8–23)
CO2: 23 mmol/L (ref 22–32)
Calcium: 9.8 mg/dL (ref 8.9–10.3)
Chloride: 107 mmol/L (ref 98–111)
Creatinine, Ser: 1.05 mg/dL (ref 0.61–1.24)
GFR, Estimated: >60 mL/min (ref >60)
Glucose, Bld: 93 mg/dL (ref 70–99)
Potassium: 4.3 mmol/L (ref 3.5–5.1)
Sodium: 139 mmol/L (ref 135–145)

## 2022-01-18 LAB — CBC
HCT: 48.7 % (ref 39.0–52.0)
Hemoglobin: 16.2 g/dL (ref 13.0–17.0)
MCH: 29.4 pg (ref 26.0–34.0)
MCHC: 33.3 g/dL (ref 30.0–36.0)
MCV: 88.4 fL (ref 80.0–100.0)
Platelets: 200 10*3/uL (ref 150–400)
RBC: 5.51 MIL/uL (ref 4.22–5.81)
RDW: 14.6 % (ref 11.5–15.5)
WBC: 8.9 10*3/uL (ref 4.0–10.5)
nRBC: 0 % (ref 0.0–0.2)

## 2022-01-18 LAB — SURGICAL PCR SCREEN
MRSA, PCR: NEGATIVE
Staphylococcus aureus: NEGATIVE

## 2022-01-18 NOTE — Progress Notes (Signed)
PCP - richard tisovec ?Cardiologist - Kirk Ruths ?Gastroenterology: Scarlette Shorts ? ?PPM/ICD - denies ? ? ?Chest x-ray - n/a ?EKG - 01/18/22 ?Stress Test - over 15 years ago ?ECHO - denies ?Cardiac Cath - denies ? ?Sleep Study - denies ? ?As of today, STOP taking any Aspirin (unless otherwise instructed by your surgeon) Aleve, Naproxen, Ibuprofen, Motrin, Advil, Goody's, BC's, all herbal medications, celecoxib (CELEBREX), fish oil, and all vitamins. ?  ?Please follow your surgeons instructions on when to stop apixaban (ELIQUIS). ? ?ERAS Protcol -yes ?PRE-SURGERY Ensure or G2- g2 given ? ?COVID TEST- not needed ? ? ?Anesthesia review: no ? ?Patient denies shortness of breath, fever, cough and chest pain at PAT appointment ? ? ?All instructions explained to the patient, with a verbal understanding of the material. Patient agrees to go over the instructions while at home for a better understanding. Patient also instructed to self quarantine after being tested for COVID-19. The opportunity to ask questions was provided. ? ? ?

## 2022-01-19 LAB — URINE CULTURE: Culture: NO GROWTH

## 2022-01-23 ENCOUNTER — Encounter (HOSPITAL_COMMUNITY): Payer: Self-pay | Admitting: Orthopedic Surgery

## 2022-01-23 NOTE — Anesthesia Preprocedure Evaluation (Addendum)
Anesthesia Evaluation  ?Patient identified by MRN, date of birth, ID band ?Patient awake ? ? ? ?Reviewed: ?Allergy & Precautions, H&P , NPO status , Patient's Chart, lab work & pertinent test results ? ?Airway ?Mallampati: II ? ?TM Distance: >3 FB ?Neck ROM: Full ? ? ? Dental ?no notable dental hx. ?(+) Teeth Intact, Dental Advisory Given ?  ?Pulmonary ?neg pulmonary ROS, former smoker,  ?  ?Pulmonary exam normal ?breath sounds clear to auscultation ? ? ? ? ? ? Cardiovascular ?Exercise Tolerance: Good ?negative cardio ROS ? ? ?Rhythm:Regular Rate:Normal ? ? ?  ?Neuro/Psych ?negative neurological ROS ? negative psych ROS  ? GI/Hepatic ?negative GI ROS, Neg liver ROS,   ?Endo/Other  ?Hypothyroidism  ? Renal/GU ?negative Renal ROS  ?negative genitourinary ?  ?Musculoskeletal ? ?(+) Arthritis , Osteoarthritis,   ? Abdominal ?  ?Peds ? Hematology ?negative hematology ROS ?(+)   ?Anesthesia Other Findings ? ? Reproductive/Obstetrics ?negative OB ROS ? ?  ? ? ? ? ? ? ? ? ? ? ? ? ? ?  ?  ? ? ? ? ? ? ? ?Anesthesia Physical ?Anesthesia Plan ? ?ASA: 2 ? ?Anesthesia Plan: General  ? ?Post-op Pain Management: Regional block* and Tylenol PO (pre-op)*  ? ?Induction: Intravenous ? ?PONV Risk Score and Plan: 3 and Ondansetron, Dexamethasone and Midazolam ? ?Airway Management Planned: Oral ETT ? ?Additional Equipment:  ? ?Intra-op Plan:  ? ?Post-operative Plan: Extubation in OR ? ?Informed Consent: I have reviewed the patients History and Physical, chart, labs and discussed the procedure including the risks, benefits and alternatives for the proposed anesthesia with the patient or authorized representative who has indicated his/her understanding and acceptance.  ? ? ? ?Dental advisory given ? ?Plan Discussed with: CRNA ? ?Anesthesia Plan Comments:   ? ? ? ? ? ? ?Anesthesia Quick Evaluation ? ?

## 2022-01-24 ENCOUNTER — Ambulatory Visit (HOSPITAL_COMMUNITY): Payer: 59

## 2022-01-24 ENCOUNTER — Ambulatory Visit (HOSPITAL_BASED_OUTPATIENT_CLINIC_OR_DEPARTMENT_OTHER): Payer: 59 | Admitting: Anesthesiology

## 2022-01-24 ENCOUNTER — Ambulatory Visit (HOSPITAL_COMMUNITY): Payer: 59 | Admitting: Anesthesiology

## 2022-01-24 ENCOUNTER — Encounter (HOSPITAL_COMMUNITY)
Admission: RE | Disposition: A | Discharge status: Home / Self Care | Payer: Self-pay | Source: Home / Self Care | Attending: Orthopedic Surgery

## 2022-01-24 ENCOUNTER — Other Ambulatory Visit: Payer: Self-pay

## 2022-01-24 ENCOUNTER — Observation Stay (HOSPITAL_COMMUNITY)
Admission: RE | Admit: 2022-01-24 | Discharge: 2022-01-25 | Disposition: A | Discharge status: Home / Self Care | Payer: 59 | Attending: Orthopedic Surgery | Admitting: Orthopedic Surgery

## 2022-01-24 DIAGNOSIS — Z96642 Presence of left artificial hip joint: Secondary | ICD-10-CM | POA: Insufficient documentation

## 2022-01-24 DIAGNOSIS — Z79899 Other long term (current) drug therapy: Secondary | ICD-10-CM | POA: Insufficient documentation

## 2022-01-24 DIAGNOSIS — Z86711 Personal history of pulmonary embolism: Secondary | ICD-10-CM | POA: Diagnosis not present

## 2022-01-24 DIAGNOSIS — Z7901 Long term (current) use of anticoagulants: Secondary | ICD-10-CM | POA: Diagnosis not present

## 2022-01-24 DIAGNOSIS — Z87891 Personal history of nicotine dependence: Secondary | ICD-10-CM | POA: Diagnosis not present

## 2022-01-24 DIAGNOSIS — E039 Hypothyroidism, unspecified: Secondary | ICD-10-CM | POA: Insufficient documentation

## 2022-01-24 DIAGNOSIS — Z96611 Presence of right artificial shoulder joint: Secondary | ICD-10-CM

## 2022-01-24 DIAGNOSIS — G8918 Other acute postprocedural pain: Secondary | ICD-10-CM | POA: Diagnosis not present

## 2022-01-24 DIAGNOSIS — M19011 Primary osteoarthritis, right shoulder: Secondary | ICD-10-CM | POA: Diagnosis not present

## 2022-01-24 DIAGNOSIS — Z96651 Presence of right artificial knee joint: Secondary | ICD-10-CM | POA: Diagnosis not present

## 2022-01-24 DIAGNOSIS — Z01818 Encounter for other preprocedural examination: Secondary | ICD-10-CM

## 2022-01-24 HISTORY — PX: REVERSE SHOULDER ARTHROPLASTY: SHX5054

## 2022-01-24 SURGERY — ARTHROPLASTY, SHOULDER, TOTAL, REVERSE
Anesthesia: General | Site: Shoulder | Laterality: Right

## 2022-01-24 MED ORDER — SUGAMMADEX SODIUM 200 MG/2ML IV SOLN
INTRAVENOUS | Status: DC | PRN
  Administered 2022-01-24: 200 mg via INTRAVENOUS

## 2022-01-24 MED ORDER — MENTHOL 3 MG MT LOZG: 1.0000 | LOZENGE | OROMUCOSAL | Status: DC | PRN

## 2022-01-24 MED ORDER — VANCOMYCIN HCL 1000 MG IV SOLR
INTRAVENOUS | Status: DC | PRN
  Administered 2022-01-24: 1000 mg

## 2022-01-24 MED ORDER — MIDAZOLAM HCL 2 MG/2ML IJ SOLN
INTRAMUSCULAR | Status: AC
  Filled 2022-01-24: qty 2

## 2022-01-24 MED ORDER — FENTANYL CITRATE (PF) 250 MCG/5ML IJ SOLN
INTRAMUSCULAR | Status: DC | PRN
  Administered 2022-01-24: 100 ug via INTRAVENOUS
  Administered 2022-01-24: 25 ug via INTRAVENOUS

## 2022-01-24 MED ORDER — CEFAZOLIN SODIUM-DEXTROSE 2-4 GM/100ML-% IV SOLN
2.0000 g | Freq: Three times a day (TID) | INTRAVENOUS | Status: AC
  Administered 2022-01-24 – 2022-01-25 (×3): 2 g via INTRAVENOUS
  Filled 2022-01-24 (×3): qty 100

## 2022-01-24 MED ORDER — IRRISEPT - 450ML BOTTLE WITH 0.05% CHG IN STERILE WATER, USP 99.95% OPTIME
TOPICAL | Status: DC | PRN
  Administered 2022-01-24: 450 mL via TOPICAL

## 2022-01-24 MED ORDER — PHENYLEPHRINE 80 MCG/ML (10ML) SYRINGE FOR IV PUSH (FOR BLOOD PRESSURE SUPPORT)
PREFILLED_SYRINGE | INTRAVENOUS | Status: DC | PRN
  Administered 2022-01-24 (×2): 80 ug via INTRAVENOUS

## 2022-01-24 MED ORDER — TRANEXAMIC ACID-NACL 1000-0.7 MG/100ML-% IV SOLN
INTRAVENOUS | Status: AC
  Filled 2022-01-24: qty 100

## 2022-01-24 MED ORDER — ONDANSETRON HCL 4 MG/2ML IJ SOLN: 4.0000 mg | Freq: Four times a day (QID) | INTRAMUSCULAR | Status: DC | PRN

## 2022-01-24 MED ORDER — OXYCODONE HCL 5 MG PO TABS
5.0000 mg | ORAL_TABLET | ORAL | Status: DC | PRN
  Administered 2022-01-24 – 2022-01-25 (×6): 10 mg via ORAL
  Filled 2022-01-24 (×6): qty 2

## 2022-01-24 MED ORDER — PROPOFOL 10 MG/ML IV BOLUS
INTRAVENOUS | Status: AC
  Filled 2022-01-24: qty 20

## 2022-01-24 MED ORDER — METOCLOPRAMIDE HCL 5 MG PO TABS: 5.0000 mg | ORAL_TABLET | Freq: Three times a day (TID) | ORAL | Status: DC | PRN

## 2022-01-24 MED ORDER — DOCUSATE SODIUM 100 MG PO CAPS
100.0000 mg | ORAL_CAPSULE | Freq: Two times a day (BID) | ORAL | Status: DC
  Administered 2022-01-24 – 2022-01-25 (×3): 100 mg via ORAL
  Filled 2022-01-24 (×3): qty 1

## 2022-01-24 MED ORDER — POVIDONE-IODINE 10 % EX SWAB
2.0000 "application " | Freq: Once | CUTANEOUS | Status: AC
  Administered 2022-01-24: 2 via TOPICAL

## 2022-01-24 MED ORDER — HYDROMORPHONE HCL 1 MG/ML IJ SOLN: 0.2500 mg | INTRAMUSCULAR | Status: DC | PRN

## 2022-01-24 MED ORDER — ACETAMINOPHEN 500 MG PO TABS: 1000.0000 mg | ORAL_TABLET | Freq: Once | ORAL | Status: AC

## 2022-01-24 MED ORDER — ROPIVACAINE HCL 5 MG/ML IJ SOLN
INTRAMUSCULAR | Status: DC | PRN
  Administered 2022-01-24: 10 mL

## 2022-01-24 MED ORDER — CHLORHEXIDINE GLUCONATE 0.12 % MT SOLN
OROMUCOSAL | Status: AC
  Administered 2022-01-24: 15 mL via OROMUCOSAL
  Filled 2022-01-24: qty 15

## 2022-01-24 MED ORDER — DEXAMETHASONE SODIUM PHOSPHATE 10 MG/ML IJ SOLN
INTRAMUSCULAR | Status: DC | PRN
Start: 2022-01-24 — End: 2022-01-24
  Administered 2022-01-24: 10 mg via INTRAVENOUS

## 2022-01-24 MED ORDER — TRANEXAMIC ACID-NACL 1000-0.7 MG/100ML-% IV SOLN: 1000.0000 mg | INTRAVENOUS | Status: DC

## 2022-01-24 MED ORDER — CHLORHEXIDINE GLUCONATE 0.12 % MT SOLN: 15.0000 mL | Freq: Once | OROMUCOSAL | Status: AC

## 2022-01-24 MED ORDER — CEFAZOLIN SODIUM-DEXTROSE 2-4 GM/100ML-% IV SOLN
2.0000 g | INTRAVENOUS | Status: AC
  Administered 2022-01-24: 2 g via INTRAVENOUS

## 2022-01-24 MED ORDER — BUPIVACAINE-EPINEPHRINE (PF) 0.5% -1:200000 IJ SOLN
INTRAMUSCULAR | Status: DC | PRN
  Administered 2022-01-24: 15 mL via PERINEURAL

## 2022-01-24 MED ORDER — FENTANYL CITRATE (PF) 250 MCG/5ML IJ SOLN
INTRAMUSCULAR | Status: AC
  Filled 2022-01-24: qty 5

## 2022-01-24 MED ORDER — ONDANSETRON HCL 4 MG PO TABS: 4.0000 mg | ORAL_TABLET | Freq: Four times a day (QID) | ORAL | Status: DC | PRN

## 2022-01-24 MED ORDER — BUPIVACAINE LIPOSOME 1.3 % IJ SUSP
INTRAMUSCULAR | Status: DC | PRN
  Administered 2022-01-24: 10 mL via PERINEURAL

## 2022-01-24 MED ORDER — METOCLOPRAMIDE HCL 5 MG/ML IJ SOLN: 5.0000 mg | Freq: Three times a day (TID) | INTRAMUSCULAR | Status: DC | PRN

## 2022-01-24 MED ORDER — EPHEDRINE SULFATE-NACL 50-0.9 MG/10ML-% IV SOSY
PREFILLED_SYRINGE | INTRAVENOUS | Status: DC | PRN
  Administered 2022-01-24: 2.5 mg via INTRAVENOUS

## 2022-01-24 MED ORDER — 0.9 % SODIUM CHLORIDE (POUR BTL) OPTIME
TOPICAL | Status: DC | PRN
  Administered 2022-01-24 (×6): 1000 mL

## 2022-01-24 MED ORDER — ACETAMINOPHEN 500 MG PO TABS
ORAL_TABLET | ORAL | Status: AC
  Administered 2022-01-24: 1000 mg via ORAL
  Filled 2022-01-24: qty 2

## 2022-01-24 MED ORDER — METHOCARBAMOL 1000 MG/10ML IJ SOLN: 500.0000 mg | Freq: Four times a day (QID) | INTRAVENOUS | Status: DC | PRN

## 2022-01-24 MED ORDER — ONDANSETRON HCL 4 MG/2ML IJ SOLN
INTRAMUSCULAR | Status: DC | PRN
  Administered 2022-01-24: 4 mg via INTRAVENOUS

## 2022-01-24 MED ORDER — ORAL CARE MOUTH RINSE: 15.0000 mL | Freq: Once | OROMUCOSAL | Status: AC

## 2022-01-24 MED ORDER — PHENOL 1.4 % MT LIQD: 1.0000 | OROMUCOSAL | Status: DC | PRN

## 2022-01-24 MED ORDER — MIDAZOLAM HCL 2 MG/2ML IJ SOLN
INTRAMUSCULAR | Status: DC | PRN
Start: 2022-01-24 — End: 2022-01-24
  Administered 2022-01-24: 2 mg via INTRAVENOUS

## 2022-01-24 MED ORDER — METHOCARBAMOL 500 MG PO TABS
500.0000 mg | ORAL_TABLET | Freq: Four times a day (QID) | ORAL | Status: DC | PRN
  Administered 2022-01-24 – 2022-01-25 (×3): 500 mg via ORAL
  Filled 2022-01-24 (×3): qty 1

## 2022-01-24 MED ORDER — VANCOMYCIN HCL 1000 MG IV SOLR
INTRAVENOUS | Status: AC
  Filled 2022-01-24: qty 20

## 2022-01-24 MED ORDER — APIXABAN 5 MG PO TABS
5.0000 mg | ORAL_TABLET | Freq: Two times a day (BID) | ORAL | Status: DC
  Filled 2022-01-24 (×3): qty 1

## 2022-01-24 MED ORDER — HYDROMORPHONE HCL 1 MG/ML IJ SOLN
0.5000 mg | INTRAMUSCULAR | Status: DC | PRN
  Filled 2022-01-24: qty 0.5

## 2022-01-24 MED ORDER — ROCURONIUM BROMIDE 10 MG/ML (PF) SYRINGE
PREFILLED_SYRINGE | INTRAVENOUS | Status: DC | PRN
  Administered 2022-01-24: 70 mg via INTRAVENOUS

## 2022-01-24 MED ORDER — ROSUVASTATIN CALCIUM 5 MG PO TABS
5.0000 mg | ORAL_TABLET | Freq: Every day | ORAL | Status: DC
  Administered 2022-01-25: 5 mg via ORAL
  Filled 2022-01-24: qty 1

## 2022-01-24 MED ORDER — ACETAMINOPHEN 325 MG PO TABS
325.0000 mg | ORAL_TABLET | Freq: Four times a day (QID) | ORAL | Status: DC | PRN
  Administered 2022-01-25: 650 mg via ORAL
  Filled 2022-01-24: qty 2

## 2022-01-24 MED ORDER — PROPOFOL 10 MG/ML IV BOLUS
INTRAVENOUS | Status: DC | PRN
  Administered 2022-01-24: 130 mg via INTRAVENOUS

## 2022-01-24 MED ORDER — CEFAZOLIN SODIUM-DEXTROSE 2-4 GM/100ML-% IV SOLN
INTRAVENOUS | Status: AC
  Filled 2022-01-24: qty 100

## 2022-01-24 MED ORDER — SODIUM CHLORIDE 0.9 % IV SOLN: INTRAVENOUS | Status: AC

## 2022-01-24 MED ORDER — LACTATED RINGERS IV SOLN: INTRAVENOUS | Status: DC

## 2022-01-24 MED ORDER — ACETAMINOPHEN 500 MG PO TABS
1000.0000 mg | ORAL_TABLET | Freq: Four times a day (QID) | ORAL | Status: AC
  Administered 2022-01-24 – 2022-01-25 (×2): 1000 mg via ORAL
  Filled 2022-01-24 (×3): qty 2

## 2022-01-24 MED ORDER — LEVOTHYROXINE SODIUM 100 MCG PO TABS
100.0000 ug | ORAL_TABLET | Freq: Every day | ORAL | Status: DC
  Administered 2022-01-25: 100 ug via ORAL
  Filled 2022-01-24 (×2): qty 1

## 2022-01-24 MED ORDER — POVIDONE-IODINE 7.5 % EX SOLN
Freq: Once | CUTANEOUS | Status: DC
  Filled 2022-01-24: qty 118

## 2022-01-24 MED ORDER — PHENYLEPHRINE HCL-NACL 20-0.9 MG/250ML-% IV SOLN
INTRAVENOUS | Status: DC | PRN
  Administered 2022-01-24: 40 ug/min via INTRAVENOUS

## 2022-01-24 SURGICAL SUPPLY — 87 items
ALCOHOL 70% 16 OZ (MISCELLANEOUS) ×2 IMPLANT
BAG COUNTER SPONGE SURGICOUNT (BAG) ×2 IMPLANT
BEARING HUMERAL SHLDER 36M STD (Shoulder) IMPLANT
BIT DRILL 2.7 W/STOP DISP (BIT) ×1 IMPLANT
BIT DRILL QUICK REL 1/8 2PK SL (DRILL) IMPLANT
BIT DRILL TWIST 2.7 (BIT) ×1 IMPLANT
BLADE SAW SGTL 13X75X1.27 (BLADE) ×2 IMPLANT
CHLORAPREP W/TINT 26 (MISCELLANEOUS) ×3 IMPLANT
COMP REV AUG LG W/TAPER/GLENOI (Joint) ×2 IMPLANT
COMPONENT RV AUG LG W/TAPR/GLN (Joint) IMPLANT
COOLER ICEMAN CLASSIC (MISCELLANEOUS) ×2 IMPLANT
COVER SURGICAL LIGHT HANDLE (MISCELLANEOUS) ×2 IMPLANT
DRAPE INCISE IOBAN 66X45 STRL (DRAPES) ×2 IMPLANT
DRAPE U-SHAPE 47X51 STRL (DRAPES) ×4 IMPLANT
DRILL QUICK RELEASE 1/8 INCH (DRILL) ×2
DRSG AQUACEL AG ADV 3.5X10 (GAUZE/BANDAGES/DRESSINGS) ×2 IMPLANT
ELECT BLADE 4.0 EZ CLEAN MEGAD (MISCELLANEOUS) ×2
ELECT REM PT RETURN 9FT ADLT (ELECTROSURGICAL) ×2
ELECTRODE BLDE 4.0 EZ CLN MEGD (MISCELLANEOUS) ×1 IMPLANT
ELECTRODE REM PT RTRN 9FT ADLT (ELECTROSURGICAL) ×1 IMPLANT
GAUZE SPONGE 4X4 12PLY STRL LF (GAUZE/BANDAGES/DRESSINGS) ×2 IMPLANT
GLENOID SPHERE STD STRL 36MM (Orthopedic Implant) ×1 IMPLANT
GLOVE BIOGEL PI IND STRL 7.0 (GLOVE) ×1 IMPLANT
GLOVE BIOGEL PI IND STRL 8 (GLOVE) ×1 IMPLANT
GLOVE BIOGEL PI INDICATOR 7.0 (GLOVE) ×1
GLOVE BIOGEL PI INDICATOR 8 (GLOVE) ×1
GLOVE ECLIPSE 7.0 STRL STRAW (GLOVE) ×2 IMPLANT
GLOVE ECLIPSE 8.0 STRL XLNG CF (GLOVE) ×2 IMPLANT
GOWN STRL REUS W/ TWL LRG LVL3 (GOWN DISPOSABLE) ×1 IMPLANT
GOWN STRL REUS W/ TWL XL LVL3 (GOWN DISPOSABLE) ×1 IMPLANT
GOWN STRL REUS W/TWL LRG LVL3 (GOWN DISPOSABLE)
GOWN STRL REUS W/TWL XL LVL3 (GOWN DISPOSABLE) ×2
GUIDE MODEL REV SHLD RT (ORTHOPEDIC DISPOSABLE SUPPLIES) ×1 IMPLANT
HYDROGEN PEROXIDE 16OZ (MISCELLANEOUS) ×2 IMPLANT
JET LAVAGE IRRISEPT WOUND (IRRIGATION / IRRIGATOR) ×2
KIT BASIN OR (CUSTOM PROCEDURE TRAY) ×2 IMPLANT
KIT TURNOVER KIT B (KITS) ×2 IMPLANT
LAVAGE JET IRRISEPT WOUND (IRRIGATION / IRRIGATOR) ×1 IMPLANT
LOOP VESSEL MAXI BLUE (MISCELLANEOUS) ×2 IMPLANT
MANIFOLD NEPTUNE II (INSTRUMENTS) ×2 IMPLANT
NDL SUT 6 .5 CRC .975X.05 MAYO (NEEDLE) IMPLANT
NDL TAPERED W/ NITINOL LOOP (MISCELLANEOUS) ×1 IMPLANT
NEEDLE MAYO TAPER (NEEDLE)
NEEDLE TAPERED W/ NITINOL LOOP (MISCELLANEOUS) ×2 IMPLANT
NS IRRIG 1000ML POUR BTL (IV SOLUTION) ×2 IMPLANT
PACK SHOULDER (CUSTOM PROCEDURE TRAY) ×2 IMPLANT
PAD ARMBOARD 7.5X6 YLW CONV (MISCELLANEOUS) ×4 IMPLANT
PAD COLD SHLDR WRAP-ON (PAD) ×2 IMPLANT
PASSER SUT SWANSON 36MM LOOP (INSTRUMENTS) ×2 IMPLANT
PIN HUMERAL STMN 3.2MMX9IN (INSTRUMENTS) ×1 IMPLANT
PIN THREADED REVERSE (PIN) ×1 IMPLANT
REAMER GUIDE BUSHING SURG DISP (MISCELLANEOUS) ×1 IMPLANT
REAMER GUIDE W/SCREW AUG (MISCELLANEOUS) ×1 IMPLANT
RESTRAINT HEAD UNIVERSAL NS (MISCELLANEOUS) ×2 IMPLANT
SCREW BONE LOCKING 4.75X30X3.5 (Screw) ×1 IMPLANT
SCREW BONE STRL 6.5MMX30MM (Screw) ×1 IMPLANT
SCREW LOCKING 4.75MMX15MM (Screw) ×1 IMPLANT
SCREW LOCKING NS 4.75MMX20MM (Screw) ×1 IMPLANT
SCREW LOCKING STRL 4.75X25X3.5 (Screw) ×1 IMPLANT
SHOULDER HUMERAL BEAR 36M STD (Shoulder) ×2 IMPLANT
SLING ARM IMMOBILIZER LRG (SOFTGOODS) ×2 IMPLANT
SOL PREP POV-IOD 4OZ 10% (MISCELLANEOUS) ×2 IMPLANT
SPONGE T-LAP 18X18 ~~LOC~~+RFID (SPONGE) ×2 IMPLANT
STEM HUMERAL STRL 12MMX83MM (Stem) ×1 IMPLANT
STRIP CLOSURE SKIN 1/2X4 (GAUZE/BANDAGES/DRESSINGS) ×3 IMPLANT
SUCTION FRAZIER HANDLE 10FR (MISCELLANEOUS) ×2
SUCTION TUBE FRAZIER 10FR DISP (MISCELLANEOUS) ×1 IMPLANT
SUT BROADBAND TAPE 2PK 1.5 (SUTURE) ×3 IMPLANT
SUT FIBERWIRE #2 38 T-5 BLUE (SUTURE)
SUT MAXBRAID (SUTURE) IMPLANT
SUT MNCRL AB 3-0 PS2 18 (SUTURE) ×2 IMPLANT
SUT SILK 2 0 TIES 10X30 (SUTURE) ×2 IMPLANT
SUT VIC AB 0 CT1 27 (SUTURE) ×8
SUT VIC AB 0 CT1 27XBRD ANBCTR (SUTURE) ×4 IMPLANT
SUT VIC AB 1 CT1 27 (SUTURE) ×4
SUT VIC AB 1 CT1 27XBRD ANBCTR (SUTURE) ×2 IMPLANT
SUT VIC AB 1 CT1 36 (SUTURE) ×1 IMPLANT
SUT VIC AB 2-0 CT1 27 (SUTURE) ×6
SUT VIC AB 2-0 CT1 TAPERPNT 27 (SUTURE) ×3 IMPLANT
SUT VICRYL 0 UR6 27IN ABS (SUTURE) ×4 IMPLANT
SUTURE FIBERWR #2 38 T-5 BLUE (SUTURE) IMPLANT
TOWEL GREEN STERILE (TOWEL DISPOSABLE) ×2 IMPLANT
TRAY FOL W/BAG SLVR 16FR STRL (SET/KITS/TRAYS/PACK) IMPLANT
TRAY FOLEY W/BAG SLVR 16FR LF (SET/KITS/TRAYS/PACK)
TRAY HUM REV SHOULDER STD +6 (Shoulder) ×1 IMPLANT
WATER STERILE IRR 1000ML POUR (IV SOLUTION) ×2 IMPLANT
YANKAUER SUCT BULB TIP NO VENT (SUCTIONS) ×1 IMPLANT

## 2022-01-24 NOTE — H&P (Addendum)
Jason Norton is an 66 y.o. male.   Chief Complaint: Right shoulder pain HPI: Jason Norton is a 66 year old patient with long history of right shoulder pain.  MRI scan from 2013 does show partial-thickness tearing of the rotator cuff supraspinatus primarily.  He is right-hand dominant and denies any history of injury to the right shoulder.  Did play tennis for many years but has not played recently.  The pain in the shoulder wakes him from sleep at night and he has to use pillows to get himself positioned comfortably.  The pain does radiate down to the elbow but time denies any numbness and tingling in the arm.  He also reports some biceps tendon pain.  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  Patient describes the pain as severe.  He describes pain at rest as well as decreased range of motion in the shoulder.  He does take Celebrex for his symptoms.  Jason Norton also has a history of pulmonary embolism and takes chronic anticoagulation.  Recent CT scan has been obtained which shows adequate bone stock but proximal migration of the humeral head along with severe thinning of the supraspinatus tendon with acromiohumeral distance approximately 2 to 3 mm in some places.  All systems reviewed are negative as they relate to the chief complaint within the history of present illness.  Patient denies  fevers or chills.  Past Medical History:  Diagnosis Date   Carpal tunnel syndrome    Clotting disorder (HCC)    Diverticulosis    DJD (degenerative joint disease)    Gout    Hyperlipemia    Hypothyroidism    Meningitis    at age 67 weeks old   PE (pulmonary embolism) 09/12/2003   hx post freq [id number]   Umbilical hernia     Past Surgical History:  Procedure Laterality Date   BACK SURGERY     lumb lam   CARPAL TUNNEL RELEASE   08/27/2012   Procedure: CARPAL TUNNEL RELEASE;  Surgeon: Roseanne Kaufman, MD;  Location: [facility];  Service: Orthopedics;  Laterality: Right;  Right Limited Open Carpal Tunnel Release    COLONOSCOPY     I & D EXTREMITY  09/07/2012   Procedure: IRRIGATION AND DEBRIDEMENT EXTREMITY;  Surgeon: Linna Hoff, MD;  Location: [facility];  Service: Orthopedics;  Laterality: Right;   INSERTION OF MESH N/A 05/12/2015   Procedure: INSERTION OF MESH;  Surgeon: Ralene Ok, MD;  Location: [facility];  Service: General;  Laterality: N/A;   KNEE ARTHROPLASTY Right 09/11/1973   right knee reconstruction hign [id number]   KNEE ARTHROSCOPY Right    x3   STERIOD INJECTION  08/27/2012   Procedure: STEROID INJECTION;  Surgeon: Roseanne Kaufman, MD;  Location: [facility];  Service: Orthopedics;  Laterality: Left;   TOTAL HIP ARTHROPLASTY  09/11/2010   left   TOTAL KNEE ARTHROPLASTY  09/11/2005   rt   TOTAL KNEE REVISION  10/93/2355   rt   UMBILICAL HERNIA REPAIR N/A 05/12/2015   Procedure: LAPAROSCOPIC UMBILICAL HERNIA REPAIR;  Surgeon: Ralene Ok, MD;  Location: [facility];  Service: General;  Laterality: N/A;   WRIST ARTHROSCOPY  09/11/2004   debrid-rt    Family History  Adopted: Yes   Social History:  reports that he has quit smoking. His smoking use  included pipe. He has never used smokeless tobacco. He reports current alcohol use. He reports that he does not use drugs.  Allergies:  Allergies  Allergen Reactions   Sulfa Antibiotics Other (See Comments)    Dry skin, cracking skin    Facility-Administered Medications Prior to Admission  Medication Dose Route Frequency Provider Last Rate Last Admin   0.9 %  sodium chloride infusion  500 mL Intravenous Once Irene Shipper, MD       Medications Prior to Admission  Medication Sig Dispense Refill   allopurinol (ZYLOPRIM) 300 MG tablet Take 1 tablet (300 mg total) by mouth daily. (Patient taking differently: Take 300 mg by  mouth at bedtime.) 90 tablet 4   apixaban (ELIQUIS) 5 MG TABS tablet Take 1 tablet (5 mg total) by mouth 2 (two) times daily. 180 tablet 3   Ascorbic Acid (VITAMIN C) 1000 MG tablet Take 1,000 mg by mouth in the morning.     celecoxib (CELEBREX) 200 MG capsule Take 1 capsule (200 mg total) by mouth daily as needed for pain 90 capsule 0   cholecalciferol (VITAMIN D3) 25 MCG (1000 UNIT) tablet Take 1,000 Units by mouth in the morning.     colchicine 0.6 MG tablet Take 0.6 mg by mouth daily as needed (gout).      doxycycline (VIBRA-TABS) 100 MG tablet Take 100 mg by mouth 2 (two) times daily.     finasteride (PROSCAR) 5 MG tablet Take 1 tablet (5 mg total) by mouth daily. (Patient taking differently: Take 2.5 mg by mouth every evening.) 30 tablet 13   methocarbamol (ROBAXIN) 500 MG tablet Take 1 tablet (500 mg total) by mouth 2 (two) times daily as needed for muscle spasms (Patient taking differently: Take 500-1,000 mg by mouth every 4 (four) hours as needed (back spasms.).) 60 tablet 3   rosuvastatin (CRESTOR) 5 MG tablet Take 1 tablet (5 mg total) by mouth daily. (Patient taking differently: Take 5 mg by mouth every evening.) 90 tablet 4   Semaglutide-Weight Management 1 MG/0.5ML SOAJ Inject 1 mg into the skin once a week. (Patient taking differently: Inject 1 mg into the skin every Sunday.) 2 mL 0   SYNTHROID 100 MCG tablet Take 1 tablet (100 mcg total) by mouth in the morning on an empty stomach, separate by 30 minutes from food or calcium products. (Patient taking differently: Take 100 mcg by mouth daily at 2 am.) 30 tablet 12   vitamin B-12 (CYANOCOBALAMIN) 1000 MCG tablet Take 1,000 mcg by mouth in the morning.     vitamin E 180 MG (400 UNITS) capsule Take 400 Units by mouth in the morning.     Semaglutide-Weight Management 0.25 MG/0.5ML SOAJ Inject 0.25 mg into the skin once a week. (Patient not taking: Reported on 01/12/2022) 2 mL 0   Semaglutide-Weight Management 0.5 MG/0.5ML SOAJ Inject 0.5 mg  into the skin once a week. (Patient not taking: Reported on 01/12/2022) 2 mL 2    No results found for this or any previous visit (from the past 48 hour(s)). No results found.  Review of Systems  Musculoskeletal:  Positive for arthralgias.  All other systems reviewed and are negative.  Blood pressure (!) 124/93, pulse 90, temperature 97.6 F (36.4 C), temperature source Oral, resp. rate 18, height '5\' 9"'$  (1.753 m), weight 92.5 kg, SpO2 94 %. Physical Exam Vitals reviewed.  HENT:     Head: Normocephalic.     Nose: Nose normal.     Mouth/Throat:  Mouth: Mucous membranes are moist.  Eyes:     Pupils: Pupils are equal, round, and reactive to light.  Cardiovascular:     Rate and Rhythm: Normal rate.     Pulses: Normal pulses.  Pulmonary:     Effort: Pulmonary effort is normal.  Abdominal:     General: Abdomen is flat.  Musculoskeletal:     Cervical back: Normal range of motion.  Skin:    General: Skin is warm.     Capillary Refill: Capillary refill takes less than 2 seconds.  Neurological:     General: No focal deficit present.     Mental Status: He is alert.  Psychiatric:        Mood and Affect: Mood normal.  Ortho exam demonstrates passive range of motion on the right of 25/60/70.  Passive range of motion on the left is 30/80/90.  Rotator cuff strength is reasonable to infraspinatus supraspinatus and subscap muscle testing but limited by his diminished range of motion.  Expected amount of coarse grinding is present with passive range of motion on the right-hand side.  No AC joint tenderness is present.  Motor or sensory function to the hand is intact with no Popeye deformity present in the right arm.  Radial pulse intact.  Axillary nerve and deltoid is functional.  Neck range of motion intact.  Both knees and hips have good range of motion with no pain and normal gait.  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  There is superior  migration of the humeral head.  Significant limitation of range of motion is present.  Think that CT scan for preoperative planning purposes does confirm rotator cuff pathology which precludes anatomic shoulder replacement.  Jason Norton's best bet for surgical intervention for the shoulder is reverse shoulder replacement.  The risk and benefits of reverse shoulder replacement are discussed including but not limited to infection nerve and vessel damage instability as well as incomplete restoration of function and incomplete pain relief.  Plan to hold Eliquis for several days prior to surgery per medical recommendation.  Also plan to keep him overnight for 2 more doses of postop IV antibiotics.  The rehabilitation is also discussed.  All questions answered.  Anderson Malta, MD 01/24/2022, 5:58 AM  BMI: Estimated body mass index is 30.13 kg/m as calculated from the following:   Height as of this encounter: '5\' 9"'$  (1.753 m).   Weight as of this encounter: 92.5 kg.  Lab Results  Component Value Date   ALBUMIN 4.6 03/17/2011   Diabetes: Patient does not have a diagnosis of diabetes.     Smoking Status: Social History   Tobacco Use  Smoking Status Former   Types: Pipe  Smokeless Tobacco Never  Tobacco Comments   " quit a couple of years ago"( 05/11/15)   The patient is not currently a tobacco user. Counseling given: Not Answered Tobacco comments: " quit a couple of years ago"( 05/11/15)

## 2022-01-24 NOTE — Transfer of Care (Signed)
Immediate Anesthesia Transfer of Care Note ? ?Patient: Jason Norton ? ?Procedure(s) Performed: RIGHT REVERSE SHOULDER ARTHROPLASTY (Right: Shoulder) ? ?Patient Location: PACU ? ?Anesthesia Type:GA combined with regional for post-op pain ? ?Level of Consciousness: drowsy ? ?Airway & Oxygen Therapy: Patient Spontanous Breathing ? ?Post-op Assessment: Report given to RN and Post -op Vital signs reviewed and stable ? ?Post vital signs: Reviewed and stable ? ?Last Vitals:  ?Vitals Value Taken Time  ?BP 103/74 01/24/22 1125  ?Temp    ?Pulse 84 01/24/22 1127  ?Resp 15 01/24/22 1127  ?SpO2 91 % 01/24/22 1127  ?Vitals shown include unvalidated device data. ? ?Last Pain:  ?Vitals:  ? 01/24/22 0552  ?TempSrc: Oral  ?   ? ?  ? ?Complications: No notable events documented. ?

## 2022-01-24 NOTE — Brief Op Note (Signed)
? ?  01/24/2022 ? ?1:14 PM ? ?PATIENT:  Jason Norton  66 y.o. male ? ?PRE-OPERATIVE DIAGNOSIS:  right shoulder osteoarthritis ? ?POST-OPERATIVE DIAGNOSIS:  right shoulder osteoarthritis ? ?PROCEDURE:  Procedure(s): ?RIGHT REVERSE SHOULDER ARTHROPLASTY ? ?SURGEON:  Surgeon(s): ?Meredith Pel, MD ? ?ASSISTANT: magnant pa ? ?ANESTHESIA:   general ? ?EBL: 100 ml   ? ?Total I/O ?In: 800 [I.V.:800] ?Out: 100 [Blood:100] ? ?BLOOD ADMINISTERED: none ? ?DRAINS: none  ? ?LOCAL MEDICATIONS USED:  none ? ?SPECIMEN:  No Specimen ? ?COUNTS:  YES ? ?TOURNIQUET:  * No tourniquets in log * ? ?DICTATION: .Other Dictation: Dictation Number [id number] ? ?PLAN OF CARE: Admit for overnight observation ? ?PATIENT DISPOSITION:  PACU - hemodynamically stable ? ? ? ? ? ? ? ? ? ? ? ? ?  ?

## 2022-01-24 NOTE — Op Note (Signed)
Jason Norton, [place] MEDICAL RECORD NO: [id number] ACCOUNT NO: [id number] DATE OF BIRTH: May 27, 1956 FACILITY: [facility] LOCATION: [facility] PHYSICIAN: Yetta Barre. Marlou Sa, MD  Operative Report   DATE OF PROCEDURE: 01/24/2022  PREOPERATIVE DIAGNOSIS:  Right shoulder arthritis.  POSTOPERATIVE DIAGNOSIS:  Right shoulder arthritis.  PROCEDURE:  Right reverse shoulder replacement utilizing [employer] components, size 12 mini humeral stem with +6 offset, 40 mm humeral tray and 36 mm standard liner with large augmented baseplate and 36 standard glenosphere.  SURGEON:  Yetta Barre. Marlou Sa, MD  ASSISTANT:  Annie Main, PA.  INDICATIONS:  The patient is a 66 year old patient with end-stage right shoulder arthritis, who presents for operative management after explanation of risks and benefits.  DESCRIPTION OF PROCEDURE:  The patient was brought to the operating room where general anesthetic was induced.  Preoperative antibiotics administered.  Timeout was called.  The patient was placed in the beach chair position with the head in neutral  position.  Right arm was examined under anesthesia.  Found to have limited external rotation to about 25 degrees, abduction was approximately 50 and forward flexion was around 70. The right shoulder, arm and hand prescrubbed with first hydrogen peroxide  and then alcohol and then Betadine, which was allowed to air dry, then prepped with ChloraPrep solution and draped in sterile manner.  Ioban used to cover the operative field.  Timeout was called.  Deltopectoral approach was made.  IrriSept solution  utilized.  Cephalic vein mobilized medially.  Deltopectoral interval was developed.  Anterior portion of the deltoid were released manually off of its attachment. Subdeltoid adhesions were released.  Axillary nerve was visualized and palpated and  protected at all times during the case.  Kolbel retractor was placed.  The patient had significant fluid within the biceps tendon sheath.   Circumflex vessels were ligated.  Biceps tendon was tenodesed to the pec tendon, which was released about 1.5 cm  from its proximal attachment.  This was done using 4-0 Vicryl sutures.  The biceps tendon was then used to open up the rotator interval all the way to the base of the coracoid.  The coracohumeral ligament also released.  The patient's supraspinatus was  not torn, but very thin, which was compatible with his preoperative CT scan.  Subscap was then detached by using a 15 blade and attaching the subscap from the lesser tuberosity.  This was continued around to the 5 o'clock position using cautery and a 15  blade.  Also, a Cobb elevator to elevate the capsule off the 2 cm of the proximal humerus below the humeral neck.  Once adequate mobilization was performed, the head was dislocated.  Browne retractor placed.  Reaming performed up to size 12, humeral head  was cut in its native version, which was approximately 30 degrees.  Adequate head cut was made to facilitate implant placement.  This is essentially right at the junction of the humeral head and greater tuberosity rotator cuff attachment site interface.   Next, broaching was performed up to a size 12, a cap was placed.  Posterior retractor was then placed.  Subscap was mobilized with circumferential release of the subscap with care being taken to avoid injury to the axillary nerve.  Posterior anterior  and superior retractors placed.  Circumferential removal of the labrum was performed with the patient reversed.  Following this, the patient-specific guide was placed and 2 guide pins were placed in good position and the reaming of the glenoid was  performed.  Reaming was difficult  due to this patient's significantly hard bone.  Reaming for the augment was also performed.  Good contact of the baseplate was achieved.  True baseplate was placed and then one compression screw was placed with good  compression achieved and 4 peripheral locking screws  placed.  Trial reduction was then performed with the 36 standard glenosphere and the +6 offset tray with standard liner.  This gave very good stability to extension and adduction as well as internal  and external rotation at 90 degrees of abduction.  Conjoined tendon and nerve was not tight. This was removed and the shoulder was then redislocated with some effort.  This construct was "two fingers tight."  Next, the trial components were removed on  the humeral side and glenoid side.  IrriSept solution used to irrigate the humeral canal and vancomycin powder placed.  Six suture tapes were placed into the lesser tuberosity region for later subscap repair.  Next, the true glenosphere was placed with  the offset posterior and inferior.  Next, true stem was placed with excellent press fit obtained.  A trial reduction with the same implants, +6 tray and standard poly was then performed with good stable reduction and good restoration of offset.  Next,  the true component was placed in terms of the humeral tray and liner.  This gave very good same stability.  A thorough irrigation was performed with 5 liters of irrigating solution.  IrriSept solution utilized and then vancomycin powder placed after  closure of the subscap with the suture tapes in about 30 degrees of external rotation.  After subscap closure the arm externally rotated to about 50 degrees.  Vancomycin powder was then placed onto the prosthetic joint interface and then the rotator  interval was closed using #1 Vicryl suture with the arm in 30 degrees of external rotation.  Deltopectoral incision was then closed using #1 Vicryl suture followed by interrupted inverted 0 Vicryl suture, 2-0 Vicryl suture, and 3-0 Monocryl with  Steri-Strips and Aquacel dressing applied.  Luke's assistance was required at all times during the case for opening, closing, retraction of the humeral head, retraction of the deltoid, retraction of the glenoid, drilling, suture  retrieval for the subscap  repair.  Overall, limb positioning and soft tissue management as well as closing.  His assistance was required at all times during the case.   PUS D: 01/24/2022 1:31:54 pm T: 01/24/2022 3:36:00 pm  JOB: [id number]/ [id number]

## 2022-01-24 NOTE — Anesthesia Procedure Notes (Signed)
Procedure Name: Intubation ?Date/Time: 01/24/2022 7:43 AM ?Performed by: Carolan Clines, CRNA ?Pre-anesthesia Checklist: Patient identified, Emergency Drugs available, Suction available and Patient being monitored ?Patient Re-evaluated:Patient Re-evaluated prior to induction ?Oxygen Delivery Method: Circle System Utilized ?Preoxygenation: Pre-oxygenation with 100% oxygen ?Induction Type: IV induction ?Ventilation: Mask ventilation without difficulty ?Laryngoscope Size: Mac and 4 ?Grade View: Grade I ?Tube type: Oral ?Tube size: 7.5 mm ?Number of attempts: 1 ?Airway Equipment and Method: Stylet and Oral airway ?Placement Confirmation: ETT inserted through vocal cords under direct vision, positive ETCO2 and breath sounds checked- equal and bilateral ?Secured at: 23 cm ?Tube secured with: Tape ?Dental Injury: Teeth and Oropharynx as per pre-operative assessment  ? ? ? ? ?

## 2022-01-24 NOTE — Anesthesia Procedure Notes (Signed)
Anesthesia Regional Block: Interscalene brachial plexus block  ? ?Pre-Anesthetic Checklist: , timeout performed,  Correct Patient, Correct Site, Correct Laterality,  Correct Procedure, Correct Position, site marked,  Risks and benefits discussed,  Pre-op evaluation,  At surgeon's request and post-op pain management ? ?Laterality: Right ? ?Prep: Maximum Sterile Barrier Precautions used, chloraprep     ?  ?Needles:  ?Injection technique: Single-shot ? ?Needle Type: Echogenic Stimulator Needle   ? ? ?Needle Length: 9cm  ?Needle Gauge: 21  ? ? ? ?Additional Needles: ? ? ?Procedures:,,,, ultrasound used (permanent image in chart),,    ?Narrative:  ?Start time: 01/24/2022 7:03 AM ?End time: 01/24/2022 7:13 AM ?Injection made incrementally with aspirations every 5 mL. ? ?Performed by: Personally  ?Anesthesiologist: Roderic Palau, MD ? ?Additional Notes: ?Intercostobrachial block with 10cc of 0.5% Ropivicaine after sterile prep and drape. ? ? ? ? ?

## 2022-01-24 NOTE — Anesthesia Postprocedure Evaluation (Signed)
Anesthesia Post Note ? ?Patient: Jason Norton ? ?Procedure(s) Performed: RIGHT REVERSE SHOULDER ARTHROPLASTY (Right: Shoulder) ? ?  ? ?Patient location during evaluation: PACU ?Anesthesia Type: General and Regional ?Level of consciousness: awake and alert ?Pain management: pain level controlled ?Vital Signs Assessment: post-procedure vital signs reviewed and stable ?Respiratory status: spontaneous breathing, nonlabored ventilation and respiratory function stable ?Cardiovascular status: blood pressure returned to baseline and stable ?Postop Assessment: no apparent nausea or vomiting ?Anesthetic complications: no ? ? ?No notable events documented. ? ?Last Vitals:  ?Vitals:  ? 01/24/22 1210 01/24/22 1236  ?BP: 103/68 112/72  ?Pulse: 81 94  ?Resp: 13 18  ?Temp: 36.9 ?C 36.7 ?C  ?SpO2: 93% 93%  ?  ?Last Pain:  ?Vitals:  ? 01/24/22 1236  ?TempSrc: Oral  ?PainSc:   ? ? ?  ?  ?  ?  ?  ?  ? ?Yobani Schertzer,Jason Norton ? ? ? ? ?

## 2022-01-25 ENCOUNTER — Other Ambulatory Visit (HOSPITAL_COMMUNITY): Payer: Self-pay

## 2022-01-25 ENCOUNTER — Encounter: Payer: Self-pay | Admitting: Orthopedic Surgery

## 2022-01-25 ENCOUNTER — Encounter (HOSPITAL_COMMUNITY): Payer: Self-pay | Admitting: Orthopedic Surgery

## 2022-01-25 ENCOUNTER — Observation Stay (HOSPITAL_BASED_OUTPATIENT_CLINIC_OR_DEPARTMENT_OTHER): Payer: 59

## 2022-01-25 DIAGNOSIS — Z96651 Presence of right artificial knee joint: Secondary | ICD-10-CM | POA: Diagnosis not present

## 2022-01-25 DIAGNOSIS — Z7901 Long term (current) use of anticoagulants: Secondary | ICD-10-CM | POA: Diagnosis not present

## 2022-01-25 DIAGNOSIS — E039 Hypothyroidism, unspecified: Secondary | ICD-10-CM | POA: Diagnosis not present

## 2022-01-25 DIAGNOSIS — M19011 Primary osteoarthritis, right shoulder: Secondary | ICD-10-CM | POA: Diagnosis not present

## 2022-01-25 DIAGNOSIS — Z87891 Personal history of nicotine dependence: Secondary | ICD-10-CM | POA: Diagnosis not present

## 2022-01-25 DIAGNOSIS — Z96642 Presence of left artificial hip joint: Secondary | ICD-10-CM | POA: Diagnosis not present

## 2022-01-25 DIAGNOSIS — Z79899 Other long term (current) drug therapy: Secondary | ICD-10-CM | POA: Diagnosis not present

## 2022-01-25 DIAGNOSIS — M7989 Other specified soft tissue disorders: Secondary | ICD-10-CM

## 2022-01-25 DIAGNOSIS — Z86711 Personal history of pulmonary embolism: Secondary | ICD-10-CM | POA: Diagnosis not present

## 2022-01-25 LAB — CBC
HCT: 43.2 % (ref 39.0–52.0)
Hemoglobin: 14.1 g/dL (ref 13.0–17.0)
MCH: 29.2 pg (ref 26.0–34.0)
MCHC: 32.6 g/dL (ref 30.0–36.0)
MCV: 89.4 fL (ref 80.0–100.0)
Platelets: 191 10*3/uL (ref 150–400)
RBC: 4.83 MIL/uL (ref 4.22–5.81)
RDW: 15 % (ref 11.5–15.5)
WBC: 15.4 10*3/uL — ABNORMAL HIGH (ref 4.0–10.5)
nRBC: 0 % (ref 0.0–0.2)

## 2022-01-25 MED ORDER — OXYCODONE HCL 5 MG PO TABS
5.0000 mg | ORAL_TABLET | ORAL | 0 refills | Status: DC | PRN
  Filled 2022-01-25: qty 30, 3d supply, fill #0

## 2022-01-25 MED ORDER — GABAPENTIN 300 MG PO CAPS
300.0000 mg | ORAL_CAPSULE | Freq: Three times a day (TID) | ORAL | 0 refills | Status: DC
  Filled 2022-01-25: qty 30, 10d supply, fill #0

## 2022-01-25 NOTE — Telephone Encounter (Signed)
This message was sent prior to his discharge from the hospital today.  Should be addressed after I sent the medications in after his discharge.

## 2022-01-25 NOTE — Evaluation (Signed)
Occupational Therapy Evaluation ?Patient Details ?Name: Jason Norton ?MRN: [id number] ?DOB: April 13, 1956 ?Today's Date: 01/25/2022 ? ? ?History of Present Illness Pt is a 66 y/o male presenting on 5/16 s/p Right reverse shoulder arthroplasty. PMH includes: carpal tunnel, DJD, gout, PE, umbilical hernia, prior back surgery, R TKA, L THA.  ? ?Clinical Impression ?  ?PTA patient independent.  Admitted for above and presents with R shoulder surgery deficits including pain, precautions and decreased functional use. Educated on shoulder precautions (ROM and NWB), sling management and wear schedule, exercises, ADL compensatory techniques and safety.  Patient requires up to min assist for ADLs and spouse plans to assist as needed.  He completes mobility and transfers with independence.  Good understanding of exercises, but patient voiced preference to not practicing pendulums at this time.  No further OT needs identified and OT will sign off. Thank you for this referral.  ?   ? ?Recommendations for follow up therapy are one component of a multi-disciplinary discharge planning process, led by the attending physician.  Recommendations may be updated based on patient status, additional functional criteria and insurance authorization.  ? ?Follow Up Recommendations ? Follow physician's recommendations for discharge plan and follow up therapies  ?  ?Assistance Recommended at Discharge Intermittent Supervision/Assistance  ?Patient can return home with the following A little help with bathing/dressing/bathroom;Assistance with cooking/housework ? ?  ?Functional Status Assessment ?    ?Equipment Recommendations ? None recommended by OT  ?  ?Recommendations for Other Services   ? ? ?  ?Precautions / Restrictions Precautions ?Precautions: Shoulder ?Type of Shoulder Precautions: no AROM at shoulder, OK Elbow/wrist/hand, pendulums ?Shoulder Interventions: Shoulder sling/immobilizer;At all times;Off for  dressing/bathing/exercises ?Precaution Booklet Issued: Yes (comment) ?Precaution Comments: reviewed with pt ?Required Braces or Orthoses: Sling ?Restrictions ?Weight Bearing Restrictions: No ?RUE Weight Bearing: Non weight bearing  ? ?  ? ?Mobility Bed Mobility ?Overal bed mobility: Modified Independent ?  ?  ?  ?  ?  ?  ?  ?  ? ?Transfers ?  ?  ?  ?  ?  ?  ?  ?  ?  ?  ?  ? ?  ?Balance Overall balance assessment: Mild deficits observed, not formally tested ?  ?  ?  ?  ?  ?  ?  ?  ?  ?  ?  ?  ?  ?  ?  ?  ?  ?  ?   ? ?ADL either performed or assessed with clinical judgement  ? ?ADL Overall ADL's : Needs assistance/impaired ?  ?  ?Grooming: Minimal assistance;Sitting ?  ?Upper Body Bathing: Minimal assistance;Sitting ?  ?Lower Body Bathing: Sit to/from stand;Minimal assistance ?  ?Upper Body Dressing : Minimal assistance;Sitting ?  ?Lower Body Dressing: Minimal assistance;Sit to/from stand ?  ?Toilet Transfer: Modified Independent;Ambulation ?  ?Toileting- Clothing Manipulation and Hygiene: Supervision/safety;Sit to/from stand ?  ?  ?  ?Functional mobility during ADLs: Modified independent ?General ADL Comments: pt limited by R shoulder surgery, requires up to min assist for ADLs and spouse plans to assist  ? ? ? ?Vision   ?Vision Assessment?: No apparent visual deficits  ?   ?Perception   ?  ?Praxis   ?  ? ?Pertinent Vitals/Pain Pain Assessment ?Pain Assessment: Faces ?Faces Pain Scale: Hurts a little bit ?Pain Location: R shoulder, incisional ?Pain Descriptors / Indicators: Discomfort, Operative site guarding ?Pain Intervention(s): Limited activity within patient's tolerance, Monitored during session, Repositioned  ? ? ? ?Hand Dominance Right ?  ?  Extremity/Trunk Assessment Upper Extremity Assessment ?Upper Extremity Assessment: RUE deficits/detail ?RUE Deficits / Details: Limited due to surgery at shoulder, WFL elbow and distal.  Block still intact- decreased sensation in thumb ?RUE: Unable to fully assess due to  immobilization ?RUE Sensation: WNL ?RUE Coordination: decreased gross motor ?  ?Lower Extremity Assessment ?Lower Extremity Assessment: Overall WFL for tasks assessed ?  ?  ?  ?Communication Communication ?Communication: No difficulties ?  ?Cognition Arousal/Alertness: Awake/alert ?Behavior During Therapy: [facility] for tasks assessed/performed ?Overall Cognitive Status: Within Functional Limits for tasks assessed ?  ?  ?  ?  ?  ?  ?  ?  ?  ?  ?  ?  ?  ?  ?  ?  ?  ?  ?  ?General Comments    ? ?  ?Exercises Exercises: Shoulder, Hand exercises ?Shoulder Exercises ?Elbow Flexion: AROM, 10 reps, Right, Supine ?Elbow Extension: AROM, Right, 10 reps, Supine ?Wrist Flexion: AROM, Right, 10 reps, Supine ?Wrist Extension: AROM, Right, 10 reps, Supine ?Digit Composite Flexion: AROM, Right, 10 reps, Supine ?Composite Extension: AROM, Right, 10 reps, Supine ?Neck Lateral Flexion - Right: AROM, 5 reps ?Neck Lateral Flexion - Left: AROM, 5 reps ?Hand Exercises ?Forearm Supination: AROM, Right, 10 reps, Supine ?Forearm Pronation: AROM, Right, 10 reps, Supine ?  ?Shoulder Instructions Shoulder Instructions ?Donning/doffing shirt without moving shoulder: Minimal assistance ?Method for sponge bathing under operated UE: Minimal assistance ?Donning/doffing sling/immobilizer: Caregiver independent with task;Supervision/safety ?Correct positioning of sling/immobilizer: Supervision/safety ?Pendulum exercises (written home exercise program):  (provided handout, pt preference to not practice today) ?ROM for elbow, wrist and digits of operated UE: Min-guard ?Sling wearing schedule (on at all times/off for ADL's): Modified independent ?Proper positioning of operated UE when showering: Supervision/safety ?Positioning of UE while sleeping: Modified independent  ? ? ?Home Living Family/patient expects to be discharged to:: Private residence ?Living Arrangements: Spouse/significant other ?Available Help at Discharge: Family ?Type of Home: House ?Home  Access: Stairs to enter ?Entrance Stairs-Number of Steps: 1 ?  ?Home Layout: One level ?  ?  ?Bathroom Shower/Tub: Walk-in shower ?  ?Bathroom Toilet: Standard ?  ?  ?Home Equipment: None ?  ?  ?  ? ?  ?Prior Functioning/Environment Prior Level of Function : Independent/Modified Independent ?  ?  ?  ?  ?  ?  ?  ?  ?  ? ?  ?  ?OT Problem List: Decreased activity tolerance;Decreased coordination;Decreased knowledge of use of DME or AE;Decreased knowledge of precautions;Pain;Impaired UE functional use ?  ?   ?OT Treatment/Interventions:    ?  ?OT Goals(Current goals can be found in the care plan section) Acute Rehab OT Goals ?Patient Stated Goal: home ?OT Goal Formulation: With patient ?Time For Goal Achievement: 02/08/22 ?Potential to Achieve Goals: Good  ?OT Frequency:   ?  ? ?Co-evaluation   ?  ?  ?  ?  ? ?  ?AM-PAC OT "6 Clicks" Daily Activity     ?Outcome Measure Help from another person eating meals?: A Little ?Help from another person taking care of personal grooming?: A Little ?Help from another person toileting, which includes using toliet, bedpan, or urinal?: A Little ?Help from another person bathing (including washing, rinsing, drying)?: A Little ?Help from another person to put on and taking off regular upper body clothing?: A Little ?Help from another person to put on and taking off regular lower body clothing?: A Little ?6 Click Score: 18 ?  ?End of Session Equipment Utilized During Treatment: Other (  comment) (sling) ?Nurse Communication: Mobility status ? ?Activity Tolerance: Patient tolerated treatment well ?Patient left: with family/visitor present (walking hallway) ? ?OT Visit Diagnosis: Pain ?Pain - Right/Left: Right ?Pain - part of body: Shoulder  ?              ?Time: 2094-7096 ?OT Time Calculation (min): 29 min ?Charges:  OT General Charges ?$OT Visit: 1 Visit ?OT Evaluation ?$OT Eval Low Complexity: 1 Low ?OT Treatments ?$Self Care/Home Management : 8-22 mins ? ?Jolaine Artist, OT ?Acute  Rehabilitation Services ?Pager [phone] ?Office [phone] ? ? ?Delight Stare ?01/25/2022, 9:54 AM ?

## 2022-01-25 NOTE — Plan of Care (Signed)
Pt and wife given D/C instructions with verbal understanding. Rx's were sent to the pharmacy by MD. Pt's incision is clean and dry with no sign of infection. Pt's IV was removed prior to D/C. Pt D/C'd home via wheelchair per MD order. Pt is stable @ D/C and has no other needs at this time. Ruqaya Strauss, RN  

## 2022-01-25 NOTE — Progress Notes (Signed)
PT Cancellation Note ? ?Patient Details ?Name: Jason Norton ?MRN: [id number] ?DOB: [birth year] ? ? ?Cancelled Treatment:    Reason Eval/Treat Not Completed: PT screened, no needs identified, will sign off Per OT, patient independent with mobility with no PT needs. PT will sign off.  ? ?Anu Stagner A. Gilford Rile, PT, DPT ?Acute Rehabilitation Services ?Pager [phone] ?Office [phone] ? ? ? ?Jason Norton ?01/25/2022, 9:43 AM ?

## 2022-01-25 NOTE — Progress Notes (Signed)
?  Subjective: ?Jason Norton is a 66 y.o. male s/p right RSA.  They are POD 1.  Pt's pain is controlled.  Patient denies any significant ongoing chest pain, shortness of breath, abdominal pain.  He has been ambulatory up and down the hall since the procedure without any lightheadedness or dizziness.  Pain is well controlled with block still in effect to some degree but is wearing off.  Did have increased pain last night but it is back under control..   ? ?Objective: ?Vital signs in last 24 hours: ?Temp:  [97.7 ?F (36.5 ?C)-98.4 ?F (36.9 ?C)] 98.4 ?F (36.9 ?C) (05/17 0522) ?Pulse Rate:  [79-94] 79 (05/17 0522) ?Resp:  [12-18] 18 (05/17 0522) ?BP: (97-117)/(68-88) 108/73 (05/17 0522) ?SpO2:  [90 %-97 %] 94 % (05/17 0522) ? ?Intake/Output from previous day: ?05/16 0701 - 05/17 0700 ?In: 1100 [I.V.:800; IV Piggyback:300] ?Out: 100 [Blood:100] ?Intake/Output this shift: ?No intake/output data recorded. ? ?Exam: ? ?No gross blood or drainage overlying the dressing ?2+ radial pulse ?Intact EPL, finger abduction, wrist extension, pronation/supination, bicep, tricep, deltoid.  Axillary nerve intact with deltoid firing. ? ? ?Labs: ?Recent Labs  ?  01/25/22 ?0659  ?HGB 14.1  ? ?Recent Labs  ?  01/25/22 ?0659  ?WBC 15.4*  ?RBC 4.83  ?HCT 43.2  ?PLT 191  ? ?No results for input(s): NA, K, CL, CO2, BUN, CREATININE, GLUCOSE, CALCIUM in the last 72 hours. ?No results for input(s): LABPT, INR in the last 72 hours. ? ?Assessment/Plan: ?Pt is POD 1 s/p right RSA   ? -Plan to discharge to home today after ultrasound ? -No lifting with the operative arm ? -Bilateral lower extremity ultrasound ordered to ensure patient does not have DVT due to his history of DVT/PE.  He did resume his Eliquis last night. ?  ? ? ?Jason Norton ?01/25/2022, 8:26 AM  ? ? ?   ?

## 2022-01-27 ENCOUNTER — Other Ambulatory Visit: Payer: Self-pay | Admitting: Surgical

## 2022-01-27 ENCOUNTER — Other Ambulatory Visit (HOSPITAL_COMMUNITY): Payer: Self-pay

## 2022-01-27 ENCOUNTER — Other Ambulatory Visit: Payer: Self-pay | Admitting: Orthopedic Surgery

## 2022-01-27 MED FILL — Oxycodone HCl Tab 5 MG: ORAL | 3 days supply | Qty: 30 | Fill #0 | Status: AC

## 2022-01-27 NOTE — Telephone Encounter (Signed)
Jason Norton is calling to f/u up on the medication refill request for her husband. They only use the [facility] so they are trying to get the prescription filled before closing today. Please advise

## 2022-01-29 ENCOUNTER — Encounter (HOSPITAL_COMMUNITY): Payer: Self-pay | Admitting: Orthopedic Surgery

## 2022-02-02 NOTE — Discharge Summary (Signed)
Physician Discharge Summary      Patient ID: Jason Norton MRN: [id number] DOB/AGE: [birth year] 66 y.o.  Admit date: 01/24/2022 Discharge date: 01/25/2022  Admission Diagnoses:  Principal Problem:   S/P reverse total shoulder arthroplasty, right   Discharge Diagnoses:  Same  Surgeries: Procedure(s): RIGHT REVERSE SHOULDER ARTHROPLASTY on 01/24/2022   Consultants:   Discharged Condition: Stable  Hospital Course: Jason Norton is an 66 y.o. 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  They were brought to the operating room on 01/24/2022 and underwent the above named procedures.  Pt awoke from anesthesia without complication and was transferred to the floor. On POD1, patient's pain was controlled.  Block was still in effect.  He had bilateral lower extremity ultrasounds to rule out DVT with his history of PE/DVT and these were found to be negative.  He was discharged home on POD 1..  Pt will f/u with Dr. Marlou Sa in clinic in ~2 weeks.   Antibiotics given:  Anti-infectives (From admission, onward)    Start     Dose/Rate Route Frequency Ordered Stop   01/24/22 1600  ceFAZolin (ANCEF) IVPB 2g/100 mL premix        2 g 200 mL/hr over 30 Minutes Intravenous Every 8 hours 01/24/22 1238 01/25/22 0543   01/24/22 0953  vancomycin (VANCOCIN) powder  Status:  Discontinued          As needed 01/24/22 0953 01/24/22 1121   01/24/22 0606  ceFAZolin (ANCEF) 2-4 GM/100ML-% IVPB       Note to Pharmacy: Gleason, Ginger E: cabinet override      01/24/22 0606 01/24/22 0758   01/24/22 0600  ceFAZolin (ANCEF) IVPB 2g/100 mL premix        2 g 200 mL/hr over 30 Minutes Intravenous On call to O.R. 01/24/22 8099 01/24/22 0820     .  Recent vital signs:  Vitals:   01/25/22 0522 01/25/22 0909  BP: 108/73 117/77  Pulse: 79 89  Resp: 18 17  Temp: 98.4 F (36.9 C) 98 F (36.7 C)  SpO2: 94% 97%     Recent laboratory studies:  Results for orders placed or performed during the hospital encounter of 01/24/22  CBC  Result Value Ref Range   WBC 15.4 (H) 4.0 - 10.5 K/uL   RBC 4.83 4.22 - 5.81 MIL/uL   Hemoglobin 14.1 13.0 - 17.0 g/dL   HCT 43.2 39.0 - 52.0 %   MCV 89.4 80.0 - 100.0 fL   MCH 29.2 26.0 - 34.0 pg   MCHC 32.6 30.0 - 36.0 g/dL   RDW 15.0 11.5 - 15.5 %   Platelets 191 150 - 400 K/uL   nRBC 0.0 0.0 - 0.2 %    Discharge Medications:   Allergies as of 01/25/2022       Reactions   Sulfa Antibiotics Other (See Comments)   Dry skin, cracking skin        Medication List     TAKE these medications    allopurinol 300 MG tablet Commonly known as: ZYLOPRIM Take 1 tablet (300 mg total) by mouth daily. What changed: when to take this   cholecalciferol 25 MCG (1000 UNIT) tablet Commonly known as: VITAMIN D3 Take 1,000 Units by mouth in the morning.   colchicine 0.6 MG tablet Take 0.6 mg by mouth daily as needed (gout).   doxycycline 100 MG tablet Commonly known as:  VIBRA-TABS Take 100 mg by mouth 2 (two) times daily.   Eliquis 5 MG Tabs tablet Generic drug: apixaban Take 1 tablet (5 mg total) by mouth 2 (two) times daily.   finasteride 5 MG tablet Commonly known as: PROSCAR Take 1 tablet (5 mg total) by mouth daily. What changed:  how much to take when to take this   gabapentin 300 MG capsule Commonly known as: Neurontin Take 1 capsule (300 mg total) by mouth 3 (three) times daily.   methocarbamol 500 MG tablet Commonly known as: ROBAXIN Take 1 tablet (500 mg total) by mouth 2 (two) times daily as needed for muscle spasms What changed:  how much to take when to take this reasons to take this   rosuvastatin 5 MG tablet Commonly known as: CRESTOR Take 1 tablet (5 mg total) by mouth daily. What changed: when to take this   Synthroid 100 MCG tablet Generic drug: levothyroxine Take 1 tablet (100 mcg total) by mouth in the morning on an empty  stomach, separate by 30 minutes from food or calcium products. What changed: when to take this   vitamin B-12 1000 MCG tablet Commonly known as: CYANOCOBALAMIN Take 1,000 mcg by mouth in the morning.   vitamin C 1000 MG tablet Take 1,000 mg by mouth in the morning.   vitamin E 180 MG (400 UNITS) capsule Take 400 Units by mouth in the morning.   Wegovy 0.25 MG/0.5ML Soaj Generic drug: Semaglutide-Weight Management Inject 0.25 mg into the skin once a week.   Wegovy 0.5 MG/0.5ML Soaj Generic drug: Semaglutide-Weight Management Inject 0.5 mg into the skin once a week.   Wegovy 1 MG/0.5ML Soaj Generic drug: Semaglutide-Weight Management Inject 1 mg into the skin once a week.        Diagnostic Studies: DG Shoulder Right Port  Result Date: 01/24/2022 CLINICAL DATA:  Right shoulder surgery EXAM: RIGHT SHOULDER - 1 VIEW COMPARISON:  11/30/2021 FINDINGS: Interval postsurgical changes from reverse right shoulder arthroplasty. Arthroplasty components are in their expected alignment. No periprosthetic fracture or evidence of other complication. Expected postoperative changes within the overlying soft tissues. IMPRESSION: Interval postsurgical changes from reverse right shoulder arthroplasty without apparent complication. Electronically Signed   By: Davina Poke D.O.   On: 01/24/2022 12:07   VAS [place] LOWER EXTREMITY VENOUS (DVT)  Result Date: 01/25/2022  Lower Venous DVT Study Patient Name:  Jason Norton  Date of Exam:   01/25/2022 Medical Rec #: [id number]          Accession #:    [id number] Date of Birth: Aug 27, 1956          Patient Gender: M Patient Age:   22 years Exam Location:  [facility] Procedure:      VAS [place] LOWER EXTREMITY VENOUS (DVT) Referring Phys: Gloriann Loan --------------------------------------------------------------------------------  Indications: Swelling.  Risk Factors: History of PE years ago Surgery S/P right shoulder surgery on 01/24/22. Performing  Technologist: Oda Cogan RDMS, RVT  Examination Guidelines: A complete evaluation includes B-mode imaging, spectral Doppler, color Doppler, and power Doppler as needed of all accessible portions of each vessel. Bilateral testing is considered an integral part of a complete examination. Limited examinations for reoccurring indications may be performed as noted. The reflux portion of the exam is performed with the patient in reverse Trendelenburg.  +---------+---------------+---------+-----------+----------+--------------+ RIGHT    CompressibilityPhasicitySpontaneityPropertiesThrombus Aging +---------+---------------+---------+-----------+----------+--------------+ CFV      Full           Yes  Yes                                 +---------+---------------+---------+-----------+----------+--------------+ SFJ      Full                                                        +---------+---------------+---------+-----------+----------+--------------+ FV Prox  Full                                                        +---------+---------------+---------+-----------+----------+--------------+ FV Mid   Full                                                        +---------+---------------+---------+-----------+----------+--------------+ FV DistalFull                                                        +---------+---------------+---------+-----------+----------+--------------+ PFV      Full                                                        +---------+---------------+---------+-----------+----------+--------------+ POP      Full           Yes      Yes                                 +---------+---------------+---------+-----------+----------+--------------+ PTV      Full                                                        +---------+---------------+---------+-----------+----------+--------------+ PERO     Full                                                         +---------+---------------+---------+-----------+----------+--------------+   +---------+---------------+---------+-----------+----------+--------------+ LEFT     CompressibilityPhasicitySpontaneityPropertiesThrombus Aging +---------+---------------+---------+-----------+----------+--------------+ CFV      Full           Yes      Yes                                 +---------+---------------+---------+-----------+----------+--------------+ SFJ      Full                                                        +---------+---------------+---------+-----------+----------+--------------+  FV Prox  Full                                                        +---------+---------------+---------+-----------+----------+--------------+ FV Mid   Full                                                        +---------+---------------+---------+-----------+----------+--------------+ FV DistalFull                                                        +---------+---------------+---------+-----------+----------+--------------+ PFV      Full                                                        +---------+---------------+---------+-----------+----------+--------------+ POP      Full           Yes      Yes                                 +---------+---------------+---------+-----------+----------+--------------+ PTV      Full                                                        +---------+---------------+---------+-----------+----------+--------------+ PERO     Full                                                        +---------+---------------+---------+-----------+----------+--------------+     Summary: BILATERAL: - No evidence of deep vein thrombosis seen in the lower extremities, bilaterally. -No evidence of popliteal cyst, bilaterally.   *See table(s) above for measurements and observations. Electronically signed by Jamelle Haring on  01/25/2022 at 9:17:52 PM.    Final     Disposition: Discharge disposition: 01-Home or Self Care       Discharge Instructions     Call MD / Call 911   Complete by: As directed    If you experience chest pain or shortness of breath, CALL 911 and be transported to the hospital emergency room.  If you develope a fever above 101 F, pus (white drainage) or increased drainage or redness at the wound, or calf pain, call your surgeon's office.   Constipation Prevention   Complete by: As directed    Drink plenty of fluids.  Prune juice may be helpful.  You may use a stool softener, such as Colace (over the counter) 100 mg twice a day.  Use MiraLax (  over the counter) for constipation as needed.   Diet - low sodium heart healthy   Complete by: As directed    Discharge instructions   Complete by: As directed    You may shower, dressing is waterproof.  Do not bathe or soak the operative shoulder in a tub, pool.  Use the CPM machine 3 times a day for one hour each time, increasing the degrees of range of motion daily.  No lifting with the operative shoulder. Continue use of the sling.  Follow-up with Dr. Marlou Sa in ~2 weeks on your given appointment date.  We will remove your adhesive bandage at that time.    Dental Antibiotics:  In most cases prophylactic antibiotics for Dental procdeures after total joint surgery are not necessary.  Exceptions are as follows:  1. History of prior total joint infection  2. Severely immunocompromised (Organ Transplant, cancer chemotherapy, Rheumatoid biologic meds such as [place])  3. Poorly controlled diabetes (A1C &gt; 8.0, blood glucose over 200)  If you have one of these conditions, contact your surgeon for an antibiotic prescription, prior to your dental procedure.   Increase activity slowly as tolerated   Complete by: As directed    Post-operative opioid taper instructions:   Complete by: As directed    POST-OPERATIVE OPIOID TAPER INSTRUCTIONS: It is  important to wean off of your opioid medication as soon as possible. If you do not need pain medication after your surgery it is ok to stop day one. Opioids include: Codeine, Hydrocodone(Norco, Vicodin), Oxycodone(Percocet, oxycontin) and hydromorphone amongst others.  Long term and even short term use of opiods can cause: Increased pain response Dependence Constipation Depression Respiratory depression And more.  Withdrawal symptoms can include Flu like symptoms Nausea, vomiting And more Techniques to manage these symptoms Hydrate well Eat regular healthy meals Stay active Use relaxation techniques(deep breathing, meditating, yoga) Do Not substitute Alcohol to help with tapering If you have been on opioids for less than two weeks and do not have pain than it is ok to stop all together.  Plan to wean off of opioids This plan should start within one week post op of your joint replacement. Maintain the same interval or time between taking each dose and first decrease the dose.  Cut the total daily intake of opioids by one tablet each day Next start to increase the time between doses. The last dose that should be eliminated is the evening dose.             SignedDonella Stade 02/02/2022, 7:33 AM

## 2022-02-03 DIAGNOSIS — M19011 Primary osteoarthritis, right shoulder: Secondary | ICD-10-CM

## 2022-02-08 ENCOUNTER — Other Ambulatory Visit (HOSPITAL_COMMUNITY): Payer: Self-pay

## 2022-02-08 ENCOUNTER — Ambulatory Visit (INDEPENDENT_AMBULATORY_CARE_PROVIDER_SITE_OTHER): Payer: 59 | Admitting: Orthopedic Surgery

## 2022-02-08 ENCOUNTER — Ambulatory Visit (INDEPENDENT_AMBULATORY_CARE_PROVIDER_SITE_OTHER): Payer: 59

## 2022-02-08 ENCOUNTER — Encounter: Payer: Self-pay | Admitting: Orthopedic Surgery

## 2022-02-08 DIAGNOSIS — Z96611 Presence of right artificial shoulder joint: Secondary | ICD-10-CM | POA: Diagnosis not present

## 2022-02-08 MED ORDER — OXYCODONE HCL 5 MG PO TABS
5.0000 mg | ORAL_TABLET | Freq: Three times a day (TID) | ORAL | 0 refills | Status: AC | PRN
  Filled 2022-02-08: qty 30, 10d supply, fill #0

## 2022-02-08 MED ORDER — WEGOVY 1.7 MG/0.75ML ~~LOC~~ SOAJ
1.7000 mg | SUBCUTANEOUS | 3 refills | Status: DC
Start: 2022-02-08 — End: 2022-06-26
  Filled 2022-02-08: qty 3, 28d supply, fill #0

## 2022-02-08 NOTE — Progress Notes (Signed)
Post-Op Visit Note   Patient: Jason Norton           Date of Birth: March 21, 1956           MRN: [id number] Visit Date: 02/08/2022 PCP: Haywood Pao, MD   Assessment & Plan:  Chief Complaint:  Chief Complaint  Patient presents with   Right Shoulder - Pain   Visit Diagnoses:  1. S/P reverse total shoulder arthroplasty, right     Plan: Patient is a 66 year old male who presents s/p reverse total shoulder arthroplasty of the right shoulder on 01/24/2022.  He initially had a fair amount of moderate to severe pain after the block wore off when he went home after surgery but pain has significantly improved to the point where he just describes it as soreness.  He has been able to sleep in bed without much difficulty.  He has been compliant with wearing the sling and not lifting anything.  No chest pain, shortness of breath, abdominal pain.  No fevers or chills.  He has been using the CPM machine for 3 to 4 hours/day at 90 degrees.  On exam, patient has 25 degrees external rotation, 70 degrees abduction, 90 degrees forward flexion.  Incision looks to be healing well without evidence of infection or dehiscence.  Intact EPL, FPL, finger abduction, finger adduction, wrist extension, pronation/supination, bicep, tricep, deltoid.  Axillary nerve is intact with deltoid firing.  Plan is to continue with CPM machine.  He will be set up for physical therapy at [facility] well.  Focus on passive and active range of motion.  No lifting more than 5 pounds.  Okay to discontinue sling.  Follow-up in 4 weeks for clinical recheck.  Follow-Up Instructions: No follow-ups on file.   Orders:  Orders Placed This Encounter  Procedures   XR Shoulder Right   Meds ordered this encounter  Medications   oxyCODONE (OXY IR/ROXICODONE) 5 MG immediate release tablet    Sig: Take 1 tablet (5 mg total) by mouth every 8 (eight) hours as needed for moderate pain (pain score 4-6).    Dispense:  30 tablet    Refill:  0     Imaging: No results found.  PMFS History: Patient Active Problem List   Diagnosis Date Noted   Arthritis of right shoulder region    S/P reverse total shoulder arthroplasty, right 44/81/8563   Umbilical hernia 14/97/0263   Hematoma, postoperative 09/07/2012   Chronic anticoagulation 09/07/2012   GOUT 10/25/2007   History of pulmonary embolism 10/25/2007   DIARRHEA 10/25/2007   PERSONAL HISTORY OF ARTHRITIS 10/25/2007   Past Medical History:  Diagnosis Date   Carpal tunnel syndrome    Clotting disorder (HCC)    Diverticulosis    DJD (degenerative joint disease)    Gout    Hyperlipemia    Hypothyroidism    Meningitis    at age 53 weeks old   PE (pulmonary embolism) 09/12/2003   hx post freq [id number]   Umbilical hernia     Family History  Adopted: Yes    Past Surgical History:  Procedure Laterality Date   BACK SURGERY     lumb lam   CARPAL TUNNEL RELEASE  08/27/2012   Procedure: CARPAL TUNNEL RELEASE;  Surgeon: Roseanne Kaufman, MD;  Location: [place];  Service: Orthopedics;  Laterality: Right;  Right Limited Open Carpal Tunnel Release    COLONOSCOPY     I & D EXTREMITY  09/07/2012   Procedure: IRRIGATION AND  DEBRIDEMENT EXTREMITY;  Surgeon: Linna Hoff, MD;  Location: [place];  Service: Orthopedics;  Laterality: Right;   INSERTION OF MESH N/A 05/12/2015   Procedure: INSERTION OF MESH;  Surgeon: Ralene Ok, MD;  Location: [place];  Service: General;  Laterality: N/A;   KNEE ARTHROPLASTY Right 09/11/1973   right knee reconstruction hign [id number]   KNEE ARTHROSCOPY Right    x3   REVERSE SHOULDER ARTHROPLASTY Right 01/24/2022   Procedure: RIGHT REVERSE SHOULDER ARTHROPLASTY;  Surgeon: Meredith Pel, MD;  Location: [place];  Service: Orthopedics;  Laterality: Right;   STERIOD INJECTION  08/27/2012   Procedure: STEROID INJECTION;  Surgeon: Roseanne Kaufman, MD;  Location: [place];  Service: Orthopedics;  Laterality: Left;    TOTAL HIP ARTHROPLASTY  09/11/2010   left   TOTAL KNEE ARTHROPLASTY  09/11/2005   rt   TOTAL KNEE REVISION  43/88/8757   rt   UMBILICAL HERNIA REPAIR N/A 05/12/2015   Procedure: LAPAROSCOPIC UMBILICAL HERNIA REPAIR;  Surgeon: Ralene Ok, MD;  Location: [place];  Service: General;  Laterality: N/A;   WRIST ARTHROSCOPY  09/11/2004   debrid-rt   Social History   Occupational History   Not on file  Tobacco Use   Smoking status: Former    Types: Pipe   Smokeless tobacco: Never   Tobacco comments:    " quit a couple of years ago"( 05/11/15)  Vaping Use   Vaping Use: Never used  Substance and Sexual Activity   Alcohol use: Yes    Comment: occasional   Drug use: No   Sexual activity: Yes    Birth control/protection: None    Comment: smokes pipe-occ cigar

## 2022-02-12 ENCOUNTER — Encounter: Payer: Self-pay | Admitting: Orthopedic Surgery

## 2022-02-17 ENCOUNTER — Other Ambulatory Visit: Payer: Self-pay

## 2022-02-17 ENCOUNTER — Encounter: Payer: Self-pay | Admitting: Orthopedic Surgery

## 2022-02-17 DIAGNOSIS — Z96611 Presence of right artificial shoulder joint: Secondary | ICD-10-CM

## 2022-02-20 ENCOUNTER — Ambulatory Visit (HOSPITAL_BASED_OUTPATIENT_CLINIC_OR_DEPARTMENT_OTHER): Payer: 59 | Attending: Orthopedic Surgery | Admitting: Physical Therapy

## 2022-02-20 ENCOUNTER — Encounter (HOSPITAL_BASED_OUTPATIENT_CLINIC_OR_DEPARTMENT_OTHER): Payer: Self-pay | Admitting: Physical Therapy

## 2022-02-20 DIAGNOSIS — Z96611 Presence of right artificial shoulder joint: Secondary | ICD-10-CM | POA: Diagnosis not present

## 2022-02-20 DIAGNOSIS — M25511 Pain in right shoulder: Secondary | ICD-10-CM | POA: Insufficient documentation

## 2022-02-20 DIAGNOSIS — M6281 Muscle weakness (generalized): Secondary | ICD-10-CM | POA: Insufficient documentation

## 2022-02-20 DIAGNOSIS — G8929 Other chronic pain: Secondary | ICD-10-CM | POA: Insufficient documentation

## 2022-02-20 DIAGNOSIS — M25611 Stiffness of right shoulder, not elsewhere classified: Secondary | ICD-10-CM | POA: Insufficient documentation

## 2022-02-20 NOTE — Therapy (Signed)
OUTPATIENT PHYSICAL THERAPY SHOULDER EVALUATION   Patient Name: Jason Norton MRN: [id number] DOB:[birth year], 66 y.o., male Today's Date: 02/20/2022   PT End of Session - 02/20/22 1046     Visit Number 1    Number of Visits 16    Date for PT Re-Evaluation 04/17/22    Authorization Type [facility] U[facility]    PT Start Time 0930    PT Stop Time 1012    PT Time Calculation (min) 42 min    Activity Tolerance Patient tolerated treatment well    Behavior During Therapy WFL for tasks assessed/performed             Past Medical History:  Diagnosis Date   Carpal tunnel syndrome    Clotting disorder (HCC)    Diverticulosis    DJD (degenerative joint disease)    Gout    Hyperlipemia    Hypothyroidism    Meningitis    at age 53 weeks old   PE (pulmonary embolism) 09/12/2003   hx post freq [id number]   Umbilical hernia    Past Surgical History:  Procedure Laterality Date   BACK SURGERY     lumb lam   CARPAL TUNNEL RELEASE  08/27/2012   Procedure: CARPAL TUNNEL RELEASE;  Surgeon: Roseanne Kaufman, MD;  Location: [place];  Service: Orthopedics;  Laterality: Right;  Right Limited Open Carpal Tunnel Release    COLONOSCOPY     I & D EXTREMITY  09/07/2012   Procedure: IRRIGATION AND DEBRIDEMENT EXTREMITY;  Surgeon: Linna Hoff, MD;  Location: [place];  Service: Orthopedics;  Laterality: Right;   INSERTION OF MESH N/A 05/12/2015   Procedure: INSERTION OF MESH;  Surgeon: Ralene Ok, MD;  Location: [place];  Service: General;  Laterality: N/A;   KNEE ARTHROPLASTY Right 09/11/1973   right knee reconstruction hign [id number]   KNEE ARTHROSCOPY Right    x3   REVERSE SHOULDER ARTHROPLASTY Right 01/24/2022   Procedure: RIGHT REVERSE SHOULDER ARTHROPLASTY;  Surgeon: Meredith Pel, MD;  Location: [place];  Service: Orthopedics;  Laterality: Right;   STERIOD INJECTION  08/27/2012   Procedure: STEROID INJECTION;  Surgeon: Roseanne Kaufman, MD;  Location: [place];  Service: Orthopedics;  Laterality: Left;   TOTAL HIP ARTHROPLASTY  09/11/2010   left   TOTAL KNEE ARTHROPLASTY  09/11/2005   rt   TOTAL KNEE REVISION  29/93/7169   rt   UMBILICAL HERNIA REPAIR N/A 05/12/2015   Procedure: LAPAROSCOPIC UMBILICAL HERNIA REPAIR;  Surgeon: Ralene Ok, MD;  Location: [place];  Service: General;  Laterality: N/A;   WRIST ARTHROSCOPY  09/11/2004   debrid-rt   Patient Active Problem List   Diagnosis Date Noted   Arthritis of right shoulder region    S/P reverse total shoulder arthroplasty, right 67/89/3810   Umbilical hernia 17/51/0258   Hematoma, postoperative 09/07/2012   Chronic anticoagulation 09/07/2012   GOUT 10/25/2007   History of pulmonary embolism 10/25/2007   DIARRHEA 10/25/2007   PERSONAL HISTORY OF ARTHRITIS 10/25/2007    PCP: Dr Domenick Gong   REFERRING PROVIDER: Dr Meredith Pel   REFERRING DIAG: Right Reverse Total Shoulder   THERAPY DIAG:  Chronic right shoulder pain  Stiffness of right shoulder, not elsewhere classified  Muscle weakness (generalized)  Rationale for Evaluation and Treatment Rehabilitation  ONSET DATE: 01/24/2022  SUBJECTIVE:  SUBJECTIVE STATEMENT: The patient had a revers TSA on 01/24/2022. His pain is well controlled at this point. He is no longer taking the oxy. His MD D/C'd the sling.   PERTINENT HISTORY:   PAIN:  Are you having pain? Yes: NPRS scale: 5-6/10 at worst  Pain location: lateral shoulder  Pain description: aching  Aggravating factors: steri strips pull at times  Relieving factors: rest  PRECAUTIONS: None and Shoulder TSA precautions   WEIGHT BEARING RESTRICTIONS  None   FALLS:  Has patient fallen in last 6 months? No  LIVING ENVIRONMENT: Nothing pertinent   OCCUPATION: Retired  Hobbies: basketball   PLOF: Independent  PATIENT GOALS   To improve functional use of his shoulder.   OBJECTIVE:   DIAGNOSTIC FINDINGS:    PATIENT SURVEYS:    COGNITION:FOTO    Overall cognitive status: Within functional limits for tasks assessed     SENSATION: WFL  POSTURE:   UPPER EXTREMITY ROM:   Passive ROM Right eval Left eval  Shoulder flexion    Shoulder extension    Shoulder abduction    Shoulder adduction    Shoulder internal rotation    Shoulder external rotation    Elbow flexion    Elbow extension    Wrist flexion    Wrist extension    Wrist ulnar deviation    Wrist radial deviation    Wrist pronation    Wrist supination    (Blank rows = not tested)   Active ROM Right eval Left eval  Shoulder flexion    Shoulder extension    Shoulder abduction    Shoulder adduction    Shoulder internal rotation    Shoulder external rotation    Elbow flexion    Elbow extension    Wrist flexion    Wrist extension    Wrist ulnar deviation    Wrist radial deviation    Wrist pronation    Wrist supination    (Blank rows = not tested)  UPPER EXTREMITY MMT:  MMT Right eval Left eval  Shoulder flexion    Shoulder extension    Shoulder abduction    Shoulder adduction    Shoulder internal rotation    Shoulder external rotation    Middle trapezius    Lower trapezius    Elbow flexion    Elbow extension    Wrist flexion    Wrist extension    Wrist ulnar deviation    Wrist radial deviation    Wrist pronation    Wrist supination    Grip strength (lbs)    (Blank rows = not tested) Not assessed 2nd to recent surgery    PALPATION:  Mild tenderness in his    TODAY'S TREATMENT:     PATIENT EDUCATION: Education details: reviewed HEP; symptom management; progression of activity  Person educated: Patient Education method: Consulting civil engineer, Demonstration, Tactile cues, Verbal cues, and Handouts Education comprehension:  verbalized understanding, returned demonstration, and needs further education   HOME EXERCISE PROGRAM: Reviewed pendulums and scap retraction to neutral at home.   ASSESSMENT:  CLINICAL IMPRESSION: Patient is a 66 y.o. M who was seen today for physical therapy evaluation and treatment for right reverse TSA on 01/24/2022. His pain is well controlled at this time. He presents with expected limitations in motions, strength, and function. He would benefit from skilled therapy to return to prior level of function and improve his ability to perform ADL's    OBJECTIVE IMPAIRMENTS decreased mobility, decreased ROM, decreased strength, impaired UE functional  use, and pain.   ACTIVITY LIMITATIONS carrying, lifting, bathing, dressing, reach over head, and hygiene/grooming  PARTICIPATION LIMITATIONS: meal prep, cleaning, driving, and yard work  PERSONAL FACTORS 3+ comorbidities: Low Back DDD; Thyroid disorder; right carpal tunnel release  are also affecting patient's functional outcome.   REHAB POTENTIAL: Excellent  CLINICAL DECISION MAKING: Stable/uncomplicated  EVALUATION COMPLEXITY: Low   GOALS: Goals reviewed with patient? Yes  SHORT TERM GOALS: Target date: 03/20/2022   Patient will demonstrate 45 degrees of active right shoulder ER  Baseline: Goal status: INITIAL  2.  Patient will be demonstrate 90 degrees of active right shoulder flexion  Baseline:  Goal status: INITIAL  3.  Patient will be independent with basic HEP  Baseline:  Goal status: INITIAL  LONG TERM GOALS: Target date: 04/17/2022  Patient will use his right arm for all ADL's  Baseline:  Goal status: INITIAL  2.  Patient will return to drumming  Baseline:  Goal status: INITIAL  3.  Patient will be independent with a full exercise program  Baseline:  Goal status: INITIAL   PLAN: PT FREQUENCY: 2x/week  PT DURATION: 8 weeks  PLANNED INTERVENTIONS: Therapeutic exercises, Therapeutic activity, Neuromuscular  re-education, Balance training, Gait training, Patient/Family education, Joint mobilization, Aquatic Therapy, Dry Needling, Cryotherapy, Moist heat, Taping, Ultrasound, and Manual therapy  PLAN FOR NEXT SESSION:  begin AAROM; consider RTC iso's; supine active ER in pain free range; PROM into all planes per instability protocol.    Carney Living, PT 02/20/2022, 12:38 PM

## 2022-02-22 ENCOUNTER — Ambulatory Visit (HOSPITAL_BASED_OUTPATIENT_CLINIC_OR_DEPARTMENT_OTHER): Payer: 59 | Admitting: Physical Therapy

## 2022-02-22 ENCOUNTER — Encounter (HOSPITAL_BASED_OUTPATIENT_CLINIC_OR_DEPARTMENT_OTHER): Payer: Self-pay | Admitting: Physical Therapy

## 2022-02-22 DIAGNOSIS — M6281 Muscle weakness (generalized): Secondary | ICD-10-CM

## 2022-02-22 DIAGNOSIS — M25611 Stiffness of right shoulder, not elsewhere classified: Secondary | ICD-10-CM | POA: Diagnosis not present

## 2022-02-22 DIAGNOSIS — G8929 Other chronic pain: Secondary | ICD-10-CM

## 2022-02-22 DIAGNOSIS — M25511 Pain in right shoulder: Secondary | ICD-10-CM | POA: Diagnosis not present

## 2022-02-22 DIAGNOSIS — Z96611 Presence of right artificial shoulder joint: Secondary | ICD-10-CM | POA: Diagnosis not present

## 2022-02-22 NOTE — Therapy (Signed)
OUTPATIENT PHYSICAL THERAPY SHOULDER EVALUATION   Patient Name: Jason Norton MRN: [id number] DOB:1956/09/11, 66 y.o., male Today's Date: 02/22/2022   PT End of Session - 02/22/22 1123     Visit Number 2    Number of Visits 16    Date for PT Re-Evaluation 04/17/22    Authorization Type [facility] U[facility]    PT Start Time 1100    PT Stop Time 1140    PT Time Calculation (min) 40 min    Activity Tolerance Patient tolerated treatment well    Behavior During Therapy WFL for tasks assessed/performed             Past Medical History:  Diagnosis Date   Carpal tunnel syndrome    Clotting disorder (HCC)    Diverticulosis    DJD (degenerative joint disease)    Gout    Hyperlipemia    Hypothyroidism    Meningitis    at age 13 weeks old   PE (pulmonary embolism) 09/12/2003   hx post freq [id number]   Umbilical hernia    Past Surgical History:  Procedure Laterality Date   BACK SURGERY     lumb lam   CARPAL TUNNEL RELEASE  08/27/2012   Procedure: CARPAL TUNNEL RELEASE;  Surgeon: Roseanne Kaufman, MD;  Location: [place];  Service: Orthopedics;  Laterality: Right;  Right Limited Open Carpal Tunnel Release    COLONOSCOPY     I & D EXTREMITY  09/07/2012   Procedure: IRRIGATION AND DEBRIDEMENT EXTREMITY;  Surgeon: Linna Hoff, MD;  Location: [place];  Service: Orthopedics;  Laterality: Right;   INSERTION OF MESH N/A 05/12/2015   Procedure: INSERTION OF MESH;  Surgeon: Ralene Ok, MD;  Location: [place];  Service: General;  Laterality: N/A;   KNEE ARTHROPLASTY Right 09/11/1973   right knee reconstruction hign [id number]   KNEE ARTHROSCOPY Right    x3   REVERSE SHOULDER ARTHROPLASTY Right 01/24/2022   Procedure: RIGHT REVERSE SHOULDER ARTHROPLASTY;  Surgeon: Meredith Pel, MD;  Location: [place];  Service: Orthopedics;  Laterality: Right;   STERIOD INJECTION  08/27/2012   Procedure: STEROID INJECTION;  Surgeon: Roseanne Kaufman, MD;  Location: [place];  Service: Orthopedics;  Laterality: Left;   TOTAL HIP ARTHROPLASTY  09/11/2010   left   TOTAL KNEE ARTHROPLASTY  09/11/2005   rt   TOTAL KNEE REVISION  87/86/7672   rt   UMBILICAL HERNIA REPAIR N/A 05/12/2015   Procedure: LAPAROSCOPIC UMBILICAL HERNIA REPAIR;  Surgeon: Ralene Ok, MD;  Location: [place];  Service: General;  Laterality: N/A;   WRIST ARTHROSCOPY  09/11/2004   debrid-rt   Patient Active Problem List   Diagnosis Date Noted   Arthritis of right shoulder region    S/P reverse total shoulder arthroplasty, right 09/47/0962   Umbilical hernia 83/66/2947   Hematoma, postoperative 09/07/2012   Chronic anticoagulation 09/07/2012   GOUT 10/25/2007   History of pulmonary embolism 10/25/2007   DIARRHEA 10/25/2007   PERSONAL HISTORY OF ARTHRITIS 10/25/2007    PCP: Dr Domenick Gong   REFERRING PROVIDER: Dr Meredith Pel   REFERRING DIAG: Right Reverse Total Shoulder   THERAPY DIAG:  Chronic right shoulder pain  Stiffness of right shoulder, not elsewhere classified  Muscle weakness (generalized)  Rationale for Evaluation and Treatment Rehabilitation  ONSET DATE: 01/24/2022  SUBJECTIVE:  SUBJECTIVE STATEMENT: Patient is approx 1-mo post-op and reports his shoulder has been feeling great - pain is already improved to better than prior to surgery. States he was able to do more around the house the past couple days with some increased soreness.  PERTINENT HISTORY:   PAIN:  Are you having pain? Yes: NPRS scale: 5-6/10 at worst  Pain location: lateral shoulder  Pain description: aching  Aggravating factors: steri strips pull at times  Relieving factors: rest  PRECAUTIONS: None and Shoulder TSA precautions   WEIGHT BEARING RESTRICTIONS  None   FALLS:  Has patient fallen  in last 6 months? No  LIVING ENVIRONMENT: Nothing pertinent  OCCUPATION: Retired  Hobbies: basketball   PLOF: Independent  PATIENT GOALS   To improve functional use of his shoulder.   OBJECTIVE:   DIAGNOSTIC FINDINGS:    PATIENT SURVEYS:    COGNITION:FOTO    Overall cognitive status: Within functional limits for tasks assessed     SENSATION: WFL  POSTURE:   UPPER EXTREMITY ROM:   Passive ROM Right eval Left eval  Shoulder flexion    Shoulder extension    Shoulder abduction    Shoulder adduction    Shoulder internal rotation    Shoulder external rotation    Elbow flexion    Elbow extension    Wrist flexion    Wrist extension    Wrist ulnar deviation    Wrist radial deviation    Wrist pronation    Wrist supination    (Blank rows = not tested)   Active ROM Right eval Left eval  Shoulder flexion    Shoulder extension    Shoulder abduction    Shoulder adduction    Shoulder internal rotation    Shoulder external rotation    Elbow flexion    Elbow extension    Wrist flexion    Wrist extension    Wrist ulnar deviation    Wrist radial deviation    Wrist pronation    Wrist supination    (Blank rows = not tested)  UPPER EXTREMITY MMT:  MMT Right eval Left eval  Shoulder flexion    Shoulder extension    Shoulder abduction    Shoulder adduction    Shoulder internal rotation    Shoulder external rotation    Middle trapezius    Lower trapezius    Elbow flexion    Elbow extension    Wrist flexion    Wrist extension    Wrist ulnar deviation    Wrist radial deviation    Wrist pronation    Wrist supination    Grip strength (lbs)    (Blank rows = not tested) Not assessed 2nd to recent surgery    PALPATION:  Mild tenderness in his    TODAY'S TREATMENT:   6/14 P[place]ys warm-up - 72mn PROM flexion and ER with grade I/II AP and inf mobs Wand press - x10 1lb Wand AAROM flexion - 2x10 1lb Flex/IR/ER wall isometrics - 2x10, 3 sec  hold Shoulder row, extension - x10, yellow band, cues for limiting extension   PATIENT EDUCATION: Education details: reviewed HEP; symptom management; progression of activity  Person educated: Patient Education method: EConsulting civil engineer Demonstration, Tactile cues, Verbal cues, and Handouts Education comprehension: verbalized understanding, returned demonstration, and needs further education   HOME EXERCISE PROGRAM: Access Code [email or web address]  ASSESSMENT:  CLINICAL IMPRESSION: Patient presents today with minimal pain and reports increase in activity. Pt ROM has improved and tolerated isometrics and gentle scap  exercises well without increase in pain, some mild muscle soreness noted. Pt is highly motivated and eager to advance exercise - provided education on plan of care and reminder of restrictions. Pt will continue to benefit from skilled therapy to improve ROM, strength, and return pt to full function.   OBJECTIVE IMPAIRMENTS decreased mobility, decreased ROM, decreased strength, impaired UE functional use, and pain.   ACTIVITY LIMITATIONS carrying, lifting, bathing, dressing, reach over head, and hygiene/grooming  PARTICIPATION LIMITATIONS: meal prep, cleaning, driving, and yard work  PERSONAL FACTORS 3+ comorbidities: Low Back DDD; Thyroid disorder; right carpal tunnel release  are also affecting patient's functional outcome.   REHAB POTENTIAL: Excellent  CLINICAL DECISION MAKING: Stable/uncomplicated  EVALUATION COMPLEXITY: Low   GOALS: Goals reviewed with patient? Yes  SHORT TERM GOALS: Target date: 03/20/2022   Patient will demonstrate 45 degrees of active right shoulder ER  Baseline: Goal status: INITIAL  2.  Patient will be demonstrate 90 degrees of active right shoulder flexion  Baseline:  Goal status: INITIAL  3.  Patient will be independent with basic HEP  Baseline:  Goal status: INITIAL  LONG TERM GOALS: Target date: 04/17/2022  Patient will use his right arm  for all ADL's  Baseline:  Goal status: INITIAL  2.  Patient will return to drumming  Baseline:  Goal status: INITIAL  3.  Patient will be independent with a full exercise program  Baseline:  Goal status: INITIAL   PLAN: PT FREQUENCY: 2x/week  PT DURATION: 8 weeks  PLANNED INTERVENTIONS: Therapeutic exercises, Therapeutic activity, Neuromuscular re-education, Balance training, Gait training, Patient/Family education, Joint mobilization, Aquatic Therapy, Dry Needling, Cryotherapy, Moist heat, Taping, Ultrasound, and Manual therapy  PLAN FOR NEXT SESSION:  Continue PROM, AAROM, isometrics, and stability exercise per protocol.   Carney Living, PT 02/22/2022, 11:32 AM

## 2022-02-27 ENCOUNTER — Encounter (HOSPITAL_BASED_OUTPATIENT_CLINIC_OR_DEPARTMENT_OTHER): Payer: Self-pay | Admitting: Physical Therapy

## 2022-02-27 ENCOUNTER — Ambulatory Visit (HOSPITAL_BASED_OUTPATIENT_CLINIC_OR_DEPARTMENT_OTHER): Payer: 59 | Admitting: Physical Therapy

## 2022-02-27 DIAGNOSIS — Z96611 Presence of right artificial shoulder joint: Secondary | ICD-10-CM | POA: Diagnosis not present

## 2022-02-27 DIAGNOSIS — M6281 Muscle weakness (generalized): Secondary | ICD-10-CM

## 2022-02-27 DIAGNOSIS — G8929 Other chronic pain: Secondary | ICD-10-CM

## 2022-02-27 DIAGNOSIS — M25611 Stiffness of right shoulder, not elsewhere classified: Secondary | ICD-10-CM | POA: Diagnosis not present

## 2022-02-27 DIAGNOSIS — M25511 Pain in right shoulder: Secondary | ICD-10-CM | POA: Diagnosis not present

## 2022-02-27 NOTE — Therapy (Signed)
OUTPATIENT PHYSICAL THERAPY SHOULDER EVALUATION   Patient Name: Jason Norton MRN: [id number] DOB:03/03/1956, 66 y.o., male Today's Date: 02/27/2022   PT End of Session - 02/27/22 1357     Visit Number 3    Number of Visits 16    Date for PT Re-Evaluation 04/17/22    Authorization Type [facility]    PT Start Time 0930    PT Stop Time 1012    PT Time Calculation (min) 42 min    Activity Tolerance Patient tolerated treatment well    Behavior During Therapy WFL for tasks assessed/performed              Past Medical History:  Diagnosis Date   Carpal tunnel syndrome    Clotting disorder (HCC)    Diverticulosis    DJD (degenerative joint disease)    Gout    Hyperlipemia    Hypothyroidism    Meningitis    at age 54 weeks old   PE (pulmonary embolism) 09/12/2003   hx post freq [id number]   Umbilical hernia    Past Surgical History:  Procedure Laterality Date   BACK SURGERY     lumb lam   CARPAL TUNNEL RELEASE  08/27/2012   Procedure: CARPAL TUNNEL RELEASE;  Surgeon: Roseanne Kaufman, MD;  Location: [place];  Service: Orthopedics;  Laterality: Right;  Right Limited Open Carpal Tunnel Release    COLONOSCOPY     I & D EXTREMITY  09/07/2012   Procedure: IRRIGATION AND DEBRIDEMENT EXTREMITY;  Surgeon: Linna Hoff, MD;  Location: [place];  Service: Orthopedics;  Laterality: Right;   INSERTION OF MESH N/A 05/12/2015   Procedure: INSERTION OF MESH;  Surgeon: Ralene Ok, MD;  Location: [place];  Service: General;  Laterality: N/A;   KNEE ARTHROPLASTY Right 09/11/1973   right knee reconstruction hign [id number]   KNEE ARTHROSCOPY Right    x3   REVERSE SHOULDER ARTHROPLASTY Right 01/24/2022   Procedure: RIGHT REVERSE SHOULDER ARTHROPLASTY;  Surgeon: Meredith Pel, MD;  Location: [place];  Service: Orthopedics;  Laterality: Right;   STERIOD INJECTION  08/27/2012   Procedure: STEROID INJECTION;  Surgeon: Roseanne Kaufman, MD;  Location: [place];  Service: Orthopedics;  Laterality: Left;   TOTAL HIP ARTHROPLASTY  09/11/2010   left   TOTAL KNEE ARTHROPLASTY  09/11/2005   rt   TOTAL KNEE REVISION  11/30/2246   rt   UMBILICAL HERNIA REPAIR N/A 05/12/2015   Procedure: LAPAROSCOPIC UMBILICAL HERNIA REPAIR;  Surgeon: Ralene Ok, MD;  Location: [place];  Service: General;  Laterality: N/A;   WRIST ARTHROSCOPY  09/11/2004   debrid-rt   Patient Active Problem List   Diagnosis Date Noted   Arthritis of right shoulder region    S/P reverse total shoulder arthroplasty, right 25/00/3704   Umbilical hernia 88/89/1694   Hematoma, postoperative 09/07/2012   Chronic anticoagulation 09/07/2012   GOUT 10/25/2007   History of pulmonary embolism 10/25/2007   DIARRHEA 10/25/2007   PERSONAL HISTORY OF ARTHRITIS 10/25/2007    PCP: Dr Domenick Gong   REFERRING PROVIDER: Dr Meredith Pel   REFERRING DIAG: Right Reverse Total Shoulder   THERAPY DIAG:  Chronic right shoulder pain  Stiffness of right shoulder, not elsewhere classified  Muscle weakness (generalized)  Rationale for Evaluation and Treatment Rehabilitation  ONSET DATE: 01/24/2022  SUBJECTIVE:  SUBJECTIVE STATEMENT: Patient is approx 1-mo post-op reverse TSA. He reports it was a little more sore this weekend, but he has been doing more activity around the home. He reports an increase in low back pain from activity.   PERTINENT HISTORY: LBP, Carpal tunnel, DJD, PE's Low back surgery,   PAIN:  Are you having pain? Yes: NPRS scale: 3/10 at worst  Pain location: lateral shoulder  Pain description: aching  Aggravating factors: steri strips pull at times  Relieving factors: rest  PRECAUTIONS: None and Shoulder TSA precautions   WEIGHT BEARING RESTRICTIONS  None   FALLS:  Has  patient fallen in last 6 months? No  LIVING ENVIRONMENT: Nothing pertinent  OCCUPATION: Retired  Hobbies: basketball   PLOF: Independent  PATIENT GOALS   To improve functional use of his shoulder.   OBJECTIVE:     TODAY'S TREATMENT:  6/19 Pulleys warm-up - 52mn PROM flexion and ER with grade I/II AP and inf mobs Wand AAROM flexion - 1x10 1lb, 1lb dumbbell Flex/IR/ER/ext wall isometrics - 2x10, 3 sec hold Shoulder row - 2x10, green band Shoulder extension - 2x10, yellow band    6/14 Pulleys warm-up - 232m PROM flexion and ER with grade I/II AP and inf mobs Wand press - x10 1lb Wand AAROM flexion - 2x10 1lb Flex/IR/ER wall isometrics - 2x10, 3 sec hold Shoulder row, extension - x10, yellow band, cues for limiting extension   PATIENT EDUCATION: Education details: reviewed HEP; symptom management; progression of activity  Person educated: Patient Education method: Explanation, Demonstration, Tactile cues, Verbal cues, and Handouts Education comprehension: verbalized understanding, returned demonstration, and needs further education   HOME EXERCISE PROGRAM: Access Code [email or web address] ASSESSMENT:  CLINICAL IMPRESSION: Patient presents today with some increased soreness and pain following increase in ADL function over the weekend. PROM continues to show steady improvement. Pt tolerates extension isometrics and increase in row resistance without pain. Pt will continue to benefit from skilled therapy to improve ROM, strength, and return pt to full function.   OBJECTIVE IMPAIRMENTS decreased mobility, decreased ROM, decreased strength, impaired UE functional use, and pain.   ACTIVITY LIMITATIONS carrying, lifting, bathing, dressing, reach over head, and hygiene/grooming  PARTICIPATION LIMITATIONS: meal prep, cleaning, driving, and yard work  PERSONAL FACTORS 3+ comorbidities: Low Back DDD; Thyroid disorder; right carpal tunnel release  are also affecting patient's  functional outcome.   REHAB POTENTIAL: Excellent  CLINICAL DECISION MAKING: Stable/uncomplicated  EVALUATION COMPLEXITY: Low   GOALS: Goals reviewed with patient? Yes  SHORT TERM GOALS: Target date: 03/20/2022   Patient will demonstrate 45 degrees of active right shoulder ER  Baseline: Goal status: INITIAL  2.  Patient will be demonstrate 90 degrees of active right shoulder flexion  Baseline:  Goal status: INITIAL  3.  Patient will be independent with basic HEP  Baseline:  Goal status: INITIAL  LONG TERM GOALS: Target date: 04/17/2022  Patient will use his right arm for all ADL's  Baseline:  Goal status: INITIAL  2.  Patient will return to drumming  Baseline:  Goal status: INITIAL  3.  Patient will be independent with a full exercise program  Baseline:  Goal status: INITIAL   PLAN: PT FREQUENCY: 2x/week  PT DURATION: 8 weeks  PLANNED INTERVENTIONS: Therapeutic exercises, Therapeutic activity, Neuromuscular re-education, Balance training, Gait training, Patient/Family education, Joint mobilization, Aquatic Therapy, Dry Needling, Cryotherapy, Moist heat, Taping, Ultrasound, and Manual therapy  PLAN FOR NEXT SESSION:  Continue PROM, AAROM, isometrics, and stability exercise per protocol.  Carney Living, PT 02/27/2022, 4:10 PM

## 2022-03-01 ENCOUNTER — Ambulatory Visit (HOSPITAL_BASED_OUTPATIENT_CLINIC_OR_DEPARTMENT_OTHER): Payer: 59 | Admitting: Physical Therapy

## 2022-03-02 NOTE — Therapy (Signed)
The patient came into the clinic stating that yesterday he reached down to pick up his dog and felt a pop. He had extreme pain for a while after. The pain has gone down but he is still very stiff. He declines PT today. He was advised that he should contact his MD. He states he has an appointment with him next week. He reports as long as it stays like it is he will have it checked out then. He was advised if he feels anything different to call his MD right away.

## 2022-03-06 ENCOUNTER — Other Ambulatory Visit (HOSPITAL_COMMUNITY): Payer: Self-pay

## 2022-03-06 MED ORDER — WEGOVY 2.4 MG/0.75ML ~~LOC~~ SOAJ
2.4000 mg | SUBCUTANEOUS | 0 refills | Status: DC
  Filled 2022-03-06 – 2022-03-08 (×3): qty 3, 28d supply, fill #0

## 2022-03-07 ENCOUNTER — Other Ambulatory Visit (HOSPITAL_COMMUNITY): Payer: Self-pay

## 2022-03-08 ENCOUNTER — Encounter: Payer: Self-pay | Admitting: Orthopedic Surgery

## 2022-03-08 ENCOUNTER — Ambulatory Visit (INDEPENDENT_AMBULATORY_CARE_PROVIDER_SITE_OTHER): Payer: 59 | Admitting: Orthopedic Surgery

## 2022-03-08 ENCOUNTER — Other Ambulatory Visit (HOSPITAL_COMMUNITY): Payer: Self-pay

## 2022-03-08 ENCOUNTER — Ambulatory Visit (INDEPENDENT_AMBULATORY_CARE_PROVIDER_SITE_OTHER): Payer: 59

## 2022-03-08 ENCOUNTER — Encounter (HOSPITAL_BASED_OUTPATIENT_CLINIC_OR_DEPARTMENT_OTHER): Payer: 59 | Admitting: Physical Therapy

## 2022-03-08 DIAGNOSIS — Z96611 Presence of right artificial shoulder joint: Secondary | ICD-10-CM | POA: Diagnosis not present

## 2022-03-08 MED ORDER — DOXYCYCLINE HYCLATE 100 MG PO CAPS
100.0000 mg | ORAL_CAPSULE | Freq: Two times a day (BID) | ORAL | 1 refills | Status: DC
  Filled 2022-03-08: qty 180, 90d supply, fill #0
  Filled 2022-05-01 – 2022-05-29 (×3): qty 180, 90d supply, fill #1

## 2022-03-08 NOTE — Progress Notes (Signed)
Post-Op Visit Note   Patient: Jason Norton           Date of Birth: 1956/07/13           MRN: [id number] Visit Date: 03/08/2022 PCP: Haywood Pao, MD   Assessment & Plan:  Chief Complaint:  Chief Complaint  Patient presents with   Right Shoulder - Routine Post Op    reverse total shoulder arthroplasty of the right shoulder on 01/24/2022   Visit Diagnoses:  1. S/P reverse total shoulder arthroplasty, right     Plan: Jason Norton is a 66 year old patient underwent right reverse shoulder replacement 01/24/2022.  Week and a half ago he felt a pop when he was picking up his 8 pound dog while bent at the waist.  Pain has improved some since that time.  On examination he has passive range of motion of 60/90/110.  Deltoid is functional.  Subscap strength also feels good along with external rotation strength.  No discrete tenderness over the acromion.  Radiographs show no change in hardware alignment or position.  Review of the op note demonstrates that the patient had an extremely thin supraspinatus and that may be what popped with his motion.  Nonetheless with the reverse replacement I do not expect a significant functional problem with that event.  Plan at this time is to continue with physical therapy to work on functional range of motion and strengthening.  We will need to do ultrasound-guided injection into the left glenohumeral joint on his return visit in 6 weeks.  Follow-Up Instructions: No follow-ups on file.   Orders:  Orders Placed This Encounter  Procedures   XR Shoulder Right   No orders of the defined types were placed in this encounter.   Imaging: No results found.  PMFS History: Patient Active Problem List   Diagnosis Date Noted   Arthritis of right shoulder region    S/P reverse total shoulder arthroplasty, right 10/93/2355   Umbilical hernia 73/22/0254   Hematoma, postoperative 09/07/2012   Chronic anticoagulation 09/07/2012   GOUT 10/25/2007   History of  pulmonary embolism 10/25/2007   DIARRHEA 10/25/2007   PERSONAL HISTORY OF ARTHRITIS 10/25/2007   Past Medical History:  Diagnosis Date   Carpal tunnel syndrome    Clotting disorder (HCC)    Diverticulosis    DJD (degenerative joint disease)    Gout    Hyperlipemia    Hypothyroidism    Meningitis    at age 12 weeks old   PE (pulmonary embolism) 09/12/2003   hx post freq [id number]   Umbilical hernia     Family History  Adopted: Yes    Past Surgical History:  Procedure Laterality Date   BACK SURGERY     lumb lam   CARPAL TUNNEL RELEASE  08/27/2012   Procedure: CARPAL TUNNEL RELEASE;  Surgeon: Roseanne Kaufman, MD;  Location: [place];  Service: Orthopedics;  Laterality: Right;  Right Limited Open Carpal Tunnel Release    COLONOSCOPY     I & D EXTREMITY  09/07/2012   Procedure: IRRIGATION AND DEBRIDEMENT EXTREMITY;  Surgeon: Linna Hoff, MD;  Location: [place];  Service: Orthopedics;  Laterality: Right;   INSERTION OF MESH N/A 05/12/2015   Procedure: INSERTION OF MESH;  Surgeon: Ralene Ok, MD;  Location: [place];  Service: General;  Laterality: N/A;   KNEE ARTHROPLASTY Right 09/11/1973   right knee reconstruction hign [id number]   KNEE ARTHROSCOPY Right    x3   REVERSE SHOULDER  ARTHROPLASTY Right 01/24/2022   Procedure: RIGHT REVERSE SHOULDER ARTHROPLASTY;  Surgeon: Meredith Pel, MD;  Location: [place];  Service: Orthopedics;  Laterality: Right;   STERIOD INJECTION  08/27/2012   Procedure: STEROID INJECTION;  Surgeon: Roseanne Kaufman, MD;  Location: [place];  Service: Orthopedics;  Laterality: Left;   TOTAL HIP ARTHROPLASTY  09/11/2010   left   TOTAL KNEE ARTHROPLASTY  09/11/2005   rt   TOTAL KNEE REVISION  12/75/1700   rt   UMBILICAL HERNIA REPAIR N/A 05/12/2015   Procedure: LAPAROSCOPIC UMBILICAL HERNIA REPAIR;  Surgeon: Ralene Ok, MD;  Location: [place];  Service: General;  Laterality: N/A;   WRIST ARTHROSCOPY  09/11/2004    debrid-rt   Social History   Occupational History   Not on file  Tobacco Use   Smoking status: Former    Types: Pipe   Smokeless tobacco: Never   Tobacco comments:    " quit a couple of years ago"( 05/11/15)  Vaping Use   Vaping Use: Never used  Substance and Sexual Activity   Alcohol use: Yes    Comment: occasional   Drug use: No   Sexual activity: Yes    Birth control/protection: None    Comment: smokes pipe-occ cigar

## 2022-03-09 ENCOUNTER — Ambulatory Visit (HOSPITAL_BASED_OUTPATIENT_CLINIC_OR_DEPARTMENT_OTHER): Payer: 59 | Admitting: Physical Therapy

## 2022-03-09 ENCOUNTER — Encounter (HOSPITAL_BASED_OUTPATIENT_CLINIC_OR_DEPARTMENT_OTHER): Payer: Self-pay | Admitting: Physical Therapy

## 2022-03-09 DIAGNOSIS — G8929 Other chronic pain: Secondary | ICD-10-CM | POA: Diagnosis not present

## 2022-03-09 DIAGNOSIS — M25511 Pain in right shoulder: Secondary | ICD-10-CM | POA: Diagnosis not present

## 2022-03-09 DIAGNOSIS — Z96611 Presence of right artificial shoulder joint: Secondary | ICD-10-CM | POA: Diagnosis not present

## 2022-03-09 DIAGNOSIS — M25611 Stiffness of right shoulder, not elsewhere classified: Secondary | ICD-10-CM

## 2022-03-09 DIAGNOSIS — M6281 Muscle weakness (generalized): Secondary | ICD-10-CM | POA: Diagnosis not present

## 2022-03-09 NOTE — Therapy (Signed)
OUTPATIENT PHYSICAL THERAPY SHOULDER EVALUATION   Patient Name: Jason Norton MRN: [id number] DOB:06/17/1956, 66 y.o., male Today's Date: 03/09/2022   PT End of Session - 03/09/22 0901     Visit Number 4    Number of Visits 16    Date for PT Re-Evaluation 04/17/22    Authorization Type [facility]    PT Start Time 0845    PT Stop Time 0926    PT Time Calculation (min) 41 min    Activity Tolerance Patient tolerated treatment well    Behavior During Therapy [facility] for tasks assessed/performed              Past Medical History:  Diagnosis Date   Carpal tunnel syndrome    Clotting disorder (HCC)    Diverticulosis    DJD (degenerative joint disease)    Gout    Hyperlipemia    Hypothyroidism    Meningitis    at age 33 weeks old   PE (pulmonary embolism) 09/12/2003   hx post freq [id number]   Umbilical hernia    Past Surgical History:  Procedure Laterality Date   BACK SURGERY     lumb lam   CARPAL TUNNEL RELEASE  08/27/2012   Procedure: CARPAL TUNNEL RELEASE;  Surgeon: Roseanne Kaufman, MD;  Location: [place];  Service: Orthopedics;  Laterality: Right;  Right Limited Open Carpal Tunnel Release    COLONOSCOPY     I & D EXTREMITY  09/07/2012   Procedure: IRRIGATION AND DEBRIDEMENT EXTREMITY;  Surgeon: Linna Hoff, MD;  Location: [place];  Service: Orthopedics;  Laterality: Right;   INSERTION OF MESH N/A 05/12/2015   Procedure: INSERTION OF MESH;  Surgeon: Ralene Ok, MD;  Location: [place];  Service: General;  Laterality: N/A;   KNEE ARTHROPLASTY Right 09/11/1973   right knee reconstruction hign [id number]   KNEE ARTHROSCOPY Right    x3   REVERSE SHOULDER ARTHROPLASTY Right 01/24/2022   Procedure: RIGHT REVERSE SHOULDER ARTHROPLASTY;  Surgeon: Meredith Pel, MD;  Location: [place];  Service: Orthopedics;  Laterality: Right;   STERIOD INJECTION  08/27/2012   Procedure: STEROID INJECTION;  Surgeon: Roseanne Kaufman, MD;  Location: [place];  Service: Orthopedics;  Laterality: Left;   TOTAL HIP ARTHROPLASTY  09/11/2010   left   TOTAL KNEE ARTHROPLASTY  09/11/2005   rt   TOTAL KNEE REVISION  18/84/1660   rt   UMBILICAL HERNIA REPAIR N/A 05/12/2015   Procedure: LAPAROSCOPIC UMBILICAL HERNIA REPAIR;  Surgeon: Ralene Ok, MD;  Location: [place];  Service: General;  Laterality: N/A;   WRIST ARTHROSCOPY  09/11/2004   debrid-rt   Patient Active Problem List   Diagnosis Date Noted   Arthritis of right shoulder region    S/P reverse total shoulder arthroplasty, right 63/09/6008   Umbilical hernia 93/23/5573   Hematoma, postoperative 09/07/2012   Chronic anticoagulation 09/07/2012   GOUT 10/25/2007   History of pulmonary embolism 10/25/2007   DIARRHEA 10/25/2007   PERSONAL HISTORY OF ARTHRITIS 10/25/2007  PCP: Dr Domenick Gong    REFERRING PROVIDER: Dr Meredith Pel    REFERRING DIAG: Right Reverse Total Shoulder    THERAPY DIAG:  No diagnosis found.   Rationale for Evaluation and Treatment Rehabilitation   ONSET DATE: 01/24/2022   SUBJECTIVE:  SUBJECTIVE STATEMENT: Patient presents today following feeling a pop while lifting his dog approx 1.5 weeks ago. He had an MD visit yesterday who attributes it to supraspinatus. Advised to continue PT as tolerated. Pt says he still feels good; feels he has improved motion and has decreased pain.   PERTINENT HISTORY: LBP, Carpal tunnel, DJD, PE's Low back surgery,    PAIN:  Are you having pain? Yes: NPRS scale: 3/10 at worst  Pain location: lateral shoulder  Pain description: aching  Aggravating factors: steri strips pull at times  Relieving factors: rest  PRECAUTIONS: None and Shoulder TSA precautions    WEIGHT BEARING RESTRICTIONS  None    FALLS:  Has patient fallen in last  6 months? No   LIVING ENVIRONMENT: Nothing pertinent  OCCUPATION: Retired   Hobbies: basketball    PLOF: Independent   PATIENT GOALS    To improve functional use of his shoulder.      OBJECTIVE:                TODAY'S TREATMENT:    6/29 Pulleys warm-up - 67mn PROM flexion and ER with grade I/II AP and inf mobs Wand press - 2x10 1lb Supine active flexion - 1x15 Wand AAROM ER - 1x15 1lb Shoulder row - 2x10, green band Shoulder extension - 2x10, red band Flex/IR/ER/ext wall isometrics - 2x10, 3 sec hold   6/19 Pulleys warm-up - 2372m PROM flexion and ER with grade I/II AP and inf mobs Wand AAROM flexion - 1x10 1lb, 1lb dumbbell Flex/IR/ER/ext wall isometrics - 2x10, 3 sec hold Shoulder row - 2x10, green band Shoulder extension - 2x10, yellow band   6/14 Pulleys warm-up - 72m33mPROM flexion and ER with grade I/II AP and inf mobs Wand press - x10 1lb Wand AAROM flexion - 2x10 1lb Flex/IR/ER wall isometrics - 2x10, 3 sec hold Shoulder row, extension - x10, yellow band, cues for limiting extension     PATIENT EDUCATION: Education details: reviewed HEP; symptom management; progression of activity  Person educated: Patient Education method: Explanation, Demonstration, Tactile cues, Verbal cues, and Handouts Education comprehension: verbalized understanding, returned demonstration, and needs further education     HOME EXERCISE PROGRAM: Access Code [email or web address]  ASSESSMENT:   CLINICAL IMPRESSION: Patient has good improvements with range today but appears to be more sore and tender to palpation. He tolerates exercises well; says it feels less stiff following PROM, AAROM, and mobilizations. He has mild muscle soreness at end of session. He does note a bit more joint crepitus that corresponds with supraspinatus impingement noted by MD. He was reminded of activity/weight restrictions and monitoring shoulder rotation to be in neutral. He will continue to benefit from  skilled therapy to address impairments and continue return to function.     OBJECTIVE IMPAIRMENTS decreased mobility, decreased ROM, decreased strength, impaired UE functional use, and pain.    ACTIVITY LIMITATIONS carrying, lifting, bathing, dressing, reach over head, and hygiene/grooming   PARTICIPATION LIMITATIONS: meal prep, cleaning, driving, and yard work   PERSONAL FACTORS 3+ comorbidities: Low Back DDD; Thyroid disorder; right carpal tunnel release  are also affecting patient's functional outcome.    REHAB POTENTIAL: Excellent   CLINICAL DECISION MAKING: Stable/uncomplicated   EVALUATION COMPLEXITY: Low     GOALS: Goals reviewed with patient? Yes   SHORT TERM GOALS: Target date: 03/20/2022    Patient will demonstrate 45 degrees of active right shoulder ER  Baseline: Goal status: INITIAL   2.  Patient will be  demonstrate 90 degrees of active right shoulder flexion  Baseline:  Goal status: INITIAL   3.  Patient will be independent with basic HEP  Baseline:  Goal status: INITIAL   LONG TERM GOALS: Target date: 04/17/2022  Patient will use his right arm for all ADL's  Baseline:  Goal status: INITIAL   2.  Patient will return to drumming  Baseline:  Goal status: INITIAL   3.  Patient will be independent with a full exercise program  Baseline:  Goal status: INITIAL     PLAN: PT FREQUENCY: 2x/week   PT DURATION: 8 weeks   PLANNED INTERVENTIONS: Therapeutic exercises, Therapeutic activity, Neuromuscular re-education, Balance training, Gait training, Patient/Family education, Joint mobilization, Aquatic Therapy, Dry Needling, Cryotherapy, Moist heat, Taping, Ultrasound, and Manual therapy   PLAN FOR NEXT SESSION:  Continue PROM, AAROM, isometrics, and stability exercise per protocol.     Lenell Antu, Student-PT 03/09/2022, 9:01 AM   Carney Living, PT 03/09/2022, 10:27 AM   During this treatment session, the therapist was present, participating in and  directing the treatment.

## 2022-03-15 ENCOUNTER — Ambulatory Visit (HOSPITAL_BASED_OUTPATIENT_CLINIC_OR_DEPARTMENT_OTHER): Payer: 59 | Admitting: Physical Therapy

## 2022-03-16 ENCOUNTER — Ambulatory Visit (HOSPITAL_BASED_OUTPATIENT_CLINIC_OR_DEPARTMENT_OTHER): Payer: 59 | Attending: Orthopedic Surgery | Admitting: Physical Therapy

## 2022-03-16 ENCOUNTER — Encounter (HOSPITAL_BASED_OUTPATIENT_CLINIC_OR_DEPARTMENT_OTHER): Payer: Self-pay | Admitting: Physical Therapy

## 2022-03-16 DIAGNOSIS — M6281 Muscle weakness (generalized): Secondary | ICD-10-CM | POA: Insufficient documentation

## 2022-03-16 DIAGNOSIS — G8929 Other chronic pain: Secondary | ICD-10-CM | POA: Insufficient documentation

## 2022-03-16 DIAGNOSIS — M25611 Stiffness of right shoulder, not elsewhere classified: Secondary | ICD-10-CM | POA: Diagnosis not present

## 2022-03-16 DIAGNOSIS — M25511 Pain in right shoulder: Secondary | ICD-10-CM | POA: Diagnosis not present

## 2022-03-16 NOTE — Therapy (Signed)
OUTPATIENT PHYSICAL THERAPY SHOULDER EVALUATION   Patient Name: Jason Norton MRN: [id number] DOB:Aug 07, 1956, 66 y.o., male Today's Date: 03/16/2022   PT End of Session - 03/16/22 0929     Visit Number 5    Number of Visits 16    Date for PT Re-Evaluation 04/17/22    Authorization Type [facility]    PT Start Time 0845    PT Stop Time 0926    PT Time Calculation (min) 41 min    Activity Tolerance Patient tolerated treatment well    Behavior During Therapy [facility] for tasks assessed/performed               Past Medical History:  Diagnosis Date   Carpal tunnel syndrome    Clotting disorder (HCC)    Diverticulosis    DJD (degenerative joint disease)    Gout    Hyperlipemia    Hypothyroidism    Meningitis    at age 11 weeks old   PE (pulmonary embolism) 09/12/2003   hx post freq [id number]   Umbilical hernia    Past Surgical History:  Procedure Laterality Date   BACK SURGERY     lumb lam   CARPAL TUNNEL RELEASE  08/27/2012   Procedure: CARPAL TUNNEL RELEASE;  Surgeon: Roseanne Kaufman, MD;  Location: [place];  Service: Orthopedics;  Laterality: Right;  Right Limited Open Carpal Tunnel Release    COLONOSCOPY     I & D EXTREMITY  09/07/2012   Procedure: IRRIGATION AND DEBRIDEMENT EXTREMITY;  Surgeon: Linna Hoff, MD;  Location: [place];  Service: Orthopedics;  Laterality: Right;   INSERTION OF MESH N/A 05/12/2015   Procedure: INSERTION OF MESH;  Surgeon: Ralene Ok, MD;  Location: [place];  Service: General;  Laterality: N/A;   KNEE ARTHROPLASTY Right 09/11/1973   right knee reconstruction hign [id number]   KNEE ARTHROSCOPY Right    x3   REVERSE SHOULDER ARTHROPLASTY Right 01/24/2022   Procedure: RIGHT REVERSE SHOULDER ARTHROPLASTY;  Surgeon: Meredith Pel, MD;  Location: [place];  Service: Orthopedics;  Laterality: Right;   STERIOD INJECTION  08/27/2012   Procedure: STEROID INJECTION;  Surgeon: Roseanne Kaufman, MD;  Location: [place];  Service: Orthopedics;  Laterality: Left;   TOTAL HIP ARTHROPLASTY  09/11/2010   left   TOTAL KNEE ARTHROPLASTY  09/11/2005   rt   TOTAL KNEE REVISION  84/69/6295   rt   UMBILICAL HERNIA REPAIR N/A 05/12/2015   Procedure: LAPAROSCOPIC UMBILICAL HERNIA REPAIR;  Surgeon: Ralene Ok, MD;  Location: [place];  Service: General;  Laterality: N/A;   WRIST ARTHROSCOPY  09/11/2004   debrid-rt   Patient Active Problem List   Diagnosis Date Noted   Arthritis of right shoulder region    S/P reverse total shoulder arthroplasty, right 28/41/3244   Umbilical hernia 09/13/7251   Hematoma, postoperative 09/07/2012   Chronic anticoagulation 09/07/2012   GOUT 10/25/2007   History of pulmonary embolism 10/25/2007   DIARRHEA 10/25/2007   PERSONAL HISTORY OF ARTHRITIS 10/25/2007  PCP: Dr Domenick Gong    REFERRING PROVIDER: Dr Meredith Pel    REFERRING DIAG: Right Reverse Total Shoulder    THERAPY DIAG:  No diagnosis found.   Rationale for Evaluation and Treatment Rehabilitation   ONSET DATE: 01/24/2022   SUBJECTIVE:  SUBJECTIVE STATEMENT: Pt states that the shoulder felt good today. He states he has not had pain since last session.    PERTINENT HISTORY: LBP, Carpal tunnel, DJD, PE's Low back surgery,    PAIN:  Are you having pain? No: NPRS scale: 0/10 at worst  Pain location: lateral shoulder  Pain description: aching  Aggravating factors: steri strips pull at times  Relieving factors: rest  PRECAUTIONS: None and Shoulder TSA precautions    WEIGHT BEARING RESTRICTIONS  None    FALLS:  Has patient fallen in last 6 months? No   LIVING ENVIRONMENT: Nothing pertinent  OCCUPATION: Retired   Hobbies: basketball    PLOF: Independent   PATIENT GOALS    To improve functional use of  his shoulder.      OBJECTIVE:                TODAY'S TREATMENT:   7/6 Pulleys flexion and ABD- 760mn PROM flexion and ER with grade I/II AP and inf mobs STM R deltoid  Single arm supine press - 2x8 S/L ABD elbow bent 2x10 Supine active flexion - 1x15 Shoulder row - 2x10, green band IR BHB stretch to waist 2s 10x Standing AROM IR/ER 2x10 bilat   6/29 Pulleys warm-up - 250m PROM flexion and ER with grade I/II AP and inf mobs Wand press - 2x10 1lb Supine active flexion - 1x15 Wand AAROM ER - 1x15 1lb Shoulder row - 2x10, green band Shoulder extension - 2x10, red band Flex/IR/ER/ext wall isometrics - 2x10, 3 sec hold   6/19 Pulleys warm-up - 60m13mPROM flexion and ER with grade I/II AP and inf mobs Wand AAROM flexion - 1x10 1lb, 1lb dumbbell Flex/IR/ER/ext wall isometrics - 2x10, 3 sec hold Shoulder row - 2x10, green band Shoulder extension - 2x10, yellow band   6/14 Pulleys warm-up - 60mi33mROM flexion and ER with grade I/II AP and inf mobs Wand press - x10 1lb Wand AAROM flexion - 2x10 1lb Flex/IR/ER wall isometrics - 2x10, 3 sec hold Shoulder row, extension - x10, yellow band, cues for limiting extension     PATIENT EDUCATION: Education details: reviewed HEP; symptom management; progression of activity  Person educated: Patient Education method: Explanation, Demonstration, Tactile cues, Verbal cues, and Handouts Education comprehension: verbalized understanding, returned demonstration, and needs further education     HOME EXERCISE PROGRAM: Access Code [email or web address] ASSESSMENT:   CLINICAL IMPRESSION: Pt able to continue with light resistance and AROM today without significant increase in pain. Pt with hypertonicity of R deltoid that recreated sharp pain with flexion but was relieved with with STM. Pt likely to need repeat of STM if he continues to have pain with overuse type irritation. Plan to continue with AROM and light stretching as tolerated. Pt will  continue to benefit from skilled therapy to address impairments and continue return to function.     OBJECTIVE IMPAIRMENTS decreased mobility, decreased ROM, decreased strength, impaired UE functional use, and pain.    ACTIVITY LIMITATIONS carrying, lifting, bathing, dressing, reach over head, and hygiene/grooming   PARTICIPATION LIMITATIONS: meal prep, cleaning, driving, and yard work   PERSONAL FACTORS 3+ comorbidities: Low Back DDD; Thyroid disorder; right carpal tunnel release  are also affecting patient's functional outcome.    REHAB POTENTIAL: Excellent   CLINICAL DECISION MAKING: Stable/uncomplicated   EVALUATION COMPLEXITY: Low     GOALS: Goals reviewed with patient? Yes   SHORT TERM GOALS: Target date: 03/20/2022    Patient will demonstrate 45  degrees of active right shoulder ER  Baseline: Goal status: INITIAL   2.  Patient will be demonstrate 90 degrees of active right shoulder flexion  Baseline:  Goal status: INITIAL   3.  Patient will be independent with basic HEP  Baseline:  Goal status: INITIAL   LONG TERM GOALS: Target date: 04/17/2022  Patient will use his right arm for all ADL's  Baseline:  Goal status: INITIAL   2.  Patient will return to drumming  Baseline:  Goal status: INITIAL   3.  Patient will be independent with a full exercise program  Baseline:  Goal status: INITIAL     PLAN: PT FREQUENCY: 2x/week   PT DURATION: 8 weeks   PLANNED INTERVENTIONS: Therapeutic exercises, Therapeutic activity, Neuromuscular re-education, Balance training, Gait training, Patient/Family education, Joint mobilization, Aquatic Therapy, Dry Needling, Cryotherapy, Moist heat, Taping, Ultrasound, and Manual therapy   PLAN FOR NEXT SESSION:  Continue PROM, AAROM, isometrics, and stability exercise per protocol.    Daleen Bo, PT 03/16/2022, 9:29 AM

## 2022-03-21 ENCOUNTER — Ambulatory Visit (HOSPITAL_BASED_OUTPATIENT_CLINIC_OR_DEPARTMENT_OTHER): Payer: 59 | Admitting: Physical Therapy

## 2022-03-21 ENCOUNTER — Encounter (HOSPITAL_BASED_OUTPATIENT_CLINIC_OR_DEPARTMENT_OTHER): Payer: 59 | Admitting: Physical Therapy

## 2022-03-21 ENCOUNTER — Encounter (HOSPITAL_BASED_OUTPATIENT_CLINIC_OR_DEPARTMENT_OTHER): Payer: Self-pay | Admitting: Physical Therapy

## 2022-03-21 DIAGNOSIS — G8929 Other chronic pain: Secondary | ICD-10-CM

## 2022-03-21 DIAGNOSIS — M6281 Muscle weakness (generalized): Secondary | ICD-10-CM

## 2022-03-21 DIAGNOSIS — M25611 Stiffness of right shoulder, not elsewhere classified: Secondary | ICD-10-CM | POA: Diagnosis not present

## 2022-03-21 DIAGNOSIS — M25511 Pain in right shoulder: Secondary | ICD-10-CM | POA: Diagnosis not present

## 2022-03-21 NOTE — Therapy (Signed)
OUTPATIENT PHYSICAL THERAPY SHOULDER TREATMENT   Patient Name: Jason Norton MRN: [id number] DOB:12-Oct-1955, 66 y.o., male Today's Date: 03/21/2022   PT End of Session - 03/21/22 1044     Visit Number 6    Number of Visits 16    Date for PT Re-Evaluation 04/17/22    Authorization Type [facility]    PT Start Time 1015    PT Stop Time 1055    PT Time Calculation (min) 40 min    Activity Tolerance Patient tolerated treatment well    Behavior During Therapy WFL for tasks assessed/performed                Past Medical History:  Diagnosis Date   Carpal tunnel syndrome    Clotting disorder (HCC)    Diverticulosis    DJD (degenerative joint disease)    Gout    Hyperlipemia    Hypothyroidism    Meningitis    at age 29 weeks old   PE (pulmonary embolism) 09/12/2003   hx post freq [id number]   Umbilical hernia    Past Surgical History:  Procedure Laterality Date   BACK SURGERY     lumb lam   CARPAL TUNNEL RELEASE  08/27/2012   Procedure: CARPAL TUNNEL RELEASE;  Surgeon: Roseanne Kaufman, MD;  Location: [place];  Service: Orthopedics;  Laterality: Right;  Right Limited Open Carpal Tunnel Release    COLONOSCOPY     I & D EXTREMITY  09/07/2012   Procedure: IRRIGATION AND DEBRIDEMENT EXTREMITY;  Surgeon: Linna Hoff, MD;  Location: [place];  Service: Orthopedics;  Laterality: Right;   INSERTION OF MESH N/A 05/12/2015   Procedure: INSERTION OF MESH;  Surgeon: Ralene Ok, MD;  Location: [place];  Service: General;  Laterality: N/A;   KNEE ARTHROPLASTY Right 09/11/1973   right knee reconstruction hign [id number]   KNEE ARTHROSCOPY Right    x3   REVERSE SHOULDER ARTHROPLASTY Right 01/24/2022   Procedure: RIGHT REVERSE SHOULDER ARTHROPLASTY;  Surgeon: Meredith Pel, MD;  Location: [place];  Service: Orthopedics;  Laterality: Right;   STERIOD INJECTION  08/27/2012   Procedure: STEROID INJECTION;  Surgeon: Roseanne Kaufman, MD;  Location: [place];  Service: Orthopedics;  Laterality: Left;   TOTAL HIP ARTHROPLASTY  09/11/2010   left   TOTAL KNEE ARTHROPLASTY  09/11/2005   rt   TOTAL KNEE REVISION  28/31/5176   rt   UMBILICAL HERNIA REPAIR N/A 05/12/2015   Procedure: LAPAROSCOPIC UMBILICAL HERNIA REPAIR;  Surgeon: Ralene Ok, MD;  Location: [place];  Service: General;  Laterality: N/A;   WRIST ARTHROSCOPY  09/11/2004   debrid-rt   Patient Active Problem List   Diagnosis Date Noted   Arthritis of right shoulder region    S/P reverse total shoulder arthroplasty, right 16/03/3709   Umbilical hernia 62/69/4854   Hematoma, postoperative 09/07/2012   Chronic anticoagulation 09/07/2012   GOUT 10/25/2007   History of pulmonary embolism 10/25/2007   DIARRHEA 10/25/2007   PERSONAL HISTORY OF ARTHRITIS 10/25/2007  PCP: Dr Domenick Gong    REFERRING PROVIDER: Dr Meredith Pel    REFERRING DIAG: Right Reverse Total Shoulder    THERAPY DIAG:  No diagnosis found.   Rationale for Evaluation and Treatment Rehabilitation   ONSET DATE: 01/24/2022   SUBJECTIVE:  SUBJECTIVE STATEMENT: Pt states he had no issues after last session. He feels his back muscle cramping with rowing. Feels minor discomfort in to the tricep today.    PERTINENT HISTORY: LBP, Carpal tunnel, DJD, PE's Low back surgery,    PAIN:  Are you having pain? No: NPRS scale: 0/10 at worst  Pain location: lateral shoulder  Pain description: aching  Aggravating factors: steri strips pull at times  Relieving factors: rest  PRECAUTIONS: None and Shoulder TSA precautions    WEIGHT BEARING RESTRICTIONS  None    FALLS:  Has patient fallen in last 6 months? No   LIVING ENVIRONMENT: Nothing pertinent  OCCUPATION: Retired   Hobbies: basketball    PLOF: Independent    PATIENT GOALS    To improve functional use of his shoulder.      OBJECTIVE:                TODAY'S TREATMENT:   7/11 Pulleys flexion and ABD- 60min PROM flexion and ER with grade I/II AP and inf mobs STM R triceps and infra  Single arm supine press - 2x10 Supine rhythmic stabilizations 4x 2s Supine protraction 2x10 S/L ABD elbow bent 2x10 S/L ER very small ROM 2x10 Supine active flexion - 2x10 Bent over row 2lbs 2x10 Standing AROM IR/ER 2x10 bilat  7/6 Pulleys flexion and ABD- 15min PROM flexion and ER with grade I/II AP and inf mobs STM R deltoid  Single arm supine press - 2x8 S/L ABD elbow bent 2x10 Supine active flexion - 1x15 Shoulder row - 2x10, green band IR BHB stretch to waist 2s 10x Standing AROM IR/ER 2x10 bilat   6/29 Pulleys warm-up - 77min PROM flexion and ER with grade I/II AP and inf mobs Wand press - 2x10 1lb Supine active flexion - 1x15 Wand AAROM ER - 1x15 1lb Shoulder row - 2x10, green band Shoulder extension - 2x10, red band Flex/IR/ER/ext wall isometrics - 2x10, 3 sec hold   6/19 Pulleys warm-up - 33min PROM flexion and ER with grade I/II AP and inf mobs Wand AAROM flexion - 1x10 1lb, 1lb dumbbell Flex/IR/ER/ext wall isometrics - 2x10, 3 sec hold Shoulder row - 2x10, green band Shoulder extension - 2x10, yellow band   6/14 Pulleys warm-up - 44min PROM flexion and ER with grade I/II AP and inf mobs Wand press - x10 1lb Wand AAROM flexion - 2x10 1lb Flex/IR/ER wall isometrics - 2x10, 3 sec hold Shoulder row, extension - x10, yellow band, cues for limiting extension     PATIENT EDUCATION: Education details: reviewed HEP; symptom management; progression of activity  Person educated: Patient Education method: Explanation, Demonstration, Tactile cues, Verbal cues, and Handouts Education comprehension: verbalized understanding, returned demonstration, and needs further education     HOME EXERCISE PROGRAM: Access Code [email or web address]      ASSESSMENT:   CLINICAL IMPRESSION: Pt presents with R triceps and infra TTP that is likely due to introduction of light rowing exercise and ER for HEP. Pt had improvement in ROM following PROM and joint mobilizations. Pt with mild discomfort with flexion based movement but able to introduce rhythmic stabilizations and AROM ER without issue. Plan to continue with AROM and light stretching as tolerated. Pt will continue to benefit from skilled therapy to address impairments and continue return to function.     OBJECTIVE IMPAIRMENTS decreased mobility, decreased ROM, decreased strength, impaired UE functional use, and pain.    ACTIVITY LIMITATIONS carrying, lifting, bathing, dressing, reach over head, and hygiene/grooming   PARTICIPATION  LIMITATIONS: meal prep, cleaning, driving, and yard work   PERSONAL FACTORS 3+ comorbidities: Low Back DDD; Thyroid disorder; right carpal tunnel release  are also affecting patient's functional outcome.    REHAB POTENTIAL: Excellent   CLINICAL DECISION MAKING: Stable/uncomplicated   EVALUATION COMPLEXITY: Low     GOALS: Goals reviewed with patient? Yes   SHORT TERM GOALS: Target date: 03/20/2022    Patient will demonstrate 45 degrees of active right shoulder ER  Baseline: Goal status: MET   2.  Patient will be demonstrate 90 degrees of active right shoulder flexion  Baseline:  Goal status: MET   3.  Patient will be independent with basic HEP  Baseline:  Goal status: MET   LONG TERM GOALS: Target date: 04/17/2022  Patient will use his right arm for all ADL's  Baseline:  Goal status: ongoing   2.  Patient will return to drumming  Baseline:  Goal status: ongoing   3.  Patient will be independent with a full exercise program  Baseline:  Goal status: ongoing     PLAN: PT FREQUENCY: 2x/week   PT DURATION: 8 weeks   PLANNED INTERVENTIONS: Therapeutic exercises, Therapeutic activity, Neuromuscular re-education, Balance training, Gait  training, Patient/Family education, Joint mobilization, Aquatic Therapy, Dry Needling, Cryotherapy, Moist heat, Taping, Ultrasound, and Manual therapy   PLAN FOR NEXT SESSION:  Continue PROM, AAROM, isometrics, and stability exercise per protocol.    Daleen Bo, PT 03/21/2022, 11:12 AM

## 2022-03-22 ENCOUNTER — Encounter (HOSPITAL_BASED_OUTPATIENT_CLINIC_OR_DEPARTMENT_OTHER): Payer: 59 | Admitting: Physical Therapy

## 2022-03-28 ENCOUNTER — Encounter (HOSPITAL_BASED_OUTPATIENT_CLINIC_OR_DEPARTMENT_OTHER): Payer: Self-pay | Admitting: Physical Therapy

## 2022-03-28 ENCOUNTER — Ambulatory Visit (HOSPITAL_BASED_OUTPATIENT_CLINIC_OR_DEPARTMENT_OTHER): Payer: 59 | Admitting: Physical Therapy

## 2022-03-28 DIAGNOSIS — M6281 Muscle weakness (generalized): Secondary | ICD-10-CM

## 2022-03-28 DIAGNOSIS — M25511 Pain in right shoulder: Secondary | ICD-10-CM | POA: Diagnosis not present

## 2022-03-28 DIAGNOSIS — M25611 Stiffness of right shoulder, not elsewhere classified: Secondary | ICD-10-CM | POA: Diagnosis not present

## 2022-03-28 DIAGNOSIS — G8929 Other chronic pain: Secondary | ICD-10-CM

## 2022-03-28 NOTE — Therapy (Signed)
OUTPATIENT PHYSICAL THERAPY SHOULDER TREATMENT   Patient Name: Jason Norton MRN: [id number] DOB:05/18/1956, 66 y.o., male Today's Date: 03/28/2022   PT End of Session - 03/28/22 1352     Visit Number 7    Number of Visits 16    Date for PT Re-Evaluation 04/17/22    Authorization Type [facility]    PT Start Time 1300    PT Stop Time 1342    PT Time Calculation (min) 42 min    Activity Tolerance Patient tolerated treatment well    Behavior During Therapy WFL for tasks assessed/performed                 Past Medical History:  Diagnosis Date   Carpal tunnel syndrome    Clotting disorder (HCC)    Diverticulosis    DJD (degenerative joint disease)    Gout    Hyperlipemia    Hypothyroidism    Meningitis    at age 84 weeks old   PE (pulmonary embolism) 09/12/2003   hx post freq [id number]   Umbilical hernia    Past Surgical History:  Procedure Laterality Date   BACK SURGERY     lumb lam   CARPAL TUNNEL RELEASE  08/27/2012   Procedure: CARPAL TUNNEL RELEASE;  Surgeon: Roseanne Kaufman, MD;  Location: [place];  Service: Orthopedics;  Laterality: Right;  Right Limited Open Carpal Tunnel Release    COLONOSCOPY     I & D EXTREMITY  09/07/2012   Procedure: IRRIGATION AND DEBRIDEMENT EXTREMITY;  Surgeon: Linna Hoff, MD;  Location: [place];  Service: Orthopedics;  Laterality: Right;   INSERTION OF MESH N/A 05/12/2015   Procedure: INSERTION OF MESH;  Surgeon: Ralene Ok, MD;  Location: [place];  Service: General;  Laterality: N/A;   KNEE ARTHROPLASTY Right 09/11/1973   right knee reconstruction hign [id number]   KNEE ARTHROSCOPY Right    x3   REVERSE SHOULDER ARTHROPLASTY Right 01/24/2022   Procedure: RIGHT REVERSE SHOULDER ARTHROPLASTY;  Surgeon: Meredith Pel, MD;  Location: [place];  Service: Orthopedics;  Laterality: Right;   STERIOD INJECTION  08/27/2012   Procedure: STEROID INJECTION;  Surgeon: Roseanne Kaufman, MD;  Location: [place];  Service: Orthopedics;  Laterality: Left;   TOTAL HIP ARTHROPLASTY  09/11/2010   left   TOTAL KNEE ARTHROPLASTY  09/11/2005   rt   TOTAL KNEE REVISION  16/09/930   rt   UMBILICAL HERNIA REPAIR N/A 05/12/2015   Procedure: LAPAROSCOPIC UMBILICAL HERNIA REPAIR;  Surgeon: Ralene Ok, MD;  Location: [place];  Service: General;  Laterality: N/A;   WRIST ARTHROSCOPY  09/11/2004   debrid-rt   Patient Active Problem List   Diagnosis Date Noted   Arthritis of right shoulder region    S/P reverse total shoulder arthroplasty, right 35/57/3220   Umbilical hernia 25/42/7062   Hematoma, postoperative 09/07/2012   Chronic anticoagulation 09/07/2012   GOUT 10/25/2007   History of pulmonary embolism 10/25/2007   DIARRHEA 10/25/2007   PERSONAL HISTORY OF ARTHRITIS 10/25/2007  PCP: Dr Domenick Gong    REFERRING PROVIDER: Dr Meredith Pel    REFERRING DIAG: Right Reverse Total Shoulder    THERAPY DIAG:  No diagnosis found.   Rationale for Evaluation and Treatment Rehabilitation   ONSET DATE: 01/24/2022   SUBJECTIVE:  SUBJECTIVE STATEMENT: Pt states he had no issues after last session. He states that the arm is sore into the triceps and biceps. [place] on the roof. He has been using the arm more recently.    PERTINENT HISTORY: LBP, Carpal tunnel, DJD, PE's Low back surgery,    PAIN:  Are you having pain? Yes: NPRS scale: no number provided, "it is really sore and I definitely feel it."  Pain location: lateral shoulder  Pain description: aching  Aggravating factors: steri strips pull at times  Relieving factors: rest  PRECAUTIONS: None and Shoulder TSA precautions    WEIGHT BEARING RESTRICTIONS  None    FALLS:  Has patient  fallen in last 6 months? No   LIVING ENVIRONMENT: Nothing pertinent  OCCUPATION: Retired   Hobbies: basketball    PLOF: Independent   PATIENT GOALS    To improve functional use of his shoulder.      OBJECTIVE:                TODAY'S TREATMENT:   7/18  PROM flexion and ER with grade III AP and superior mobs  STM R deltoid, triceps, and infra  Single arm supine press - 2x10 1lb Supine rhythmic stabilizations 4x 20s Supine protraction 2x10 Seated ABD elbow bent 2x10   On hold today:  S/L ER very small ROM 2x10 Supine active flexion - 2x10 Bent over row 2lbs 2x10 Standing AROM IR/ER 2x10 bilat  7/11 Pulleys flexion and ABD- 79min PROM flexion and ER with grade I/II AP and inf mobs STM R triceps and infra  Single arm supine press - 2x10 Supine rhythmic stabilizations 4x 2s Supine protraction 2x10 S/L ABD elbow bent 2x10 S/L ER very small ROM 2x10 Supine active flexion - 2x10 Bent over row 2lbs 2x10 Standing AROM IR/ER 2x10 bilat  7/6 Pulleys flexion and ABD- 35min PROM flexion and ER with grade I/II AP and inf mobs STM R deltoid  Single arm supine press - 2x8 S/L ABD elbow bent 2x10 Supine active flexion - 1x15 Shoulder row - 2x10, green band IR BHB stretch to waist 2s 10x Standing AROM IR/ER 2x10 bilat   6/29 Pulleys warm-up - 25min PROM flexion and ER with grade I/II AP and inf mobs Wand press - 2x10 1lb Supine active flexion - 1x15 Wand AAROM ER - 1x15 1lb Shoulder row - 2x10, green band Shoulder extension - 2x10, red band Flex/IR/ER/ext wall isometrics - 2x10, 3 sec hold   6/19 Pulleys warm-up - 64min PROM flexion and ER with grade I/II AP and inf mobs Wand AAROM flexion - 1x10 1lb, 1lb dumbbell Flex/IR/ER/ext wall isometrics - 2x10, 3 sec hold Shoulder row - 2x10, green band Shoulder extension - 2x10, yellow band   6/14 Pulleys warm-up - 72min PROM flexion and ER with grade I/II AP and inf mobs Wand press - x10 1lb Wand AAROM flexion -  2x10 1lb Flex/IR/ER wall isometrics - 2x10, 3 sec hold Shoulder row, extension - x10, yellow band, cues for limiting extension     PATIENT EDUCATION: Education details: reviewed HEP; symptom management; progression of activity  Person educated: Patient Education method: Explanation, Demonstration, Tactile cues, Verbal cues, and Handouts Education comprehension: verbalized understanding, returned demonstration, and needs further education     HOME EXERCISE PROGRAM: Access Code [email or web address]     ASSESSMENT:   CLINICAL IMPRESSION: Pt presents with continued periscapular and R UE pain and soreness at today's session that appears  consistent with overuse related irritation. Pt with pain with forward flexion and general pressing motion against gravity. Pt with trigger point related pain and hypertonicity throughout the R triceps, biceps, deltoid, and infraspinatus. Pt advised to reduce frequency of use for today and tomorrow. Plan to revisit and progress with strengthening as able at next appt on Thursday. Pt will continue to benefit from skilled therapy to address impairments and continue return to function.     OBJECTIVE IMPAIRMENTS decreased mobility, decreased ROM, decreased strength, impaired UE functional use, and pain.    ACTIVITY LIMITATIONS carrying, lifting, bathing, dressing, reach over head, and hygiene/grooming   PARTICIPATION LIMITATIONS: meal prep, cleaning, driving, and yard work   PERSONAL FACTORS 3+ comorbidities: Low Back DDD; Thyroid disorder; right carpal tunnel release  are also affecting patient's functional outcome.    REHAB POTENTIAL: Excellent   CLINICAL DECISION MAKING: Stable/uncomplicated   EVALUATION COMPLEXITY: Low     GOALS: Goals reviewed with patient? Yes   SHORT TERM GOALS: Target date: 03/20/2022    Patient will demonstrate 45 degrees of active right shoulder ER  Baseline: Goal status: MET   2.  Patient will be demonstrate 90 degrees of active  right shoulder flexion  Baseline:  Goal status: MET   3.  Patient will be independent with basic HEP  Baseline:  Goal status: MET   LONG TERM GOALS: Target date: 04/17/2022  Patient will use his right arm for all ADL's  Baseline:  Goal status: ongoing   2.  Patient will return to drumming  Baseline:  Goal status: ongoing   3.  Patient will be independent with a full exercise program  Baseline:  Goal status: ongoing     PLAN: PT FREQUENCY: 2x/week   PT DURATION: 8 weeks   PLANNED INTERVENTIONS: Therapeutic exercises, Therapeutic activity, Neuromuscular re-education, Balance training, Gait training, Patient/Family education, Joint mobilization, Aquatic Therapy, Dry Needling, Cryotherapy, Moist heat, Taping, Ultrasound, and Manual therapy   PLAN FOR NEXT SESSION:  Continue PROM, AAROM, isometrics, and stability exercise per protocol.    Daleen Bo, PT 03/28/2022, 2:16 PM

## 2022-03-30 ENCOUNTER — Other Ambulatory Visit (HOSPITAL_COMMUNITY): Payer: Self-pay

## 2022-03-30 ENCOUNTER — Ambulatory Visit (HOSPITAL_BASED_OUTPATIENT_CLINIC_OR_DEPARTMENT_OTHER): Payer: 59 | Admitting: Physical Therapy

## 2022-03-30 ENCOUNTER — Encounter (HOSPITAL_BASED_OUTPATIENT_CLINIC_OR_DEPARTMENT_OTHER): Payer: Self-pay | Admitting: Physical Therapy

## 2022-03-30 DIAGNOSIS — M25511 Pain in right shoulder: Secondary | ICD-10-CM | POA: Diagnosis not present

## 2022-03-30 DIAGNOSIS — M25611 Stiffness of right shoulder, not elsewhere classified: Secondary | ICD-10-CM

## 2022-03-30 DIAGNOSIS — M6281 Muscle weakness (generalized): Secondary | ICD-10-CM | POA: Diagnosis not present

## 2022-03-30 DIAGNOSIS — G8929 Other chronic pain: Secondary | ICD-10-CM | POA: Diagnosis not present

## 2022-03-30 NOTE — Therapy (Signed)
OUTPATIENT PHYSICAL THERAPY SHOULDER TREATMENT   Patient Name: Jason Norton MRN: [id number] DOB:Aug 13, 1956, 66 y.o., male Today's Date: 03/30/2022   PT End of Session - 03/30/22 0849     Visit Number 8    Number of Visits 16    Date for PT Re-Evaluation 04/17/22    Authorization Type [facility]    PT Start Time 0848    PT Stop Time 0928    PT Time Calculation (min) 40 min    Activity Tolerance Patient tolerated treatment well    Behavior During Therapy [facility] for tasks assessed/performed                  Past Medical History:  Diagnosis Date   Carpal tunnel syndrome    Clotting disorder (HCC)    Diverticulosis    DJD (degenerative joint disease)    Gout    Hyperlipemia    Hypothyroidism    Meningitis    at age 3 weeks old   PE (pulmonary embolism) 09/12/2003   hx post freq [id number]   Umbilical hernia    Past Surgical History:  Procedure Laterality Date   BACK SURGERY     lumb lam   CARPAL TUNNEL RELEASE  08/27/2012   Procedure: CARPAL TUNNEL RELEASE;  Surgeon: Roseanne Kaufman, MD;  Location: [place];  Service: Orthopedics;  Laterality: Right;  Right Limited Open Carpal Tunnel Release    COLONOSCOPY     I & D EXTREMITY  09/07/2012   Procedure: IRRIGATION AND DEBRIDEMENT EXTREMITY;  Surgeon: Linna Hoff, MD;  Location: [place];  Service: Orthopedics;  Laterality: Right;   INSERTION OF MESH N/A 05/12/2015   Procedure: INSERTION OF MESH;  Surgeon: Ralene Ok, MD;  Location: [place];  Service: General;  Laterality: N/A;   KNEE ARTHROPLASTY Right 09/11/1973   right knee reconstruction hign [id number]   KNEE ARTHROSCOPY Right    x3   REVERSE SHOULDER ARTHROPLASTY Right 01/24/2022   Procedure: RIGHT REVERSE SHOULDER ARTHROPLASTY;  Surgeon: Meredith Pel, MD;  Location: [place];  Service: Orthopedics;  Laterality: Right;   STERIOD INJECTION  08/27/2012   Procedure: STEROID INJECTION;  Surgeon: Roseanne Kaufman, MD;  Location: [place];  Service: Orthopedics;  Laterality: Left;   TOTAL HIP ARTHROPLASTY  09/11/2010   left   TOTAL KNEE ARTHROPLASTY  09/11/2005   rt   TOTAL KNEE REVISION  29/47/6546   rt   UMBILICAL HERNIA REPAIR N/A 05/12/2015   Procedure: LAPAROSCOPIC UMBILICAL HERNIA REPAIR;  Surgeon: Ralene Ok, MD;  Location: [place];  Service: General;  Laterality: N/A;   WRIST ARTHROSCOPY  09/11/2004   debrid-rt   Patient Active Problem List   Diagnosis Date Noted   Arthritis of right shoulder region    S/P reverse total shoulder arthroplasty, right 50/35/4656   Umbilical hernia 81/27/5170   Hematoma, postoperative 09/07/2012   Chronic anticoagulation 09/07/2012   GOUT 10/25/2007   History of pulmonary embolism 10/25/2007   DIARRHEA 10/25/2007   PERSONAL HISTORY OF ARTHRITIS 10/25/2007  PCP: Dr Domenick Gong    REFERRING PROVIDER: Dr Meredith Pel    REFERRING DIAG: Right Reverse Total Shoulder    THERAPY DIAG:  No diagnosis found.   Rationale for Evaluation and Treatment Rehabilitation   ONSET DATE: 01/24/2022   SUBJECTIVE:  SUBJECTIVE STATEMENT: Pt states taking it easier the last few days has reduced soreness. It is just tight this morning.    PERTINENT HISTORY: LBP, Carpal tunnel, DJD, PE's Low back surgery,    PAIN:  Are you having pain? No: NPRS scale: 0/10 Pain location: lateral shoulder  Pain description: aching  Aggravating factors: steri strips pull at times  Relieving factors: rest  PRECAUTIONS: None and Shoulder TSA precautions    WEIGHT BEARING RESTRICTIONS  None    FALLS:  Has patient fallen in last 6 months? No   LIVING ENVIRONMENT: Nothing pertinent  OCCUPATION: Retired   Hobbies: basketball    PLOF: Independent   PATIENT GOALS    To improve functional use of  his shoulder.      OBJECTIVE:                TODAY'S TREATMENT:   7/20  Pulleys flexion and ABD- 94min PROM flexion and ER with grade III AP and superior  mobs  Single arm supine press - 2x10 Wall ball 2x10 CW CCW and up down Wall  protraction (hands off wall) hands moving to the wall 2x10 Standing wall biceps stretch 30s 3x Standing ABD elbow bent 2x10 (focus on reduction of UT)  Standing YTB ER 2x10 Supine active flexion - 2x10 (at mirror)  Bent over row 2lbs 2x10 Bent over T 2x10 no weight   7/18  PROM flexion and ER with grade III AP and superior mobs  STM R deltoid, triceps, and infra  Single arm supine press - 2x10 1lb Supine rhythmic stabilizations 4x 20s Supine protraction 2x10 Seated ABD elbow bent 2x10   On hold today:  S/L ER very small ROM 2x10 Supine active flexion - 2x10 Bent over row 2lbs 2x10 Standing AROM IR/ER 2x10 bilat  7/11 Pulleys flexion and ABD- 64min PROM flexion and ER with grade I/II AP and inf mobs STM R triceps and infra  Single arm supine press - 2x10 Supine rhythmic stabilizations 4x 2s Supine protraction 2x10 S/L ABD elbow bent 2x10 S/L ER very small ROM 2x10 Supine active flexion - 2x10 Bent over row 2lbs 2x10 Standing AROM IR/ER 2x10 bilat  7/6 Pulleys flexion and ABD- 15min PROM flexion and ER with grade I/II AP and inf mobs STM R deltoid  Single arm supine press - 2x8 S/L ABD elbow bent 2x10 Supine active flexion - 1x15 Shoulder row - 2x10, green band IR BHB stretch to waist 2s 10x Standing AROM IR/ER 2x10 bilat   6/29 Pulleys warm-up - 57min PROM flexion and ER with grade I/II AP and inf mobs Wand press - 2x10 1lb Supine active flexion - 1x15 Wand AAROM ER - 1x15 1lb Shoulder row - 2x10, green band Shoulder extension - 2x10, red band Flex/IR/ER/ext wall isometrics - 2x10, 3 sec hold   6/19 Pulleys warm-up - 50min PROM flexion and ER with grade I/II AP and inf mobs Wand AAROM flexion - 1x10 1lb, 1lb  dumbbell Flex/IR/ER/ext wall isometrics - 2x10, 3 sec hold Shoulder row - 2x10, green band Shoulder extension - 2x10, yellow band   6/14 Pulleys warm-up - 66min PROM flexion and ER with grade I/II AP and inf mobs Wand press - x10 1lb Wand AAROM flexion - 2x10 1lb Flex/IR/ER wall isometrics - 2x10, 3 sec hold Shoulder row, extension - x10, yellow band, cues for limiting extension     PATIENT EDUCATION: Education details: reviewed HEP; symptom management; progression of activity  Person educated: Patient Education method: Explanation, Demonstration,  Tactile cues, Verbal cues, and Handouts Education comprehension: verbalized understanding, returned demonstration, and needs further education     HOME EXERCISE PROGRAM: Access Code [email or web address]     ASSESSMENT:   CLINICAL IMPRESSION: Pt presents with decreased pain and soreness at today's session with improved ability to lift against gravity after period of reduced activity/ rest. Pt able to progress AROM exercise to standing with light resistance without pain. However, pt requires VC, TC, and visual cuing at mirror for reduction of UT compensation with flexion and ABD. Pt advised to emphasize shoulder setting for the next 2 weeks to prevent excessive scapular upward excursion with OH movements. Pt given edu about proper scapular elevation when reaching at later phases of rehab/return to PLOF. Continue with strength as tolerated at next. ROM is steadily improving per protocol. Pt will continue to benefit from skilled therapy to address impairments and continue return to function.     OBJECTIVE IMPAIRMENTS decreased mobility, decreased ROM, decreased strength, impaired UE functional use, and pain.    ACTIVITY LIMITATIONS carrying, lifting, bathing, dressing, reach over head, and hygiene/grooming   PARTICIPATION LIMITATIONS: meal prep, cleaning, driving, and yard work   PERSONAL FACTORS 3+ comorbidities: Low Back DDD; Thyroid disorder; right  carpal tunnel release  are also affecting patient's functional outcome.    REHAB POTENTIAL: Excellent   CLINICAL DECISION MAKING: Stable/uncomplicated   EVALUATION COMPLEXITY: Low     GOALS: Goals reviewed with patient? Yes   SHORT TERM GOALS: Target date: 03/20/2022    Patient will demonstrate 45 degrees of active right shoulder ER  Baseline: Goal status: MET   2.  Patient will be demonstrate 90 degrees of active right shoulder flexion  Baseline:  Goal status: MET   3.  Patient will be independent with basic HEP  Baseline:  Goal status: MET   LONG TERM GOALS: Target date: 04/17/2022  Patient will use his right arm for all ADL's  Baseline:  Goal status: ongoing   2.  Patient will return to drumming  Baseline:  Goal status: ongoing   3.  Patient will be independent with a full exercise program  Baseline:  Goal status: ongoing     PLAN: PT FREQUENCY: 2x/week   PT DURATION: 8 weeks   PLANNED INTERVENTIONS: Therapeutic exercises, Therapeutic activity, Neuromuscular re-education, Balance training, Gait training, Patient/Family education, Joint mobilization, Aquatic Therapy, Dry Needling, Cryotherapy, Moist heat, Taping, Ultrasound, and Manual therapy   PLAN FOR NEXT SESSION:  Continue PROM, AAROM, isometrics, and stability exercise per protocol.    Daleen Bo, PT 03/30/2022, 9:33 AM

## 2022-03-31 ENCOUNTER — Other Ambulatory Visit (HOSPITAL_COMMUNITY): Payer: Self-pay

## 2022-04-03 ENCOUNTER — Other Ambulatory Visit (HOSPITAL_COMMUNITY): Payer: Self-pay

## 2022-04-03 MED ORDER — WEGOVY 2.4 MG/0.75ML ~~LOC~~ SOAJ
2.4000 mg | SUBCUTANEOUS | 0 refills | Status: DC
  Filled 2022-04-03: qty 3, 28d supply, fill #0

## 2022-04-04 ENCOUNTER — Ambulatory Visit (HOSPITAL_BASED_OUTPATIENT_CLINIC_OR_DEPARTMENT_OTHER): Payer: 59 | Admitting: Physical Therapy

## 2022-04-04 DIAGNOSIS — M25611 Stiffness of right shoulder, not elsewhere classified: Secondary | ICD-10-CM | POA: Diagnosis not present

## 2022-04-04 DIAGNOSIS — M25511 Pain in right shoulder: Secondary | ICD-10-CM | POA: Diagnosis not present

## 2022-04-04 DIAGNOSIS — M6281 Muscle weakness (generalized): Secondary | ICD-10-CM | POA: Diagnosis not present

## 2022-04-04 DIAGNOSIS — G8929 Other chronic pain: Secondary | ICD-10-CM | POA: Diagnosis not present

## 2022-04-04 NOTE — Therapy (Signed)
OUTPATIENT PHYSICAL THERAPY SHOULDER TREATMENT   Patient Name: Jason Norton MRN: [id number] DOB:1955-09-16, 66 y.o., male Today's Date: 04/04/2022   PT End of Session - 04/04/22 1012     Visit Number 9    Number of Visits 16    Date for PT Re-Evaluation 04/17/22    Authorization Type [facility]    PT Start Time 0803    PT Stop Time 0845    PT Time Calculation (min) 42 min    Activity Tolerance Patient tolerated treatment well    Behavior During Therapy [facility] for tasks assessed/performed                   Past Medical History:  Diagnosis Date   Carpal tunnel syndrome    Clotting disorder (HCC)    Diverticulosis    DJD (degenerative joint disease)    Gout    Hyperlipemia    Hypothyroidism    Meningitis    at age 65 weeks old   PE (pulmonary embolism) 09/12/2003   hx post freq [id number]   Umbilical hernia    Past Surgical History:  Procedure Laterality Date   BACK SURGERY     lumb lam   CARPAL TUNNEL RELEASE  08/27/2012   Procedure: CARPAL TUNNEL RELEASE;  Surgeon: Roseanne Kaufman, MD;  Location: [place];  Service: Orthopedics;  Laterality: Right;  Right Limited Open Carpal Tunnel Release    COLONOSCOPY     I & D EXTREMITY  09/07/2012   Procedure: IRRIGATION AND DEBRIDEMENT EXTREMITY;  Surgeon: Linna Hoff, MD;  Location: [place];  Service: Orthopedics;  Laterality: Right;   INSERTION OF MESH N/A 05/12/2015   Procedure: INSERTION OF MESH;  Surgeon: Ralene Ok, MD;  Location: [place];  Service: General;  Laterality: N/A;   KNEE ARTHROPLASTY Right 09/11/1973   right knee reconstruction hign [id number]   KNEE ARTHROSCOPY Right    x3   REVERSE SHOULDER ARTHROPLASTY Right 01/24/2022   Procedure: RIGHT REVERSE SHOULDER ARTHROPLASTY;  Surgeon: Meredith Pel, MD;  Location: [place];  Service: Orthopedics;  Laterality: Right;   STERIOD INJECTION  08/27/2012   Procedure: STEROID INJECTION;  Surgeon: Roseanne Kaufman, MD;  Location: [place];  Service: Orthopedics;  Laterality: Left;   TOTAL HIP ARTHROPLASTY  09/11/2010   left   TOTAL KNEE ARTHROPLASTY  09/11/2005   rt   TOTAL KNEE REVISION  28/41/3244   rt   UMBILICAL HERNIA REPAIR N/A 05/12/2015   Procedure: LAPAROSCOPIC UMBILICAL HERNIA REPAIR;  Surgeon: Ralene Ok, MD;  Location: [place];  Service: General;  Laterality: N/A;   WRIST ARTHROSCOPY  09/11/2004   debrid-rt   Patient Active Problem List   Diagnosis Date Noted   Arthritis of right shoulder region    S/P reverse total shoulder arthroplasty, right 09/13/7251   Umbilical hernia 66/44/0347   Hematoma, postoperative 09/07/2012   Chronic anticoagulation 09/07/2012   GOUT 10/25/2007   History of pulmonary embolism 10/25/2007   DIARRHEA 10/25/2007   PERSONAL HISTORY OF ARTHRITIS 10/25/2007  PCP: Dr Domenick Gong    REFERRING PROVIDER: Dr Meredith Pel    REFERRING DIAG: Right Reverse Total Shoulder    THERAPY DIAG:  No diagnosis found.   Rationale for Evaluation and Treatment Rehabilitation   ONSET DATE: 01/24/2022   SUBJECTIVE:  SUBJECTIVE STATEMENT: Pt states taking it easier the last few days has reduced soreness. It is just tight this morning.    PERTINENT HISTORY: LBP, Carpal tunnel, DJD, PE's Low back surgery,    PAIN:  Are you having pain? No: NPRS scale: 0/10 Pain location: lateral shoulder  Pain description: aching  Aggravating factors: steri strips pull at times  Relieving factors: rest  PRECAUTIONS: None and Shoulder TSA precautions    WEIGHT BEARING RESTRICTIONS  None    FALLS:  Has patient fallen in last 6 months? No   LIVING ENVIRONMENT: Nothing pertinent  OCCUPATION: Retired   Hobbies: basketball    PLOF: Independent   PATIENT GOALS    To improve functional use of  his shoulder.      OBJECTIVE:                TODAY'S TREATMENT:   7/25 Pulleys flexion and ABD- 63min PROM flexion and ER with grade III AP and superior  mobs Bent over row 2lbs 2x10 Bent over T 2x10 no weight Wall ball 2x10 CW CCW and up down            ABC's 1 lb             SL ER 1 lb 3x10  Standing active flexion 2x10  Row x20 red  Shoulder extension x20 red      7/20  Pulleys flexion and ABD- 47min PROM flexion and ER with grade III AP and superior  mobs  Single arm supine press - 2x10 Wall ball 2x10 CW CCW and up down Wall  protraction (hands off wall) hands moving to the wall 2x10 Standing wall biceps stretch 30s 3x Standing ABD elbow bent 2x10 (focus on reduction of UT)  Standing YTB ER 2x10 Supine active flexion - 2x10 (at mirror)  Bent over row 2lbs 2x10 Bent over T 2x10 no weight   7/18  PROM flexion and ER with grade III AP and superior mobs  STM R deltoid, triceps, and infra  Single arm supine press - 2x10 1lb Supine rhythmic stabilizations 4x 20s Supine protraction 2x10 Seated ABD elbow bent 2x10   On hold today:  S/L ER very small ROM 2x10 Supine active flexion - 2x10 Bent over row 2lbs 2x10 Standing AROM IR/ER 2x10 bilat  7/11 Pulleys flexion and ABD- 4min PROM flexion and ER with grade I/II AP and inf mobs STM R triceps and infra  Single arm supine press - 2x10 Supine rhythmic stabilizations 4x 2s Supine protraction 2x10 S/L ABD elbow bent 2x10 S/L ER very small ROM 2x10 Supine active flexion - 2x10 Bent over row 2lbs 2x10 Standing AROM IR/ER 2x10 bilat  7/6 Pulleys flexion and ABD- 14min PROM flexion and ER with grade I/II AP and inf mobs STM R deltoid  Single arm supine press - 2x8 S/L ABD elbow bent 2x10 Supine active flexion - 1x15 Shoulder row - 2x10, green band IR BHB stretch to waist 2s 10x Standing AROM IR/ER 2x10 bilat       PATIENT EDUCATION: Education details: reviewed HEP; symptom management; progression of  activity  Person educated: Patient Education method: Explanation, Demonstration, Tactile cues, Verbal cues, and Handouts Education comprehension: verbalized understanding, returned demonstration, and needs further education     HOME EXERCISE PROGRAM: Access Code [email or web address]     ASSESSMENT:   CLINICAL IMPRESSION: The patient continues to make progress. His ER ROM is improving. Therapy added ABC's for multiplanar strengthening. He tolerated well. He had no pain  with his exercises. He was advised to continue with his HEP at home but to listen to his shoulder. Therayp will continue to advance functional strengthening as tolerated. Ge was given an updated HEP.    OBJECTIVE IMPAIRMENTS decreased mobility, decreased ROM, decreased strength, impaired UE functional use, and pain.    ACTIVITY LIMITATIONS carrying, lifting, bathing, dressing, reach over head, and hygiene/grooming   PARTICIPATION LIMITATIONS: meal prep, cleaning, driving, and yard work   PERSONAL FACTORS 3+ comorbidities: Low Back DDD; Thyroid disorder; right carpal tunnel release  are also affecting patient's functional outcome.    REHAB POTENTIAL: Excellent   CLINICAL DECISION MAKING: Stable/uncomplicated   EVALUATION COMPLEXITY: Low     GOALS: Goals reviewed with patient? Yes   SHORT TERM GOALS: Target date: 03/20/2022    Patient will demonstrate 45 degrees of active right shoulder ER  Baseline: Goal status: MET   2.  Patient will be demonstrate 90 degrees of active right shoulder flexion  Baseline:  Goal status: MET   3.  Patient will be independent with basic HEP  Baseline:  Goal status: MET   LONG TERM GOALS: Target date: 04/17/2022  Patient will use his right arm for all ADL's  Baseline:  Goal status: ongoing   2.  Patient will return to drumming  Baseline:  Goal status: ongoing   3.  Patient will be independent with a full exercise program  Baseline:  Goal status: ongoing     PLAN: PT FREQUENCY:  2x/week   PT DURATION: 8 weeks   PLANNED INTERVENTIONS: Therapeutic exercises, Therapeutic activity, Neuromuscular re-education, Balance training, Gait training, Patient/Family education, Joint mobilization, Aquatic Therapy, Dry Needling, Cryotherapy, Moist heat, Taping, Ultrasound, and Manual therapy   PLAN FOR NEXT SESSION:  Continue PROM, AAROM, isometrics, and stability exercise per protocol.    Carney Living, PT 04/04/2022, 10:13 AM

## 2022-04-06 ENCOUNTER — Encounter (HOSPITAL_BASED_OUTPATIENT_CLINIC_OR_DEPARTMENT_OTHER): Payer: Self-pay | Admitting: Physical Therapy

## 2022-04-06 ENCOUNTER — Ambulatory Visit (HOSPITAL_BASED_OUTPATIENT_CLINIC_OR_DEPARTMENT_OTHER): Payer: 59 | Admitting: Physical Therapy

## 2022-04-06 DIAGNOSIS — M25511 Pain in right shoulder: Secondary | ICD-10-CM | POA: Diagnosis not present

## 2022-04-06 DIAGNOSIS — M25611 Stiffness of right shoulder, not elsewhere classified: Secondary | ICD-10-CM | POA: Diagnosis not present

## 2022-04-06 DIAGNOSIS — M6281 Muscle weakness (generalized): Secondary | ICD-10-CM

## 2022-04-06 DIAGNOSIS — G8929 Other chronic pain: Secondary | ICD-10-CM | POA: Diagnosis not present

## 2022-04-06 NOTE — Therapy (Signed)
OUTPATIENT PHYSICAL THERAPY SHOULDER TREATMENT   Patient Name: Jason Norton MRN: [id number] DOB:[birth year], 66 y.o., male Today's Date: 04/06/2022           Past Medical History:  Diagnosis Date   Carpal tunnel syndrome    Clotting disorder (HCC)    Diverticulosis    DJD (degenerative joint disease)    Gout    Hyperlipemia    Hypothyroidism    Meningitis    at age 56 weeks old   PE (pulmonary embolism) 09/12/2003   hx post freq [id number]   Umbilical hernia    Past Surgical History:  Procedure Laterality Date   BACK SURGERY     lumb lam   CARPAL TUNNEL RELEASE  08/27/2012   Procedure: CARPAL TUNNEL RELEASE;  Surgeon: Roseanne Kaufman, MD;  Location: [place];  Service: Orthopedics;  Laterality: Right;  Right Limited Open Carpal Tunnel Release    COLONOSCOPY     I & D EXTREMITY  09/07/2012   Procedure: IRRIGATION AND DEBRIDEMENT EXTREMITY;  Surgeon: Linna Hoff, MD;  Location: [place];  Service: Orthopedics;  Laterality: Right;   INSERTION OF MESH N/A 05/12/2015   Procedure: INSERTION OF MESH;  Surgeon: Ralene Ok, MD;  Location: [place];  Service: General;  Laterality: N/A;   KNEE ARTHROPLASTY Right 09/11/1973   right knee reconstruction hign [id number]   KNEE ARTHROSCOPY Right    x3   REVERSE SHOULDER ARTHROPLASTY Right 01/24/2022   Procedure: RIGHT REVERSE SHOULDER ARTHROPLASTY;  Surgeon: Meredith Pel, MD;  Location: [place];  Service: Orthopedics;  Laterality: Right;   STERIOD INJECTION  08/27/2012   Procedure: STEROID INJECTION;  Surgeon: Roseanne Kaufman, MD;  Location: [place];  Service: Orthopedics;  Laterality: Left;   TOTAL HIP ARTHROPLASTY  09/11/2010   left   TOTAL KNEE ARTHROPLASTY  09/11/2005   rt   TOTAL KNEE REVISION  10/22/1733   rt   UMBILICAL HERNIA REPAIR N/A 05/12/2015   Procedure: LAPAROSCOPIC UMBILICAL HERNIA REPAIR;  Surgeon: Ralene Ok, MD;  Location: [place];  Service: General;   Laterality: N/A;   WRIST ARTHROSCOPY  09/11/2004   debrid-rt   Patient Active Problem List   Diagnosis Date Noted   Arthritis of right shoulder region    S/P reverse total shoulder arthroplasty, right 67/09/4101   Umbilical hernia 10/11/4386   Hematoma, postoperative 09/07/2012   Chronic anticoagulation 09/07/2012   GOUT 10/25/2007   History of pulmonary embolism 10/25/2007   DIARRHEA 10/25/2007   PERSONAL HISTORY OF ARTHRITIS 10/25/2007  PCP: Dr Domenick Gong    REFERRING PROVIDER: Dr Meredith Pel    REFERRING DIAG: Right Reverse Total Shoulder    THERAPY DIAG:  No diagnosis found.   Rationale for Evaluation and Treatment Rehabilitation   ONSET DATE: 01/24/2022   SUBJECTIVE:  SUBJECTIVE STATEMENT: Pt had to get down on his hands and knees yesterday. His shoulder is a little stiff but it is not to bad.    PERTINENT HISTORY: LBP, Carpal tunnel, DJD, PE's Low back surgery,    PAIN:  Are you having pain? No: NPRS scale: 0/10 Pain location: lateral shoulder  Pain description: aching  Aggravating factors: steri strips pull at times  Relieving factors: rest  PRECAUTIONS: None and Shoulder TSA precautions    WEIGHT BEARING RESTRICTIONS  None    FALLS:  Has patient fallen in last 6 months? No   LIVING ENVIRONMENT: Nothing pertinent  OCCUPATION: Retired   Hobbies: basketball    PLOF: Independent   PATIENT GOALS    To improve functional use of his shoulder.      OBJECTIVE:   ER 61 degrees              TODAY'S TREATMENT:  7/27 Pulleys flexion and ABD- 343mn PROM flexion and ER with grade III AP and superior  mobs Bent over row 2lbs 2x10 Bent over T 2x10 no weight Wall ball 2x10 CW CCW and up down            ABC's 1 lb             SL ER 1 lb 3x10  Standing active flexion  2x10  Row x20 red  Shoulder extension x20 red     7/25 Pulleys flexion and ABD- 25m PROM flexion and ER with grade III AP and superior  mobs Bent over row 2lbs 2x10 Bent over T 2x10 no weight Wall ball 2x10 CW CCW and up down            ABC's 1 lb             SL ER 1 lb 3x10  Standing active flexion 2x10  Row x20 red  Shoulder extension x20 red      7/20  Pulleys flexion and ABD- 43m51mPROM flexion and ER with grade III AP and superior  mobs  Single arm supine press - 2x10 Wall ball 2x10 CW CCW and up down Wall  protraction (hands off wall) hands moving to the wall 2x10 Standing wall biceps stretch 30s 3x Standing ABD elbow bent 2x10 (focus on reduction of UT)  Standing YTB ER 2x10 Supine active flexion - 2x10 (at mirror)  Bent over row 2lbs 2x10 Bent over T 2x10 no weight   7/18  PROM flexion and ER with grade III AP and superior mobs  STM R deltoid, triceps, and infra  Single arm supine press - 2x10 1lb Supine rhythmic stabilizations 4x 20s Supine protraction 2x10 Seated ABD elbow bent 2x10   On hold today:  S/L ER very small ROM 2x10 Supine active flexion - 2x10 Bent over row 2lbs 2x10 Standing AROM IR/ER 2x10 bilat         PATIENT EDUCATION: Education details: reviewed HEP; symptom management; progression of activity  Person educated: Patient Education method: Explanation, Demonstration, Tactile cues, Verbal cues, and Handouts Education comprehension: verbalized understanding, returned demonstration, and needs further education     HOME EXERCISE PROGRAM: Access Code [email or web address]  ASSESSMENT:   CLINICAL IMPRESSION: The patient is making great progress. We advanced his row to green today. He tolerated his exercises well. His passive range of motion is nearing end range. His ER was measured at 60 degrees. 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 He  was advised not to lift anything heavey     OBJECTIVE IMPAIRMENTS  decreased mobility, decreased ROM, decreased strength, impaired UE functional use, and pain.    ACTIVITY LIMITATIONS carrying, lifting, bathing, dressing, reach over head, and hygiene/grooming   PARTICIPATION LIMITATIONS: meal prep, cleaning, driving, and yard work   PERSONAL FACTORS 3+ comorbidities: Low Back DDD; Thyroid disorder; right carpal tunnel release  are also affecting patient's functional outcome.    REHAB POTENTIAL: Excellent   CLINICAL DECISION MAKING: Stable/uncomplicated   EVALUATION COMPLEXITY: Low     GOALS: Goals reviewed with patient? Yes   SHORT TERM GOALS: Target date: 03/20/2022    Patient will demonstrate 45 degrees of active right shoulder ER  Baseline: Goal status: MET   2.  Patient will be demonstrate 90 degrees of active right shoulder flexion  Baseline:  Goal status: MET   3.  Patient will be independent with basic HEP  Baseline:  Goal status: MET   LONG TERM GOALS: Target date: 04/17/2022  Patient will use his right arm for all ADL's  Baseline:  Goal status: ongoing   2.  Patient will return to drumming  Baseline:  Goal status: ongoing   3.  Patient will be independent with a full exercise program  Baseline:  Goal status: ongoing     PLAN: PT FREQUENCY: 2x/week   PT DURATION: 8 weeks   PLANNED INTERVENTIONS: Therapeutic exercises, Therapeutic activity, Neuromuscular re-education, Balance training, Gait training, Patient/Family education, Joint mobilization, Aquatic Therapy, Dry Needling, Cryotherapy, Moist heat, Taping, Ultrasound, and Manual therapy   PLAN FOR NEXT SESSION:  Continue PROM, AAROM, isometrics, and stability exercise per protocol. Progress strengthening per protocol    Carney Living, PT 04/06/2022, 8:11 AM

## 2022-04-10 ENCOUNTER — Other Ambulatory Visit (HOSPITAL_COMMUNITY): Payer: Self-pay

## 2022-04-18 ENCOUNTER — Encounter (HOSPITAL_BASED_OUTPATIENT_CLINIC_OR_DEPARTMENT_OTHER): Payer: 59 | Admitting: Physical Therapy

## 2022-04-19 ENCOUNTER — Ambulatory Visit (INDEPENDENT_AMBULATORY_CARE_PROVIDER_SITE_OTHER): Payer: 59 | Admitting: Orthopedic Surgery

## 2022-04-19 DIAGNOSIS — Z96611 Presence of right artificial shoulder joint: Secondary | ICD-10-CM

## 2022-04-20 ENCOUNTER — Encounter (HOSPITAL_BASED_OUTPATIENT_CLINIC_OR_DEPARTMENT_OTHER): Payer: Self-pay | Admitting: Physical Therapy

## 2022-04-20 ENCOUNTER — Ambulatory Visit (HOSPITAL_BASED_OUTPATIENT_CLINIC_OR_DEPARTMENT_OTHER): Payer: 59 | Attending: Orthopedic Surgery | Admitting: Physical Therapy

## 2022-04-20 DIAGNOSIS — G8929 Other chronic pain: Secondary | ICD-10-CM | POA: Insufficient documentation

## 2022-04-20 DIAGNOSIS — M25611 Stiffness of right shoulder, not elsewhere classified: Secondary | ICD-10-CM | POA: Diagnosis not present

## 2022-04-20 DIAGNOSIS — M6281 Muscle weakness (generalized): Secondary | ICD-10-CM | POA: Insufficient documentation

## 2022-04-20 DIAGNOSIS — M25511 Pain in right shoulder: Secondary | ICD-10-CM | POA: Diagnosis not present

## 2022-04-20 NOTE — Therapy (Signed)
OUTPATIENT PHYSICAL THERAPY RE-CERTIFICATION   Patient Name: Jason Norton MRN: [id number] DOB:[birth year], 66 y.o., male Today's Date: 04/20/2022   PT End of Session - 04/20/22 0909     Visit Number 11    Number of Visits 25    Date for PT Re-Evaluation 07/16/22    Authorization Type [facility]    PT Start Time 0845    PT Stop Time 0927    PT Time Calculation (min) 42 min    Activity Tolerance Patient tolerated treatment well    Behavior During Therapy [facility] for tasks assessed/performed                    Past Medical History:  Diagnosis Date   Carpal tunnel syndrome    Clotting disorder (HCC)    Diverticulosis    DJD (degenerative joint disease)    Gout    Hyperlipemia    Hypothyroidism    Meningitis    at age 15 weeks old   PE (pulmonary embolism) 09/12/2003   hx post freq [id number]   Umbilical hernia    Past Surgical History:  Procedure Laterality Date   BACK SURGERY     lumb lam   CARPAL TUNNEL RELEASE  08/27/2012   Procedure: CARPAL TUNNEL RELEASE;  Surgeon: Roseanne Kaufman, MD;  Location: [place];  Service: Orthopedics;  Laterality: Right;  Right Limited Open Carpal Tunnel Release    COLONOSCOPY     I & D EXTREMITY  09/07/2012   Procedure: IRRIGATION AND DEBRIDEMENT EXTREMITY;  Surgeon: Linna Hoff, MD;  Location: [place];  Service: Orthopedics;  Laterality: Right;   INSERTION OF MESH N/A 05/12/2015   Procedure: INSERTION OF MESH;  Surgeon: Ralene Ok, MD;  Location: [place];  Service: General;  Laterality: N/A;   KNEE ARTHROPLASTY Right 09/11/1973   right knee reconstruction hign [id number]   KNEE ARTHROSCOPY Right    x3   REVERSE SHOULDER ARTHROPLASTY Right 01/24/2022   Procedure: RIGHT REVERSE SHOULDER ARTHROPLASTY;  Surgeon: Meredith Pel, MD;  Location: [place];  Service: Orthopedics;  Laterality: Right;   STERIOD INJECTION  08/27/2012   Procedure: STEROID INJECTION;  Surgeon: Roseanne Kaufman, MD;  Location: [place];  Service: Orthopedics;  Laterality: Left;   TOTAL HIP ARTHROPLASTY  09/11/2010   left   TOTAL KNEE ARTHROPLASTY  09/11/2005   rt   TOTAL KNEE REVISION  47/20/7218   rt   UMBILICAL HERNIA REPAIR N/A 05/12/2015   Procedure: LAPAROSCOPIC UMBILICAL HERNIA REPAIR;  Surgeon: Ralene Ok, MD;  Location: [place];  Service: General;  Laterality: N/A;   WRIST ARTHROSCOPY  09/11/2004   debrid-rt   Patient Active Problem List   Diagnosis Date Noted   Arthritis of right shoulder region    S/P reverse total shoulder arthroplasty, right 28/83/3744   Umbilical hernia 51/46/0479   Hematoma, postoperative 09/07/2012   Chronic anticoagulation 09/07/2012   GOUT 10/25/2007   History of pulmonary embolism 10/25/2007   DIARRHEA 10/25/2007   PERSONAL HISTORY OF ARTHRITIS 10/25/2007  PCP: Dr Domenick Gong    REFERRING PROVIDER: Dr Meredith Pel    REFERRING DIAG: Right Reverse Total Shoulder    THERAPY DIAG:  No diagnosis found.   Rationale for Evaluation and Treatment Rehabilitation   ONSET DATE: 01/24/2022   SUBJECTIVE:  SUBJECTIVE STATEMENT: Pt states his shoulder is a little sore but no extra pain.    PERTINENT HISTORY: LBP, Carpal tunnel, DJD, PE's Low back surgery,    PAIN:  Are you having pain? No: NPRS scale: 0/10 Pain location: lateral shoulder  Pain description: aching  Aggravating factors: steri strips pull at times  Relieving factors: rest  PRECAUTIONS: None and Shoulder TSA precautions    WEIGHT BEARING RESTRICTIONS  None    FALLS:  Has patient fallen in last 6 months? No   LIVING ENVIRONMENT: Nothing pertinent  OCCUPATION: Retired   Hobbies: basketball    PLOF: Independent   PATIENT GOALS    To improve functional use of his shoulder.      OBJECTIVE:     FOTO: 63 8/10    UPPER EXTREMITY ROM:    Active ROM Right 8/10  Shoulder flexion  125 p!  Shoulder extension  30  Shoulder abduction 122 p!   Shoulder adduction    Shoulder internal rotation Reach to side pocket p! In triceps   Shoulder external rotation 58   (Blank rows = not tested)      UPPER EXTREMITY MMT:   MMT Right 8/10  Shoulder flexion  4-/5  Shoulder extension    Shoulder abduction  4-/5  Shoulder adduction    Shoulder internal rotation 4-/5   Shoulder external rotation  4-/5               TODAY'S TREATMENT:   8/10  PROM flexion and ER with grade III AP and superior  mobs  Supine press 3lbs 2x10 Bent over row 3lbs 2x10 Bent over T 2x10 no weight Wall ball 2x10 CW CCW and up down            ABC's 1 lb             SL ER 1 lb 3x10  Standing active flexion 2x10  Row x20 red  Shoulder extension x20 red   7/27 Pulleys flexion and ABD- 92min PROM flexion and ER with grade III AP and superior  mobs Bent over row 2lbs 2x10 Bent over T 2x10 no weight Wall ball 2x10 CW CCW and up down            ABC's 1 lb             SL ER 1 lb 3x10  Standing active flexion 2x10  Row x20 red  Shoulder extension x20 red     7/25 Pulleys flexion and ABD- 46min PROM flexion and ER with grade III AP and superior  mobs Bent over row 2lbs 2x10 Bent over T 2x10 no weight Wall ball 2x10 CW CCW and up down            ABC's 1 lb             SL ER 1 lb 3x10  Standing active flexion 2x10  Row x20 red  Shoulder extension x20 red          PATIENT EDUCATION: Education details: anatomy, exercise progression, DOMS expectations, muscle firing,  envelope of function, HEP, POC Person educated: Patient Education method: Explanation, Demonstration, Tactile cues, Verbal cues, and Handouts Education comprehension: verbalized understanding, returned demonstration, and needs further education     HOME EXERCISE PROGRAM: Access Code [email or web address]     ASSESSMENT:   CLINICAL  IMPRESSION: Pt progressing with AROM and strength. However, is still limited with joint mobility, OH, and BHB reaching. Pt is current unable to fully  participate in ADL due to R shoulder limitations. Plan to continue with therapy and begin taper of frequency as pt transitions to independent management. Pt with well managed pain. Pt is largely strength and stiffness limited at this time. Pt would benefit from continued skilled therapy in order to reach goals and maximize functional R UE strength and ROM for full return to PLOF.     OBJECTIVE IMPAIRMENTS decreased mobility, decreased ROM, decreased strength, impaired UE functional use, and pain.    ACTIVITY LIMITATIONS carrying, lifting, bathing, dressing, reach over head, and hygiene/grooming   PARTICIPATION LIMITATIONS: meal prep, cleaning, driving, and yard work   PERSONAL FACTORS 3+ comorbidities: Low Back DDD; Thyroid disorder; right carpal tunnel release  are also affecting patient's functional outcome.    REHAB POTENTIAL: Excellent   CLINICAL DECISION MAKING: Stable/uncomplicated   EVALUATION COMPLEXITY: Low     GOALS:    SHORT TERM GOALS: Target date: 03/20/2022    Patient will demonstrate 45 degrees of active right shoulder ER  Baseline: Goal status: MET   2.  Patient will be demonstrate 90 degrees of active right shoulder flexion  Baseline:  Goal status: MET   3.  Patient will be independent with basic HEP  Baseline:  Goal status: MET   LONG TERM GOALS: Target date: 07/16/2022  Patient will use his right arm for all ADL's  Baseline:  Goal status: ongoing   2.  Patient will return to drumming  Baseline:  Goal status: ongoing   3.  Patient will be independent with a full exercise program  Baseline:  Goal status: ongoing  4.  Pt will be able to reach [facility] and carry/hold >5 lbs in order to demonstrate functional improvement in R UE strength for return to PLOF and exercise.   Goal status: NEW      PLAN: PT  FREQUENCY: 1-2x/week; dropping down to 1x every 2 wks   PT DURATION: 12 weeks (likely D/C in 8wks)    PLANNED INTERVENTIONS: Therapeutic exercises, Therapeutic activity, Neuromuscular re-education, Balance training, Gait training, Patient/Family education, Joint mobilization, Aquatic Therapy, Dry Needling, Cryotherapy, Moist heat, Taping, Ultrasound, and Manual therapy   PLAN FOR NEXT SESSION:  Continue PROM, AAROM, isometrics, and stability exercise per protocol. Progress strengthening per protocol    Daleen Bo, PT 04/20/2022, 9:30 AM

## 2022-04-21 ENCOUNTER — Encounter: Payer: Self-pay | Admitting: Orthopedic Surgery

## 2022-04-21 NOTE — Progress Notes (Signed)
Post-Op Visit Note   Patient: Jason Norton           Date of Birth: 16-Oct-1955           MRN: [id number] Visit Date: 04/19/2022 PCP: Haywood Pao, MD   Assessment & Plan:  Chief Complaint:  Chief Complaint  Patient presents with   Right Shoulder - Routine Post Op   Visit Diagnoses:  1. S/P reverse total shoulder arthroplasty, right     Plan: Jason Norton is a 66 year old patient underwent right reverse shoulder replacement 01/24/2022.  Reports some soreness.  He is doing therapy once or twice a week.  Overall he states that he feels great.  Left shoulder is giving him some symptoms.  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  On examination he has passive range of motion of 50/110/150.  Plan at this time is to consider left shoulder injection versus replacement based on his symptoms.  He may require some back surgery this year.  He will let me know what his plans are in the future for intervention on the left-hand side.  Follow-Up Instructions: No follow-ups on file.   Orders:  No orders of the defined types were placed in this encounter.  No orders of the defined types were placed in this encounter.   Imaging: No results found.  PMFS History: Patient Active Problem List   Diagnosis Date Noted   Arthritis of right shoulder region    S/P reverse total shoulder arthroplasty, right 21/30/8657   Umbilical hernia 84/69/6295   Hematoma, postoperative 09/07/2012   Chronic anticoagulation 09/07/2012   GOUT 10/25/2007   History of pulmonary embolism 10/25/2007   DIARRHEA 10/25/2007   PERSONAL HISTORY OF ARTHRITIS 10/25/2007   Past Medical History:  Diagnosis Date   Carpal tunnel syndrome    Clotting disorder (HCC)    Diverticulosis    DJD (degenerative joint disease)    Gout    Hyperlipemia    Hypothyroidism    Meningitis    at age 36 weeks old   PE (pulmonary embolism) 09/12/2003   hx post freq [id number]   Umbilical hernia     Family History  Adopted: Yes    Past  Surgical History:  Procedure Laterality Date   BACK SURGERY     lumb lam   CARPAL TUNNEL RELEASE  08/27/2012   Procedure: CARPAL TUNNEL RELEASE;  Surgeon: Roseanne Kaufman, MD;  Location: [place];  Service: Orthopedics;  Laterality: Right;  Right Limited Open Carpal Tunnel Release    COLONOSCOPY     I & D EXTREMITY  09/07/2012   Procedure: IRRIGATION AND DEBRIDEMENT EXTREMITY;  Surgeon: Linna Hoff, MD;  Location: [place];  Service: Orthopedics;  Laterality: Right;   INSERTION OF MESH N/A 05/12/2015   Procedure: INSERTION OF MESH;  Surgeon: Ralene Ok, MD;  Location: [place];  Service: General;  Laterality: N/A;   KNEE ARTHROPLASTY Right 09/11/1973   right knee reconstruction hign [id number]   KNEE ARTHROSCOPY Right    x3   REVERSE SHOULDER ARTHROPLASTY Right 01/24/2022   Procedure: RIGHT REVERSE SHOULDER ARTHROPLASTY;  Surgeon: Meredith Pel, MD;  Location: [place];  Service: Orthopedics;  Laterality: Right;   STERIOD INJECTION  08/27/2012   Procedure: STEROID INJECTION;  Surgeon: Roseanne Kaufman, MD;  Location: [place];  Service: Orthopedics;  Laterality: Left;   TOTAL HIP ARTHROPLASTY  09/11/2010   left   TOTAL KNEE ARTHROPLASTY  09/11/2005   rt   TOTAL  KNEE REVISION  21/19/4174   rt   UMBILICAL HERNIA REPAIR N/A 05/12/2015   Procedure: LAPAROSCOPIC UMBILICAL HERNIA REPAIR;  Surgeon: Ralene Ok, MD;  Location: [place];  Service: General;  Laterality: N/A;   WRIST ARTHROSCOPY  09/11/2004   debrid-rt   Social History   Occupational History   Not on file  Tobacco Use   Smoking status: Former    Types: Pipe   Smokeless tobacco: Never   Tobacco comments:    " quit a couple of years ago"( 05/11/15)  Vaping Use   Vaping Use: Never used  Substance and Sexual Activity   Alcohol use: Yes    Comment: occasional   Drug use: No   Sexual activity: Yes    Birth control/protection: None    Comment: smokes pipe-occ cigar

## 2022-04-25 ENCOUNTER — Ambulatory Visit (HOSPITAL_BASED_OUTPATIENT_CLINIC_OR_DEPARTMENT_OTHER): Payer: 59 | Admitting: Physical Therapy

## 2022-04-25 ENCOUNTER — Encounter (HOSPITAL_BASED_OUTPATIENT_CLINIC_OR_DEPARTMENT_OTHER): Payer: Self-pay | Admitting: Physical Therapy

## 2022-04-25 DIAGNOSIS — M25611 Stiffness of right shoulder, not elsewhere classified: Secondary | ICD-10-CM | POA: Diagnosis not present

## 2022-04-25 DIAGNOSIS — M6281 Muscle weakness (generalized): Secondary | ICD-10-CM | POA: Diagnosis not present

## 2022-04-25 DIAGNOSIS — M25511 Pain in right shoulder: Secondary | ICD-10-CM | POA: Diagnosis not present

## 2022-04-25 DIAGNOSIS — G8929 Other chronic pain: Secondary | ICD-10-CM

## 2022-04-25 NOTE — Therapy (Signed)
OUTPATIENT PHYSICAL THERAPY RE-CERTIFICATION   Patient Name: Jason Norton MRN: [id number] DOB:[birth year], 66 y.o., male Today's Date: 04/25/2022   PT End of Session - 04/25/22 0858     Visit Number 12    Number of Visits 25    Date for PT Re-Evaluation 07/16/22    Authorization Type [facility]    PT Start Time 0842    PT Stop Time 0923    PT Time Calculation (min) 41 min    Activity Tolerance Patient tolerated treatment well    Behavior During Therapy [facility] for tasks assessed/performed                     Past Medical History:  Diagnosis Date   Carpal tunnel syndrome    Clotting disorder (HCC)    Diverticulosis    DJD (degenerative joint disease)    Gout    Hyperlipemia    Hypothyroidism    Meningitis    at age 66 weeks old   PE (pulmonary embolism) 09/12/2003   hx post freq [id number]   Umbilical hernia    Past Surgical History:  Procedure Laterality Date   BACK SURGERY     lumb lam   CARPAL TUNNEL RELEASE  08/27/2012   Procedure: CARPAL TUNNEL RELEASE;  Surgeon: Roseanne Kaufman, MD;  Location: [place];  Service: Orthopedics;  Laterality: Right;  Right Limited Open Carpal Tunnel Release    COLONOSCOPY     I & D EXTREMITY  09/07/2012   Procedure: IRRIGATION AND DEBRIDEMENT EXTREMITY;  Surgeon: Linna Hoff, MD;  Location: [place];  Service: Orthopedics;  Laterality: Right;   INSERTION OF MESH N/A 05/12/2015   Procedure: INSERTION OF MESH;  Surgeon: Ralene Ok, MD;  Location: [place];  Service: General;  Laterality: N/A;   KNEE ARTHROPLASTY Right 09/11/1973   right knee reconstruction hign [id number]   KNEE ARTHROSCOPY Right    x3   REVERSE SHOULDER ARTHROPLASTY Right 01/24/2022   Procedure: RIGHT REVERSE SHOULDER ARTHROPLASTY;  Surgeon: Meredith Pel, MD;  Location: [place];  Service: Orthopedics;  Laterality: Right;   STERIOD INJECTION  08/27/2012   Procedure: STEROID INJECTION;  Surgeon: Roseanne Kaufman, MD;  Location: [place];  Service: Orthopedics;  Laterality: Left;   TOTAL HIP ARTHROPLASTY  09/11/2010   left   TOTAL KNEE ARTHROPLASTY  09/11/2005   rt   TOTAL KNEE REVISION  94/85/4627   rt   UMBILICAL HERNIA REPAIR N/A 05/12/2015   Procedure: LAPAROSCOPIC UMBILICAL HERNIA REPAIR;  Surgeon: Ralene Ok, MD;  Location: [place];  Service: General;  Laterality: N/A;   WRIST ARTHROSCOPY  09/11/2004   debrid-rt   Patient Active Problem List   Diagnosis Date Noted   Arthritis of right shoulder region    S/P reverse total shoulder arthroplasty, right 03/50/0938   Umbilical hernia 18/29/9371   Hematoma, postoperative 09/07/2012   Chronic anticoagulation 09/07/2012   GOUT 10/25/2007   History of pulmonary embolism 10/25/2007   DIARRHEA 10/25/2007   PERSONAL HISTORY OF ARTHRITIS 10/25/2007  PCP: Dr Domenick Gong    REFERRING PROVIDER: Dr Meredith Pel    REFERRING DIAG: Right Reverse Total Shoulder    THERAPY DIAG:  No diagnosis found.   Rationale for Evaluation and Treatment Rehabilitation   ONSET DATE: 01/24/2022   SUBJECTIVE:  SUBJECTIVE STATEMENT: Pt is 13wks out. Pt states his shoulder was not painful after last session. Pt states that shoulder ABD will feel a catch. There is a pop in the back of the shoulder.    PERTINENT HISTORY: LBP, Carpal tunnel, DJD, PE's Low back surgery,    PAIN:  Are you having pain? No: NPRS scale: 0/10 Pain location: lateral shoulder  Pain description: aching  Aggravating factors: steri strips pull at times  Relieving factors: rest  PRECAUTIONS: None and Shoulder TSA precautions    WEIGHT BEARING RESTRICTIONS  None    FALLS:  Has patient fallen in last 6 months? No   LIVING ENVIRONMENT: Nothing pertinent  OCCUPATION: Retired   Hobbies: basketball     PLOF: Independent   PATIENT GOALS    To improve functional use of his shoulder.      OBJECTIVE:    FOTO: 63 8/10    UPPER EXTREMITY ROM:    Active ROM Right 8/10  Shoulder flexion  125 p!  Shoulder extension  30  Shoulder abduction 122 p!   Shoulder adduction    Shoulder internal rotation Reach to side pocket p! In triceps   Shoulder external rotation 58   (Blank rows = not tested)      UPPER EXTREMITY MMT:   MMT Right 8/10  Shoulder flexion  4-/5  Shoulder extension    Shoulder abduction  4-/5  Shoulder adduction    Shoulder internal rotation 4-/5   Shoulder external rotation  4-/5               TODAY'S TREATMENT:   8/15   Flexion, ABD, and ER with grade III AP and superior  mobs  Supine press 3lbs 2x10 Bilat shoulder ER 2x10 YTB Bent over row 3lbs 2x10 Wall push up 10x             ABC's 2 lb 2x Supine protraction 2lb 3x10 Standing scaption 1lbs 2x10 Shoulder ABD short arm and long lever 10x each  8/10  PROM flexion and ER with grade III AP and superior  mobs  Supine press 3lbs 2x10 Bent over row 3lbs 2x10 Bent over T 2x10 no weight Wall ball 2x10 CW CCW and up down            ABC's 1 lb             SL ER 1 lb 3x10  Standing active flexion 2x10  Row x20 red  Shoulder extension x20 red   7/27 Pulleys flexion and ABD- 40min PROM flexion and ER with grade III AP and superior  mobs Bent over row 2lbs 2x10 Bent over T 2x10 no weight Wall ball 2x10 CW CCW and up down            ABC's 1 lb             SL ER 1 lb 3x10  Standing active flexion 2x10  Row x20 red  Shoulder extension x20 red     7/25 Pulleys flexion and ABD- 58min PROM flexion and ER with grade III AP and superior  mobs Bent over row 2lbs 2x10 Bent over T 2x10 no weight Wall ball 2x10 CW CCW and up down            ABC's 1 lb             SL ER 1 lb 3x10  Standing active flexion 2x10  Row x20 red  Shoulder extension x20 red  PATIENT EDUCATION: Education  details: anatomy, exercise progression, DOMS expectations, muscle firing,  envelope of function, HEP, POC Person educated: Patient Education method: Explanation, Demonstration, Tactile cues, Verbal cues, and Handouts Education comprehension: verbalized understanding, returned demonstration, and needs further education     HOME EXERCISE PROGRAM: Access Code [email or web address]     ASSESSMENT:   CLINICAL IMPRESSION: Pt able to progress HEP at this time with more loaded exercise in multiple planes. However, pt with continued ant delt or biceps tendon pain with ABD motions past ~80 deg. May be consistent with a overuse related type irritation. Pt advised to decreased frequency of HEP at this time to decrease loading dosage on the shoulder and hopefully promote better recovery. Pt is 13 wks at this time. Plan to reassess pain with AROM and resisted exercise. May need larger or longer period of offloading to reduce inflammatory pain. Pt would benefit from continued skilled therapy in order to reach goals and maximize functional R UE strength and ROM for full return to PLOF.     OBJECTIVE IMPAIRMENTS decreased mobility, decreased ROM, decreased strength, impaired UE functional use, and pain.    ACTIVITY LIMITATIONS carrying, lifting, bathing, dressing, reach over head, and hygiene/grooming   PARTICIPATION LIMITATIONS: meal prep, cleaning, driving, and yard work   PERSONAL FACTORS 3+ comorbidities: Low Back DDD; Thyroid disorder; right carpal tunnel release  are also affecting patient's functional outcome.    REHAB POTENTIAL: Excellent   CLINICAL DECISION MAKING: Stable/uncomplicated   EVALUATION COMPLEXITY: Low     GOALS:    SHORT TERM GOALS: Target date: 03/20/2022    Patient will demonstrate 45 degrees of active right shoulder ER  Baseline: Goal status: MET   2.  Patient will be demonstrate 90 degrees of active right shoulder flexion  Baseline:  Goal status: MET   3.  Patient will be  independent with basic HEP  Baseline:  Goal status: MET   LONG TERM GOALS: Target date: 07/21/2022  Patient will use his right arm for all ADL's  Baseline:  Goal status: ongoing   2.  Patient will return to drumming  Baseline:  Goal status: ongoing   3.  Patient will be independent with a full exercise program  Baseline:  Goal status: ongoing  4.  Pt will be able to reach [facility] and carry/hold >5 lbs in order to demonstrate functional improvement in R UE strength for return to PLOF and exercise.   Goal status: NEW      PLAN: PT FREQUENCY: 1-2x/week; dropping down to 1x every 2 wks   PT DURATION: 12 weeks (likely D/C in 8wks)    PLANNED INTERVENTIONS: Therapeutic exercises, Therapeutic activity, Neuromuscular re-education, Balance training, Gait training, Patient/Family education, Joint mobilization, Aquatic Therapy, Dry Needling, Cryotherapy, Moist heat, Taping, Ultrasound, and Manual therapy   PLAN FOR NEXT SESSION:  Continue PROM, AAROM, isometrics, and stability exercise per protocol. Progress strengthening per protocol    Daleen Bo, PT 04/25/2022, 9:27 AM

## 2022-04-27 ENCOUNTER — Ambulatory Visit (HOSPITAL_BASED_OUTPATIENT_CLINIC_OR_DEPARTMENT_OTHER): Payer: 59 | Admitting: Physical Therapy

## 2022-04-27 ENCOUNTER — Encounter (HOSPITAL_BASED_OUTPATIENT_CLINIC_OR_DEPARTMENT_OTHER): Payer: Self-pay | Admitting: Physical Therapy

## 2022-04-27 DIAGNOSIS — M6281 Muscle weakness (generalized): Secondary | ICD-10-CM

## 2022-04-27 DIAGNOSIS — M25611 Stiffness of right shoulder, not elsewhere classified: Secondary | ICD-10-CM

## 2022-04-27 DIAGNOSIS — M25511 Pain in right shoulder: Secondary | ICD-10-CM | POA: Diagnosis not present

## 2022-04-27 DIAGNOSIS — G8929 Other chronic pain: Secondary | ICD-10-CM

## 2022-04-27 NOTE — Therapy (Signed)
OUTPATIENT PHYSICAL THERAPY TREATMENT   Patient Name: Jason Norton MRN: [id number] DOB:June 08, 1956, 66 y.o., male Today's Date: 04/27/2022   PT End of Session - 04/27/22 0845     Visit Number 13    Number of Visits 25    Date for PT Re-Evaluation 07/16/22    Authorization Type [facility]    PT Start Time 0845    PT Stop Time 0925    PT Time Calculation (min) 40 min    Activity Tolerance Patient tolerated treatment well    Behavior During Therapy [facility] for tasks assessed/performed                     Past Medical History:  Diagnosis Date   Carpal tunnel syndrome    Clotting disorder (HCC)    Diverticulosis    DJD (degenerative joint disease)    Gout    Hyperlipemia    Hypothyroidism    Meningitis    at age 48 weeks old   PE (pulmonary embolism) 09/12/2003   hx post freq [id number]   Umbilical hernia    Past Surgical History:  Procedure Laterality Date   BACK SURGERY     lumb lam   CARPAL TUNNEL RELEASE  08/27/2012   Procedure: CARPAL TUNNEL RELEASE;  Surgeon: Roseanne Kaufman, MD;  Location: [place];  Service: Orthopedics;  Laterality: Right;  Right Limited Open Carpal Tunnel Release    COLONOSCOPY     I & D EXTREMITY  09/07/2012   Procedure: IRRIGATION AND DEBRIDEMENT EXTREMITY;  Surgeon: Linna Hoff, MD;  Location: [place];  Service: Orthopedics;  Laterality: Right;   INSERTION OF MESH N/A 05/12/2015   Procedure: INSERTION OF MESH;  Surgeon: Ralene Ok, MD;  Location: [place];  Service: General;  Laterality: N/A;   KNEE ARTHROPLASTY Right 09/11/1973   right knee reconstruction hign [id number]   KNEE ARTHROSCOPY Right    x3   REVERSE SHOULDER ARTHROPLASTY Right 01/24/2022   Procedure: RIGHT REVERSE SHOULDER ARTHROPLASTY;  Surgeon: Meredith Pel, MD;  Location: [place];  Service: Orthopedics;  Laterality: Right;   STERIOD INJECTION  08/27/2012   Procedure: STEROID INJECTION;  Surgeon: Roseanne Kaufman, MD;  Location: [place];  Service: Orthopedics;  Laterality: Left;   TOTAL HIP ARTHROPLASTY  09/11/2010   left   TOTAL KNEE ARTHROPLASTY  09/11/2005   rt   TOTAL KNEE REVISION  62/83/1517   rt   UMBILICAL HERNIA REPAIR N/A 05/12/2015   Procedure: LAPAROSCOPIC UMBILICAL HERNIA REPAIR;  Surgeon: Ralene Ok, MD;  Location: [place];  Service: General;  Laterality: N/A;   WRIST ARTHROSCOPY  09/11/2004   debrid-rt   Patient Active Problem List   Diagnosis Date Noted   Arthritis of right shoulder region    S/P reverse total shoulder arthroplasty, right 61/60/7371   Umbilical hernia 03/06/9484   Hematoma, postoperative 09/07/2012   Chronic anticoagulation 09/07/2012   GOUT 10/25/2007   History of pulmonary embolism 10/25/2007   DIARRHEA 10/25/2007   PERSONAL HISTORY OF ARTHRITIS 10/25/2007  PCP: Dr Domenick Gong    REFERRING PROVIDER: Dr Meredith Pel    REFERRING DIAG: Right Reverse Total Shoulder    THERAPY DIAG:  No diagnosis found.   Rationale for Evaluation and Treatment Rehabilitation   ONSET DATE: 01/24/2022   SUBJECTIVE:  SUBJECTIVE STATEMENT: Pt is 13wks out. Pt states his R shoulder hurts anteriorly today. It hurts to lift today and to go into ABD.  It hurts into the anterior deltoid and biceps PERTINENT HISTORY: LBP, Carpal tunnel, DJD, PE's Low back surgery,    PAIN:  Are you having pain? Yes: NPRS scale: 5/10 Pain location: lateral shoulder  Pain description: aching  Aggravating factors: steri strips pull at times  Relieving factors: rest  PRECAUTIONS: None and Shoulder TSA precautions    WEIGHT BEARING RESTRICTIONS  None    FALLS:  Has patient fallen in last 6 months? No   LIVING ENVIRONMENT: Nothing pertinent  OCCUPATION: Retired   Hobbies: basketball    PLOF:  Independent   PATIENT GOALS    To improve functional use of his shoulder.      OBJECTIVE:    FOTO: 63 8/10    UPPER EXTREMITY ROM:    Active ROM Right 8/10  Shoulder flexion  125 p!  Shoulder extension  30  Shoulder abduction 122 p!   Shoulder adduction    Shoulder internal rotation Reach to side pocket p! In triceps   Shoulder external rotation 58   (Blank rows = not tested)      UPPER EXTREMITY MMT:   MMT Right 8/10  Shoulder flexion  4-/5  Shoulder extension    Shoulder abduction  4-/5  Shoulder adduction    Shoulder internal rotation 4-/5   Shoulder external rotation  4-/5               TODAY'S TREATMENT:   8/17  Flexion, ABD, and ER with grade III AP and superior  mobs STM ant delt, biceps, pec major/minor  Doorway pec/biceps stretch 10s 5x Standing cane ER stretch 5s 10x Cane scaption stretch (too painful) Pulley scaption 40min30s   8/15   Flexion, ABD, and ER with grade III AP and superior  mobs  Supine press 3lbs 2x10 Bilat shoulder ER 2x10 YTB Bent over row 3lbs 2x10 Wall push up 10x             ABC's 2 lb 2x Supine protraction 2lb 3x10 Standing scaption 1lbs 2x10 Shoulder ABD short arm and long lever 10x each  8/10  PROM flexion and ER with grade III AP and superior  mobs  Supine press 3lbs 2x10 Bent over row 3lbs 2x10 Bent over T 2x10 no weight Wall ball 2x10 CW CCW and up down            ABC's 1 lb             SL ER 1 lb 3x10  Standing active flexion 2x10  Row x20 red  Shoulder extension x20 red   7/27 Pulleys flexion and ABD- 37min PROM flexion and ER with grade III AP and superior  mobs Bent over row 2lbs 2x10 Bent over T 2x10 no weight Wall ball 2x10 CW CCW and up down            ABC's 1 lb             SL ER 1 lb 3x10  Standing active flexion 2x10  Row x20 red  Shoulder extension x20 red     7/25 Pulleys flexion and ABD- 64min PROM flexion and ER with grade III AP and superior  mobs Bent over row 2lbs  2x10 Bent over T 2x10 no weight Wall ball 2x10 CW CCW and up down  ABC's 1 lb             SL ER 1 lb 3x10  Standing active flexion 2x10  Row x20 red  Shoulder extension x20 red          PATIENT EDUCATION: Education details: anatomy, exercise progression, DOMS expectations, muscle firing,  envelope of function, HEP, POC Person educated: Patient Education method: Explanation, Demonstration, Tactile cues, Verbal cues, and Handouts Education comprehension: verbalized understanding, returned demonstration, and needs further education     HOME EXERCISE PROGRAM: Access Code [email or web address]     ASSESSMENT:   CLINICAL IMPRESSION: Pt presents with ant shoulder irritation at today's session that was improved with STM and joint mobilizations. Rest of session was stretching and light AAROM which helped to relieve shoulder stiffness. Pt's pain still appears related to overuse related irritation. Pt advised to hold on all resisted HEP at this time for the next two days and resume when pain has reduced. Pain appears to largely be due to muscle hypertonicity and triggerpoint type pain. Pt would benefit from continued skilled therapy in order to reach goals and maximize functional R UE strength and ROM for full return to PLOF.     OBJECTIVE IMPAIRMENTS decreased mobility, decreased ROM, decreased strength, impaired UE functional use, and pain.    ACTIVITY LIMITATIONS carrying, lifting, bathing, dressing, reach over head, and hygiene/grooming   PARTICIPATION LIMITATIONS: meal prep, cleaning, driving, and yard work   PERSONAL FACTORS 3+ comorbidities: Low Back DDD; Thyroid disorder; right carpal tunnel release  are also affecting patient's functional outcome.    REHAB POTENTIAL: Excellent   CLINICAL DECISION MAKING: Stable/uncomplicated   EVALUATION COMPLEXITY: Low     GOALS:    SHORT TERM GOALS: Target date: 03/20/2022    Patient will demonstrate 45 degrees of active right shoulder ER   Baseline: Goal status: MET   2.  Patient will be demonstrate 90 degrees of active right shoulder flexion  Baseline:  Goal status: MET   3.  Patient will be independent with basic HEP  Baseline:  Goal status: MET   LONG TERM GOALS: Target date: 07/23/2022  Patient will use his right arm for all ADL's  Baseline:  Goal status: ongoing   2.  Patient will return to drumming  Baseline:  Goal status: ongoing   3.  Patient will be independent with a full exercise program  Baseline:  Goal status: ongoing  4.  Pt will be able to reach [facility] and carry/hold >5 lbs in order to demonstrate functional improvement in R UE strength for return to PLOF and exercise.   Goal status: NEW      PLAN: PT FREQUENCY: 1-2x/week; dropping down to 1x every 2 wks   PT DURATION: 12 weeks (likely D/C in 8wks)    PLANNED INTERVENTIONS: Therapeutic exercises, Therapeutic activity, Neuromuscular re-education, Balance training, Gait training, Patient/Family education, Joint mobilization, Aquatic Therapy, Dry Needling, Cryotherapy, Moist heat, Taping, Ultrasound, and Manual therapy   PLAN FOR NEXT SESSION:  Continue PROM, AAROM, isometrics, and stability exercise per protocol. Progress strengthening per protocol    Daleen Bo, PT 04/27/2022, 9:34 AM

## 2022-05-01 ENCOUNTER — Other Ambulatory Visit (HOSPITAL_COMMUNITY): Payer: Self-pay

## 2022-05-01 ENCOUNTER — Telehealth (HOSPITAL_BASED_OUTPATIENT_CLINIC_OR_DEPARTMENT_OTHER): Payer: Self-pay | Admitting: Physical Therapy

## 2022-05-01 ENCOUNTER — Ambulatory Visit (HOSPITAL_BASED_OUTPATIENT_CLINIC_OR_DEPARTMENT_OTHER): Payer: 59 | Admitting: Physical Therapy

## 2022-05-01 ENCOUNTER — Encounter (HOSPITAL_BASED_OUTPATIENT_CLINIC_OR_DEPARTMENT_OTHER): Payer: Self-pay | Admitting: Physical Therapy

## 2022-05-01 DIAGNOSIS — M6281 Muscle weakness (generalized): Secondary | ICD-10-CM

## 2022-05-01 DIAGNOSIS — G8929 Other chronic pain: Secondary | ICD-10-CM

## 2022-05-01 DIAGNOSIS — M25611 Stiffness of right shoulder, not elsewhere classified: Secondary | ICD-10-CM | POA: Diagnosis not present

## 2022-05-01 DIAGNOSIS — M25511 Pain in right shoulder: Secondary | ICD-10-CM | POA: Diagnosis not present

## 2022-05-01 NOTE — Therapy (Signed)
OUTPATIENT PHYSICAL THERAPY TREATMENT   Patient Name: LUNDEN MCLEISH MRN: [id number] DOB:04/04/1956, 66 y.o., male Today's Date: 05/01/2022   PT End of Session - 05/01/22 0845     Visit Number 14    Number of Visits 25    Date for PT Re-Evaluation 07/16/22    Authorization Type [facility]    PT Start Time 0845    PT Stop Time 0925    PT Time Calculation (min) 40 min    Activity Tolerance Patient tolerated treatment well    Behavior During Therapy [facility] for tasks assessed/performed                     Past Medical History:  Diagnosis Date   Carpal tunnel syndrome    Clotting disorder (HCC)    Diverticulosis    DJD (degenerative joint disease)    Gout    Hyperlipemia    Hypothyroidism    Meningitis    at age 35 weeks old   PE (pulmonary embolism) 09/12/2003   hx post freq [id number]   Umbilical hernia    Past Surgical History:  Procedure Laterality Date   BACK SURGERY     lumb lam   CARPAL TUNNEL RELEASE  08/27/2012   Procedure: CARPAL TUNNEL RELEASE;  Surgeon: Roseanne Kaufman, MD;  Location: [place];  Service: Orthopedics;  Laterality: Right;  Right Limited Open Carpal Tunnel Release    COLONOSCOPY     I & D EXTREMITY  09/07/2012   Procedure: IRRIGATION AND DEBRIDEMENT EXTREMITY;  Surgeon: Linna Hoff, MD;  Location: [place];  Service: Orthopedics;  Laterality: Right;   INSERTION OF MESH N/A 05/12/2015   Procedure: INSERTION OF MESH;  Surgeon: Ralene Ok, MD;  Location: [place];  Service: General;  Laterality: N/A;   KNEE ARTHROPLASTY Right 09/11/1973   right knee reconstruction hign [id number]   KNEE ARTHROSCOPY Right    x3   REVERSE SHOULDER ARTHROPLASTY Right 01/24/2022   Procedure: RIGHT REVERSE SHOULDER ARTHROPLASTY;  Surgeon: Meredith Pel, MD;  Location: [place];  Service: Orthopedics;  Laterality: Right;   STERIOD INJECTION  08/27/2012   Procedure: STEROID INJECTION;  Surgeon: Roseanne Kaufman, MD;  Location: [place];  Service: Orthopedics;  Laterality: Left;   TOTAL HIP ARTHROPLASTY  09/11/2010   left   TOTAL KNEE ARTHROPLASTY  09/11/2005   rt   TOTAL KNEE REVISION  91/50/5697   rt   UMBILICAL HERNIA REPAIR N/A 05/12/2015   Procedure: LAPAROSCOPIC UMBILICAL HERNIA REPAIR;  Surgeon: Ralene Ok, MD;  Location: [place];  Service: General;  Laterality: N/A;   WRIST ARTHROSCOPY  09/11/2004   debrid-rt   Patient Active Problem List   Diagnosis Date Noted   Arthritis of right shoulder region    S/P reverse total shoulder arthroplasty, right 94/80/1655   Umbilical hernia 37/48/2707   Hematoma, postoperative 09/07/2012   Chronic anticoagulation 09/07/2012   GOUT 10/25/2007   History of pulmonary embolism 10/25/2007   DIARRHEA 10/25/2007   PERSONAL HISTORY OF ARTHRITIS 10/25/2007  PCP: Dr Domenick Gong    REFERRING PROVIDER: Dr Meredith Pel    REFERRING DIAG: Right Reverse Total Shoulder    THERAPY DIAG:  No diagnosis found.   Rationale for Evaluation and Treatment Rehabilitation   ONSET DATE: 01/24/2022   SUBJECTIVE:  SUBJECTIVE STATEMENT: Pt is 14wks out. Pt states that the shoulder feels much abetter after stretching over the weekend. Pushing and pulling a vacuum felt better. It will catch with palm down.   PERTINENT HISTORY: LBP, Carpal tunnel, DJD, PE's Low back surgery,    PAIN:  Are you having pain? Yes: NPRS scale: 2/10 Pain location: ant shoulder  Pain description: bee sting  Aggravating factors: steri strips pull at times  Relieving factors: rest  PRECAUTIONS: None and Shoulder TSA precautions    WEIGHT BEARING RESTRICTIONS  None    FALLS:  Has patient fallen in last 6 months? No   LIVING ENVIRONMENT: Nothing pertinent  OCCUPATION: Retired   Hobbies: basketball     PLOF: Independent   PATIENT GOALS    To improve functional use of his shoulder.      OBJECTIVE:    FOTO: 63 8/10    UPPER EXTREMITY ROM:    Active ROM Right 8/10  Shoulder flexion  125 p!  Shoulder extension  30  Shoulder abduction 122 p!   Shoulder adduction    Shoulder internal rotation Reach to side pocket p! In triceps   Shoulder external rotation 58   (Blank rows = not tested)      UPPER EXTREMITY MMT:   MMT Right 8/10  Shoulder flexion  4-/5  Shoulder extension    Shoulder abduction  4-/5  Shoulder adduction    Shoulder internal rotation 4-/5   Shoulder external rotation  4-/5               TODAY'S TREATMENT:   8/21  Flexion, ABD, and ER with grade III AP and superior  mobs STM ant delt, biceps, pec major/minor  Self tennis ball massage   Supine pec stretch/fly 10s 5x Supine press 3lbs 2x10 Bilat shoulder ER 2x10 YTB Bent over row 3lbs 2x10 Wall push up 10x  ABC's 2 lb 2x Supine protraction 3lb 2x10 Standing scaption 1lbs 2x10    8/17  Flexion, ABD, and ER with grade III AP and superior  mobs STM ant delt, biceps, pec major/minor  Doorway pec/biceps stretch 10s 5x Standing cane ER stretch 5s 10x Cane scaption stretch (too painful) Pulley scaption 26mn30s   8/15   Flexion, ABD, and ER with grade III AP and superior  mobs  Supine press 3lbs 2x10 Bilat shoulder ER 2x10 YTB Bent over row 3lbs 2x10 Wall push up 10x             ABC's 2 lb 2x Supine protraction 2lb 3x10 Standing scaption 1lbs 2x10 Shoulder ABD short arm and long lever 10x each  8/10  PROM flexion and ER with grade III AP and superior  mobs  Supine press 3lbs 2x10 Bent over row 3lbs 2x10 Bent over T 2x10 no weight Wall ball 2x10 CW CCW and up down            ABC's 1 lb             SL ER 1 lb 3x10  Standing active flexion 2x10  Row x20 red  Shoulder extension x20 red   7/27 Pulleys flexion and ABD- 237m PROM flexion and ER with grade III AP and superior   mobs Bent over row 2lbs 2x10 Bent over T 2x10 no weight Wall ball 2x10 CW CCW and up down            ABC's 1 lb             SL ER  1 lb 3x10  Standing active flexion 2x10  Row x20 red  Shoulder extension x20 red     7/25 Pulleys flexion and ABD- 62mn PROM flexion and ER with grade III AP and superior  mobs Bent over row 2lbs 2x10 Bent over T 2x10 no weight Wall ball 2x10 CW CCW and up down            ABC's 1 lb             SL ER 1 lb 3x10  Standing active flexion 2x10  Row x20 red  Shoulder extension x20 red          PATIENT EDUCATION: Education details: anatomy, exercise progression, DOMS expectations, muscle firing,  envelope of function, HEP, POC Person educated: Patient Education method: Explanation, Demonstration, Tactile cues, Verbal cues, and Handouts Education comprehension: verbalized understanding, returned demonstration, and needs further education     HOME EXERCISE PROGRAM: Access Code [email or web address]    ASSESSMENT:   CLINICAL IMPRESSION: Pt with less anterior shoulder irritation and pain at today's session. Pt able to continue with strengthening and able to perform larger compound motions without pain. Pt reports movements feel more fluid with less anterior impinging type pain. Pt progressing well at this time. HEP updated and will plan to progress strength for home at next. Pt would benefit from continued skilled therapy in order to reach goals and maximize functional R UE strength and ROM for full return to PLOF.     OBJECTIVE IMPAIRMENTS decreased mobility, decreased ROM, decreased strength, impaired UE functional use, and pain.    ACTIVITY LIMITATIONS carrying, lifting, bathing, dressing, reach over head, and hygiene/grooming   PARTICIPATION LIMITATIONS: meal prep, cleaning, driving, and yard work   PERSONAL FACTORS 3+ comorbidities: Low Back DDD; Thyroid disorder; right carpal tunnel release  are also affecting patient's functional outcome.    REHAB  POTENTIAL: Excellent   CLINICAL DECISION MAKING: Stable/uncomplicated   EVALUATION COMPLEXITY: Low     GOALS:    SHORT TERM GOALS: Target date: 03/20/2022    Patient will demonstrate 45 degrees of active right shoulder ER  Baseline: Goal status: MET   2.  Patient will be demonstrate 90 degrees of active right shoulder flexion  Baseline:  Goal status: MET   3.  Patient will be independent with basic HEP  Baseline:  Goal status: MET   LONG TERM GOALS: Target date: 07/27/2022  Patient will use his right arm for all ADL's  Baseline:  Goal status: ongoing   2.  Patient will return to drumming  Baseline:  Goal status: ongoing   3.  Patient will be independent with a full exercise program  Baseline:  Goal status: ongoing  4.  [place] [facility] [place] [facility] in order to demonstrate functional improvement in R UE strength for return to PLOF and exercise.   Goal status: NEW      PLAN: PT FREQUENCY: 1-2x/week; dropping down to 1x every 2 wks   PT DURATION: 12 weeks (likely D/C in 8wks)    PLANNED INTERVENTIONS: Therapeutic exercises, Therapeutic activity, Neuromuscular re-education, Balance training, Gait training, Patient/Family education, Joint mobilization, Aquatic Therapy, Dry Needling, Cryotherapy, Moist heat, Taping, Ultrasound, and Manual therapy   PLAN FOR NEXT SESSION:  Continue PROM, AAROM, isometrics, and stability exercise per protocol. Progress strengthening per protocol    ADaleen Bo PT 05/01/2022, 9:28 AM

## 2022-05-02 ENCOUNTER — Other Ambulatory Visit (HOSPITAL_COMMUNITY): Payer: Self-pay

## 2022-05-02 MED ORDER — WEGOVY 2.4 MG/0.75ML ~~LOC~~ SOAJ
2.4000 mg | SUBCUTANEOUS | 0 refills | Status: DC
  Filled 2022-05-02: qty 3, 28d supply, fill #0

## 2022-05-03 ENCOUNTER — Ambulatory Visit (HOSPITAL_BASED_OUTPATIENT_CLINIC_OR_DEPARTMENT_OTHER): Payer: 59 | Admitting: Physical Therapy

## 2022-05-03 ENCOUNTER — Encounter (HOSPITAL_BASED_OUTPATIENT_CLINIC_OR_DEPARTMENT_OTHER): Payer: Self-pay | Admitting: Physical Therapy

## 2022-05-03 DIAGNOSIS — M25611 Stiffness of right shoulder, not elsewhere classified: Secondary | ICD-10-CM | POA: Diagnosis not present

## 2022-05-03 DIAGNOSIS — G8929 Other chronic pain: Secondary | ICD-10-CM

## 2022-05-03 DIAGNOSIS — M25511 Pain in right shoulder: Secondary | ICD-10-CM | POA: Diagnosis not present

## 2022-05-03 DIAGNOSIS — M6281 Muscle weakness (generalized): Secondary | ICD-10-CM

## 2022-05-03 NOTE — Therapy (Signed)
OUTPATIENT PHYSICAL THERAPY TREATMENT   Patient Name: Jason Norton MRN: [id number] DOB:1956/02/20, 66 y.o., male Today's Date: 05/03/2022   PT End of Session - 05/03/22 0849     Visit Number 15    Number of Visits 25    Date for PT Re-Evaluation 07/16/22    Authorization Type [facility]    PT Start Time 0846    PT Stop Time 0926    PT Time Calculation (min) 40 min    Activity Tolerance Patient tolerated treatment well    Behavior During Therapy [facility] for tasks assessed/performed                     Past Medical History:  Diagnosis Date   Carpal tunnel syndrome    Clotting disorder ([place])    Diverticulosis    DJD (degenerative joint disease)    Gout    Hyperlipemia    Hypothyroidism    Meningitis    at age 15 weeks old   PE (pulmonary embolism) 09/12/2003   hx post freq [id number]   Umbilical hernia    Past Surgical History:  Procedure Laterality Date   BACK SURGERY     lumb lam   CARPAL TUNNEL RELEASE  08/27/2012   Procedure: CARPAL TUNNEL RELEASE;  Surgeon: Roseanne Kaufman, MD;  Location: [place];  Service: Orthopedics;  Laterality: Right;  Right Limited Open Carpal Tunnel Release    COLONOSCOPY     I & D EXTREMITY  09/07/2012   Procedure: IRRIGATION AND DEBRIDEMENT EXTREMITY;  Surgeon: Linna Hoff, MD;  Location: [place];  Service: Orthopedics;  Laterality: Right;   INSERTION OF MESH N/A 05/12/2015   Procedure: INSERTION OF MESH;  Surgeon: Ralene Ok, MD;  Location: [place];  Service: General;  Laterality: N/A;   KNEE ARTHROPLASTY Right 09/11/1973   right knee reconstruction hign [id number]   KNEE ARTHROSCOPY Right    x3   REVERSE SHOULDER ARTHROPLASTY Right 01/24/2022   Procedure: RIGHT REVERSE SHOULDER ARTHROPLASTY;  Surgeon: Meredith Pel, MD;  Location: [place];  Service: Orthopedics;  Laterality: Right;   STERIOD INJECTION  08/27/2012   Procedure: STEROID INJECTION;  Surgeon: Roseanne Kaufman, MD;  Location: [place];  Service: Orthopedics;  Laterality: Left;   TOTAL HIP ARTHROPLASTY  09/11/2010   left   TOTAL KNEE ARTHROPLASTY  09/11/2005   rt   TOTAL KNEE REVISION  56/31/4970   rt   UMBILICAL HERNIA REPAIR N/A 05/12/2015   Procedure: LAPAROSCOPIC UMBILICAL HERNIA REPAIR;  Surgeon: Ralene Ok, MD;  Location: [place];  Service: General;  Laterality: N/A;   WRIST ARTHROSCOPY  09/11/2004   debrid-rt   Patient Active Problem List   Diagnosis Date Noted   Arthritis of right shoulder region    S/P reverse total shoulder arthroplasty, right 26/37/8588   Umbilical hernia 50/27/7412   Hematoma, postoperative 09/07/2012   Chronic anticoagulation 09/07/2012   GOUT 10/25/2007   History of pulmonary embolism 10/25/2007   DIARRHEA 10/25/2007   PERSONAL HISTORY OF ARTHRITIS 10/25/2007  PCP: Dr Domenick Gong    REFERRING PROVIDER: Dr Meredith Pel    REFERRING DIAG: Right Reverse Total Shoulder    THERAPY DIAG:  No diagnosis found.   Rationale for Evaluation and Treatment Rehabilitation   ONSET DATE: 01/24/2022   SUBJECTIVE:  SUBJECTIVE STATEMENT: Pt is 14wks out. Pt states that the shoulder feels much better after stretching it out last time. "The joint feels loose like a normal joint now." IR lifting still is uncomfortable but no longer as uncomfortable.  PERTINENT HISTORY: LBP, Carpal tunnel, DJD, PE's Low back surgery,    PAIN:  Are you having pain? no: NPRS scale: 0/10 Pain location: ant shoulder  Pain description: bee sting  Aggravating factors: steri strips pull at times  Relieving factors: rest  PRECAUTIONS: None and Shoulder TSA precautions    WEIGHT BEARING RESTRICTIONS  None    FALLS:  Has patient fallen in last 6 months? No   LIVING ENVIRONMENT: Nothing pertinent   OCCUPATION: Retired   Hobbies: basketball    PLOF: Independent   PATIENT GOALS    To improve functional use of his shoulder.      OBJECTIVE:    FOTO: 63 8/10    UPPER EXTREMITY ROM:    Active ROM Right 8/10  Shoulder flexion  125 p!  Shoulder extension  30  Shoulder abduction 122 p!   Shoulder adduction    Shoulder internal rotation Reach to side pocket p! In triceps   Shoulder external rotation 58   (Blank rows = not tested)      UPPER EXTREMITY MMT:   MMT Right 8/10  Shoulder flexion  4-/5  Shoulder extension    Shoulder abduction  4-/5  Shoulder adduction    Shoulder internal rotation 4-/5   Shoulder external rotation  4-/5               TODAY'S TREATMENT:   8/21  STM ant delt, biceps, pec major/minor  Supine pec stretch/fly 10s 5x ER position wall slide 3s 10x Horizontal ABD RTB 2x10 (R moving only) Bilat shoulder ER 2x10 YTB Bent over row 3lbs 2x10 YTB shoulder extension 2x10 (YTB broke during session, pt was not hit, pt denied any injury) Standing scaption 2lbs 2x10    8/17  Flexion, ABD, and ER with grade III AP and superior  mobs STM ant delt, biceps, pec major/minor  Doorway pec/biceps stretch 10s 5x Standing cane ER stretch 5s 10x Cane scaption stretch (too painful) Pulley scaption 70min30s   8/15   Flexion, ABD, and ER with grade III AP and superior  mobs  Supine press 3lbs 2x10 Bilat shoulder ER 2x10 YTB Bent over row 3lbs 2x10 Wall push up 10x             ABC's 2 lb 2x Supine protraction 2lb 3x10 Standing scaption 1lbs 2x10 Shoulder ABD short arm and long lever 10x each  8/10  PROM flexion and ER with grade III AP and superior  mobs  Supine press 3lbs 2x10 Bent over row 3lbs 2x10 Bent over T 2x10 no weight Wall ball 2x10 CW CCW and up down            ABC's 1 lb             SL ER 1 lb 3x10  Standing active flexion 2x10  Row x20 red  Shoulder extension x20 red   7/27 Pulleys flexion and ABD- 48min PROM  flexion and ER with grade III AP and superior  mobs Bent over row 2lbs 2x10 Bent over T 2x10 no weight Wall ball 2x10 CW CCW and up down            ABC's 1 lb             SL ER  1 lb 3x10  Standing active flexion 2x10  Row x20 red  Shoulder extension x20 red     7/25 Pulleys flexion and ABD- 67min PROM flexion and ER with grade III AP and superior  mobs Bent over row 2lbs 2x10 Bent over T 2x10 no weight Wall ball 2x10 CW CCW and up down            ABC's 1 lb             SL ER 1 lb 3x10  Standing active flexion 2x10  Row x20 red  Shoulder extension x20 red          PATIENT EDUCATION: Education details: anatomy, exercise progression, DOMS expectations, muscle firing,  envelope of function, HEP, POC Person educated: Patient Education method: Explanation, Demonstration, Tactile cues, Verbal cues, and Handouts Education comprehension: verbalized understanding, returned demonstration, and needs further education     HOME EXERCISE PROGRAM: Access Code [email or web address]     ASSESSMENT:   CLINICAL IMPRESSION: Pt with improved anterior shoulder pain at today's session and able to reach further OH and with increased resistance without increased pain. Pt still with pain with resistance greater than 2lbs at this time but progressing very well with AROM in all planes without pain into the biceps or pec. Pt continues to have good results with ER and pec based stretching. HEP updated to progress scapular strength and OH motion. Pt near full flexion at 160 per protocol. Plan to reduce frequency of visits at this time to transition towards independent exercise. Pt would benefit from continued skilled therapy in order to reach goals and maximize functional R UE strength and ROM for full return to PLOF.     OBJECTIVE IMPAIRMENTS decreased mobility, decreased ROM, decreased strength, impaired UE functional use, and pain.    ACTIVITY LIMITATIONS carrying, lifting, bathing, dressing, reach over head, and  hygiene/grooming   PARTICIPATION LIMITATIONS: meal prep, cleaning, driving, and yard work   PERSONAL FACTORS 3+ comorbidities: Low Back DDD; Thyroid disorder; right carpal tunnel release  are also affecting patient's functional outcome.    REHAB POTENTIAL: Excellent   CLINICAL DECISION MAKING: Stable/uncomplicated   EVALUATION COMPLEXITY: Low     GOALS:    SHORT TERM GOALS: Target date: 03/20/2022    Patient will demonstrate 45 degrees of active right shoulder ER  Baseline: Goal status: MET   2.  Patient will be demonstrate 90 degrees of active right shoulder flexion  Baseline:  Goal status: MET   3.  Patient will be independent with basic HEP  Baseline:  Goal status: MET   LONG TERM GOALS: Target date: 07/29/2022  Patient will use his right arm for all ADL's  Baseline:  Goal status: ongoing   2.  Patient will return to drumming  Baseline:  Goal status: ongoing   3.  Patient will be independent with a full exercise program  Baseline:  Goal status: ongoing  4.  Pt will be able to reach [facility] and carry/hold >5 lbs in order to demonstrate functional improvement in R UE strength for return to PLOF and exercise.   Goal status: NEW      PLAN: PT FREQUENCY: 1-2x/week; dropping down to 1x every 2 wks   PT DURATION: 12 weeks (likely D/C in 8wks)    PLANNED INTERVENTIONS: Therapeutic exercises, Therapeutic activity, Neuromuscular re-education, Balance training, Gait training, Patient/Family education, Joint mobilization, Aquatic Therapy, Dry Needling, Cryotherapy, Moist heat, Taping, Ultrasound, and Manual therapy   PLAN FOR NEXT SESSION:  Continue  PROM, AAROM, isometrics, and stability exercise per protocol. Progress strengthening per protocol    Daleen Bo, PT 05/03/2022, 9:42 AM

## 2022-05-10 ENCOUNTER — Ambulatory Visit (HOSPITAL_BASED_OUTPATIENT_CLINIC_OR_DEPARTMENT_OTHER): Payer: 59 | Admitting: Physical Therapy

## 2022-05-10 ENCOUNTER — Encounter (HOSPITAL_BASED_OUTPATIENT_CLINIC_OR_DEPARTMENT_OTHER): Payer: Self-pay | Admitting: Physical Therapy

## 2022-05-10 DIAGNOSIS — G8929 Other chronic pain: Secondary | ICD-10-CM | POA: Diagnosis not present

## 2022-05-10 DIAGNOSIS — M25611 Stiffness of right shoulder, not elsewhere classified: Secondary | ICD-10-CM | POA: Diagnosis not present

## 2022-05-10 DIAGNOSIS — M6281 Muscle weakness (generalized): Secondary | ICD-10-CM | POA: Diagnosis not present

## 2022-05-10 DIAGNOSIS — M25511 Pain in right shoulder: Secondary | ICD-10-CM | POA: Diagnosis not present

## 2022-05-10 NOTE — Therapy (Signed)
OUTPATIENT PHYSICAL THERAPY TREATMENT   Patient Name: Jason Norton MRN: [id number] DOB:[birth year], 66 y.o., male Today's Date: 05/10/2022   PT End of Session - 05/10/22 1543     Visit Number 16    Number of Visits 25    Date for PT Re-Evaluation 07/16/22    Authorization Type [facility]    PT Start Time 3220    PT Stop Time 1555    PT Time Calculation (min) 40 min    Activity Tolerance Patient tolerated treatment well    Behavior During Therapy WFL for tasks assessed/performed                      Past Medical History:  Diagnosis Date   Carpal tunnel syndrome    Clotting disorder ([facility])    Diverticulosis    DJD (degenerative joint disease)    Gout    Hyperlipemia    Hypothyroidism    Meningitis    at age 82 weeks old   PE (pulmonary embolism) 09/12/2003   hx post freq [id number]   Umbilical hernia    Past Surgical History:  Procedure Laterality Date   BACK SURGERY     lumb lam   CARPAL TUNNEL RELEASE  08/27/2012   Procedure: CARPAL TUNNEL RELEASE;  Surgeon: Roseanne Kaufman, MD;  Location: [place];  Service: Orthopedics;  Laterality: Right;  Right Limited Open Carpal Tunnel Release    COLONOSCOPY     I & D EXTREMITY  09/07/2012   Procedure: IRRIGATION AND DEBRIDEMENT EXTREMITY;  Surgeon: Linna Hoff, MD;  Location: [place];  Service: Orthopedics;  Laterality: Right;   INSERTION OF MESH N/A 05/12/2015   Procedure: INSERTION OF MESH;  Surgeon: Ralene Ok, MD;  Location: [place];  Service: General;  Laterality: N/A;   KNEE ARTHROPLASTY Right 09/11/1973   right knee reconstruction hign [id number]   KNEE ARTHROSCOPY Right    x3   REVERSE SHOULDER ARTHROPLASTY Right 01/24/2022   Procedure: RIGHT REVERSE SHOULDER ARTHROPLASTY;  Surgeon: Meredith Pel, MD;  Location: [place];  Service: Orthopedics;  Laterality: Right;   STERIOD INJECTION  08/27/2012   Procedure: STEROID INJECTION;  Surgeon: Roseanne Kaufman, MD;  Location: [place];  Service: Orthopedics;  Laterality: Left;   TOTAL HIP ARTHROPLASTY  09/11/2010   left   TOTAL KNEE ARTHROPLASTY  09/11/2005   rt   TOTAL KNEE REVISION  60/73/7106   rt   UMBILICAL HERNIA REPAIR N/A 05/12/2015   Procedure: LAPAROSCOPIC UMBILICAL HERNIA REPAIR;  Surgeon: Ralene Ok, MD;  Location: [place];  Service: General;  Laterality: N/A;   WRIST ARTHROSCOPY  09/11/2004   debrid-rt   Patient Active Problem List   Diagnosis Date Noted   Arthritis of right shoulder region    S/P reverse total shoulder arthroplasty, right 26/94/8546   Umbilical hernia 27/11/5007   Hematoma, postoperative 09/07/2012   Chronic anticoagulation 09/07/2012   GOUT 10/25/2007   History of pulmonary embolism 10/25/2007   DIARRHEA 10/25/2007   PERSONAL HISTORY OF ARTHRITIS 10/25/2007  PCP: Dr Domenick Gong    REFERRING PROVIDER: Dr Meredith Pel    REFERRING DIAG: Right Reverse Total Shoulder    THERAPY DIAG:  No diagnosis found.   Rationale for Evaluation and Treatment Rehabilitation   ONSET DATE: 01/24/2022   SUBJECTIVE:  SUBJECTIVE STATEMENT: Pt is 14wks out.   Pt states he had no issues after last session. Feels tight today but no pain since last session.    PERTINENT HISTORY: LBP, Carpal tunnel, DJD, PE's Low back surgery,    PAIN:  Are you having pain? no: NPRS scale: 0/10 Pain location: ant shoulder  Pain description: bee sting  Aggravating factors: steri strips pull at times  Relieving factors: rest  PRECAUTIONS: None and Shoulder TSA precautions    WEIGHT BEARING RESTRICTIONS  None    FALLS:  Has patient fallen in last 6 months? No   LIVING ENVIRONMENT: Nothing pertinent  OCCUPATION: Retired   Hobbies: basketball    PLOF: Independent   PATIENT GOALS    To  improve functional use of his shoulder.      OBJECTIVE:    FOTO: 63 8/10    UPPER EXTREMITY ROM:    Active ROM Right 8/10  Shoulder flexion  125 p!  Shoulder extension  30  Shoulder abduction 122 p!   Shoulder adduction    Shoulder internal rotation Reach to side pocket p! In triceps   Shoulder external rotation 58   (Blank rows = not tested)      UPPER EXTREMITY MMT:   MMT Right 8/10  Shoulder flexion  4-/5  Shoulder extension    Shoulder abduction  4-/5  Shoulder adduction    Shoulder internal rotation 4-/5   Shoulder external rotation  4-/5               TODAY'S TREATMENT:   8/30  STM ant delt, biceps, pec major/minor  D1 ext 2x10 RTB D2 flexion RTB 2x10  3lb biceps and triceps 2x10 ER position wall slide 3s 10x Bilat shoulder ER 2x10 RTB Bent over row 5lbs 3x10 Standing scaption 2lbs 2x10  8/21  STM ant delt, biceps, pec major/minor  Supine pec stretch/fly 10s 5x ER position wall slide 3s 10x Horizontal ABD RTB 2x10 (R moving only) Bilat shoulder ER 2x10 YTB Bent over row 3lbs 2x10 YTB shoulder extension 2x10 (YTB broke during session, pt was not hit, pt denied any injury) Standing scaption 2lbs 2x10    8/17  Flexion, ABD, and ER with grade III AP and superior  mobs STM ant delt, biceps, pec major/minor  Doorway pec/biceps stretch 10s 5x Standing cane ER stretch 5s 10x Cane scaption stretch (too painful) Pulley scaption 56min30s   8/15   Flexion, ABD, and ER with grade III AP and superior  mobs  Supine press 3lbs 2x10 Bilat shoulder ER 2x10 YTB Bent over row 3lbs 2x10 Wall push up 10x             ABC's 2 lb 2x Supine protraction 2lb 3x10 Standing scaption 1lbs 2x10 Shoulder ABD short arm and long lever 10x each  8/10  PROM flexion and ER with grade III AP and superior  mobs  Supine press 3lbs 2x10 Bent over row 3lbs 2x10 Bent over T 2x10 no weight Wall ball 2x10 CW CCW and up down            ABC's 1 lb             SL  ER 1 lb 3x10  Standing active flexion 2x10  Row x20 red  Shoulder extension x20 red   7/27 Pulleys flexion and ABD- 40min PROM flexion and ER with grade III AP and superior  mobs Bent over row 2lbs 2x10 Bent over T 2x10 no weight Wall ball 2x10  CW CCW and up down            ABC's 1 lb             SL ER 1 lb 3x10  Standing active flexion 2x10  Row x20 red  Shoulder extension x20 red     7/25 Pulleys flexion and ABD- 42min PROM flexion and ER with grade III AP and superior  mobs Bent over row 2lbs 2x10 Bent over T 2x10 no weight Wall ball 2x10 CW CCW and up down            ABC's 1 lb             SL ER 1 lb 3x10  Standing active flexion 2x10  Row x20 red  Shoulder extension x20 red          PATIENT EDUCATION: Education details: anatomy, exercise progression, DOMS expectations, muscle firing,  envelope of function, HEP, POC Person educated: Patient Education method: Explanation, Demonstration, Tactile cues, Verbal cues, and Handouts Education comprehension: verbalized understanding, returned demonstration, and needs further education     HOME EXERCISE PROGRAM: Access Code [email or web address]     ASSESSMENT:   CLINICAL IMPRESSION: Pt with good tolerance to progression of strengthening for the R shoulder today without issue. Pt with minor anterior shoulder irritation when loaded exceeded tolerance above 90 deg of shoulder flexion. However, pt able to increase with scapular strengthening as well as tri-planar motions without issue. Pt had report of improvement in stiffness by end of session. Plan to continue with strength progression at reduced frequency to facilitate independent exercise. Pt would benefit from continued skilled therapy in order to reach goals and maximize functional R UE strength and ROM for full return to PLOF.     OBJECTIVE IMPAIRMENTS decreased mobility, decreased ROM, decreased strength, impaired UE functional use, and pain.    ACTIVITY LIMITATIONS carrying,  lifting, bathing, dressing, reach over head, and hygiene/grooming   PARTICIPATION LIMITATIONS: meal prep, cleaning, driving, and yard work   PERSONAL FACTORS 3+ comorbidities: Low Back DDD; Thyroid disorder; right carpal tunnel release  are also affecting patient's functional outcome.    REHAB POTENTIAL: Excellent   CLINICAL DECISION MAKING: Stable/uncomplicated   EVALUATION COMPLEXITY: Low     GOALS:    SHORT TERM GOALS: Target date: 03/20/2022    Patient will demonstrate 45 degrees of active right shoulder ER  Baseline: Goal status: MET   2.  Patient will be demonstrate 90 degrees of active right shoulder flexion  Baseline:  Goal status: MET   3.  Patient will be independent with basic HEP  Baseline:  Goal status: MET   LONG TERM GOALS: Target date: 08/05/2022  Patient will use his right arm for all ADL's  Baseline:  Goal status: ongoing   2.  Patient will return to drumming  Baseline:  Goal status: ongoing   3.  Patient will be independent with a full exercise program  Baseline:  Goal status: ongoing  4.  Pt will be able to reach [facility] and carry/hold >5 lbs in order to demonstrate functional improvement in R UE strength for return to PLOF and exercise.   Goal status: NEW      PLAN: PT FREQUENCY: 1-2x/week; dropping down to 1x every 2 wks   PT DURATION: 12 weeks (likely D/C in 8wks)    PLANNED INTERVENTIONS: Therapeutic exercises, Therapeutic activity, Neuromuscular re-education, Balance training, Gait training, Patient/Family education, Joint mobilization, Aquatic Therapy, Dry Needling, Cryotherapy, Moist heat, Taping, Ultrasound, and  Manual therapy   PLAN FOR NEXT SESSION:  Continue PROM, AAROM, isometrics, and stability exercise per protocol. Progress strengthening per protocol    Daleen Bo, PT 05/10/2022, 4:05 PM

## 2022-05-18 ENCOUNTER — Encounter (HOSPITAL_BASED_OUTPATIENT_CLINIC_OR_DEPARTMENT_OTHER): Payer: Self-pay | Admitting: Physical Therapy

## 2022-05-18 ENCOUNTER — Ambulatory Visit (HOSPITAL_BASED_OUTPATIENT_CLINIC_OR_DEPARTMENT_OTHER): Payer: 59 | Attending: Orthopedic Surgery | Admitting: Physical Therapy

## 2022-05-18 DIAGNOSIS — G8929 Other chronic pain: Secondary | ICD-10-CM | POA: Diagnosis not present

## 2022-05-18 DIAGNOSIS — M25511 Pain in right shoulder: Secondary | ICD-10-CM | POA: Insufficient documentation

## 2022-05-18 DIAGNOSIS — M25611 Stiffness of right shoulder, not elsewhere classified: Secondary | ICD-10-CM | POA: Insufficient documentation

## 2022-05-18 DIAGNOSIS — M6281 Muscle weakness (generalized): Secondary | ICD-10-CM | POA: Diagnosis not present

## 2022-05-18 NOTE — Therapy (Signed)
OUTPATIENT PHYSICAL THERAPY TREATMENT   Patient Name: Jason Norton MRN: [id number] DOB:10-24-1955, 66 y.o., male Today's Date: 05/18/2022   PT End of Session - 05/18/22 1006     Visit Number 17    Number of Visits 25    Date for PT Re-Evaluation 07/16/22    Authorization Type [facility] [employer]    PT Start Time 0935    PT Stop Time 1005    PT Time Calculation (min) 30 min    Activity Tolerance Patient tolerated treatment well    Behavior During Therapy WFL for tasks assessed/performed                       Past Medical History:  Diagnosis Date   Carpal tunnel syndrome    Clotting disorder (HCC)    Diverticulosis    DJD (degenerative joint disease)    Gout    Hyperlipemia    Hypothyroidism    Meningitis    at age 32 weeks old   PE (pulmonary embolism) 09/12/2003   hx post freq [id number]   Umbilical hernia    Past Surgical History:  Procedure Laterality Date   BACK SURGERY     lumb lam   CARPAL TUNNEL RELEASE  08/27/2012   Procedure: CARPAL TUNNEL RELEASE;  Surgeon: Roseanne Kaufman, MD;  Location: [place];  Service: Orthopedics;  Laterality: Right;  Right Limited Open Carpal Tunnel Release    COLONOSCOPY     I & D EXTREMITY  09/07/2012   Procedure: IRRIGATION AND DEBRIDEMENT EXTREMITY;  Surgeon: Linna Hoff, MD;  Location: [place];  Service: Orthopedics;  Laterality: Right;   INSERTION OF MESH N/A 05/12/2015   Procedure: INSERTION OF MESH;  Surgeon: Ralene Ok, MD;  Location: [place];  Service: General;  Laterality: N/A;   KNEE ARTHROPLASTY Right 09/11/1973   right knee reconstruction hign [id number]   KNEE ARTHROSCOPY Right    x3   REVERSE SHOULDER ARTHROPLASTY Right 01/24/2022   Procedure: RIGHT REVERSE SHOULDER ARTHROPLASTY;  Surgeon: Meredith Pel, MD;  Location: [place];  Service: Orthopedics;  Laterality: Right;   STERIOD INJECTION  08/27/2012   Procedure: STEROID INJECTION;  Surgeon: Roseanne Kaufman, MD;  Location: [place];  Service: Orthopedics;  Laterality: Left;   TOTAL HIP ARTHROPLASTY  09/11/2010   left   TOTAL KNEE ARTHROPLASTY  09/11/2005   rt   TOTAL KNEE REVISION  41/96/2229   rt   UMBILICAL HERNIA REPAIR N/A 05/12/2015   Procedure: LAPAROSCOPIC UMBILICAL HERNIA REPAIR;  Surgeon: Ralene Ok, MD;  Location: [place];  Service: General;  Laterality: N/A;   WRIST ARTHROSCOPY  09/11/2004   debrid-rt   Patient Active Problem List   Diagnosis Date Noted   Arthritis of right shoulder region    S/P reverse total shoulder arthroplasty, right 79/89/2119   Umbilical hernia 41/74/0814   Hematoma, postoperative 09/07/2012   Chronic anticoagulation 09/07/2012   GOUT 10/25/2007   History of pulmonary embolism 10/25/2007   DIARRHEA 10/25/2007   PERSONAL HISTORY OF ARTHRITIS 10/25/2007  PCP: Dr Domenick Gong    REFERRING PROVIDER: Dr Meredith Pel    REFERRING DIAG: Right Reverse Total Shoulder    THERAPY DIAG:  No diagnosis found.   Rationale for Evaluation and Treatment Rehabilitation   ONSET DATE: 01/24/2022   SUBJECTIVE:  SUBJECTIVE STATEMENT: Pt is 14wks out.   Pt states the shoudler feels good and is 8/10 in terms of normal.    PERTINENT HISTORY: LBP, Carpal tunnel, DJD, PE's Low back surgery,    PAIN:  Are you having pain? no: NPRS scale: 0/10 Pain location: ant shoulder  Pain description: bee sting  Aggravating factors: steri strips pull at times  Relieving factors: rest  PRECAUTIONS: None and Shoulder TSA precautions    WEIGHT BEARING RESTRICTIONS  None    FALLS:  Has patient fallen in last 6 months? No   LIVING ENVIRONMENT: Nothing pertinent  OCCUPATION: Retired   Hobbies: basketball    PLOF: Independent   PATIENT GOALS    To improve functional use of his  shoulder.      OBJECTIVE:    FOTO: 63 8/10    UPPER EXTREMITY ROM:    Active ROM Right 8/10  Shoulder flexion  125 p!  Shoulder extension  30  Shoulder abduction 122 p!   Shoulder adduction    Shoulder internal rotation Reach to side pocket p! In triceps   Shoulder external rotation 58   (Blank rows = not tested)      UPPER EXTREMITY MMT:   MMT Right 8/10  Shoulder flexion  4-/5  Shoulder extension    Shoulder abduction  4-/5  Shoulder adduction    Shoulder internal rotation 4-/5   Shoulder external rotation  4-/5               TODAY'S TREATMENT:   9/7  STM ant delt, biceps, pec major/minor Shoulder clocks grade III  Stars 5x RTB Wall push up 2x10 Bent over row double looped green band 2x10  Review of progressing weight to 5lbs with HEP and increasing resistance to continue with building strength  8/30  STM ant delt, biceps, pec major/minor  D1 ext 2x10 RTB D2 flexion RTB 2x10  3lb biceps and triceps 2x10 ER position wall slide 3s 10x Bilat shoulder ER 2x10 RTB Bent over row 5lbs 3x10 Standing scaption 2lbs 2x10  8/21  STM ant delt, biceps, pec major/minor  Supine pec stretch/fly 10s 5x ER position wall slide 3s 10x Horizontal ABD RTB 2x10 (R moving only) Bilat shoulder ER 2x10 YTB Bent over row 3lbs 2x10 YTB shoulder extension 2x10 (YTB broke during session, pt was not hit, pt denied any injury) Standing scaption 2lbs 2x10    PATIENT EDUCATION: Education details: anatomy, exercise progression, DOMS expectations, muscle firing,  envelope of function, HEP, POC Person educated: Patient Education method: Explanation, Demonstration, Tactile cues, Verbal cues, and Handouts Education comprehension: verbalized understanding, returned demonstration, and needs further education     HOME EXERCISE PROGRAM: Access Code [email or web address]     ASSESSMENT:   CLINICAL IMPRESSION:  Pt able to progress with strengthening at today's session without issue. Pt  has expected R shoulder stiffness with report of increased use during ADL with relief of muscle hypertonicity following manual therapy. Pt HEP updated at this time and pt advised to decrease frequency of visits in order to transition towards independent gym based strength. Plan to test/trial gym based exercise at next session if no issues with new HEP. Pt would benefit from continued skilled therapy in order to reach goals and maximize functional R UE strength and ROM for return to PLOF and ADL.   Pt would benefit from continued skilled therapy in order to reach goals and maximize functional R UE strength and ROM for full return to PLOF.  OBJECTIVE IMPAIRMENTS decreased mobility, decreased ROM, decreased strength, impaired UE functional use, and pain.    ACTIVITY LIMITATIONS carrying, lifting, bathing, dressing, reach over head, and hygiene/grooming   PARTICIPATION LIMITATIONS: meal prep, cleaning, driving, and yard work   PERSONAL FACTORS 3+ comorbidities: Low Back DDD; Thyroid disorder; right carpal tunnel release  are also affecting patient's functional outcome.    REHAB POTENTIAL: Excellent   CLINICAL DECISION MAKING: Stable/uncomplicated   EVALUATION COMPLEXITY: Low     GOALS:    SHORT TERM GOALS: Target date: 03/20/2022    Patient will demonstrate 45 degrees of active right shoulder ER  Baseline: Goal status: MET   2.  Patient will be demonstrate 90 degrees of active right shoulder flexion  Baseline:  Goal status: MET   3.  Patient will be independent with basic HEP  Baseline:  Goal status: MET   LONG TERM GOALS: Target date: 08/13/2022  Patient will use his right arm for all ADL's  Baseline:  Goal status: ongoing   2.  Patient will return to drumming  Baseline:  Goal status: ongoing   3.  Patient will be independent with a full exercise program  Baseline:  Goal status: ongoing  4.  Pt will be able to reach [facility] and carry/hold >5 lbs in order to demonstrate  functional improvement in R UE strength for return to PLOF and exercise.   Goal status: NEW      PLAN: PT FREQUENCY: 1-2x/week; dropping down to 1x every 2 wks   PT DURATION: 12 weeks (likely D/C in 8wks)    PLANNED INTERVENTIONS: Therapeutic exercises, Therapeutic activity, Neuromuscular re-education, Balance training, Gait training, Patient/Family education, Joint mobilization, Aquatic Therapy, Dry Needling, Cryotherapy, Moist heat, Taping, Ultrasound, and Manual therapy   PLAN FOR NEXT SESSION:  Continue/Progress strengthening per protocol    Daleen Bo, PT 05/18/2022, 10:12 AM

## 2022-05-22 ENCOUNTER — Encounter (HOSPITAL_BASED_OUTPATIENT_CLINIC_OR_DEPARTMENT_OTHER): Payer: 59 | Admitting: Physical Therapy

## 2022-05-29 ENCOUNTER — Other Ambulatory Visit (HOSPITAL_COMMUNITY): Payer: Self-pay

## 2022-05-29 MED ORDER — WEGOVY 2.4 MG/0.75ML ~~LOC~~ SOAJ
2.4000 mg | SUBCUTANEOUS | 5 refills | Status: DC
  Filled 2022-05-29: qty 3, 28d supply, fill #0
  Filled 2022-06-23: qty 3, 28d supply, fill #1
  Filled 2022-07-25: qty 3, 28d supply, fill #2
  Filled 2022-08-18: qty 3, 28d supply, fill #3
  Filled 2022-09-18 – 2022-09-19 (×3): qty 3, 28d supply, fill #4
  Filled 2022-10-18: qty 3, 28d supply, fill #5

## 2022-05-30 ENCOUNTER — Encounter (HOSPITAL_BASED_OUTPATIENT_CLINIC_OR_DEPARTMENT_OTHER): Payer: Self-pay | Admitting: Physical Therapy

## 2022-05-30 ENCOUNTER — Ambulatory Visit (HOSPITAL_BASED_OUTPATIENT_CLINIC_OR_DEPARTMENT_OTHER): Payer: 59 | Admitting: Physical Therapy

## 2022-05-30 DIAGNOSIS — M6281 Muscle weakness (generalized): Secondary | ICD-10-CM | POA: Diagnosis not present

## 2022-05-30 DIAGNOSIS — M25611 Stiffness of right shoulder, not elsewhere classified: Secondary | ICD-10-CM

## 2022-05-30 DIAGNOSIS — G8929 Other chronic pain: Secondary | ICD-10-CM

## 2022-05-30 DIAGNOSIS — M25511 Pain in right shoulder: Secondary | ICD-10-CM | POA: Diagnosis not present

## 2022-05-30 NOTE — Therapy (Signed)
OUTPATIENT PHYSICAL THERAPY TREATMENT   Patient Name: Jason Norton MRN: [id number] DOB:03-23-1956, 66 y.o., male Today's Date: 05/30/2022   PT End of Session - 05/30/22 0922     Visit Number 18    Number of Visits 25    Date for PT Re-Evaluation 07/16/22    Authorization Type [facility]    PT Start Time 0846    PT Stop Time 0919    PT Time Calculation (min) 33 min    Activity Tolerance Patient tolerated treatment well    Behavior During Therapy [facility] for tasks assessed/performed                        Past Medical History:  Diagnosis Date   Carpal tunnel syndrome    Clotting disorder (HCC)    Diverticulosis    DJD (degenerative joint disease)    Gout    Hyperlipemia    Hypothyroidism    Meningitis    at age 71 weeks old   PE (pulmonary embolism) 09/12/2003   hx post freq [id number]   Umbilical hernia    Past Surgical History:  Procedure Laterality Date   BACK SURGERY     lumb lam   CARPAL TUNNEL RELEASE  08/27/2012   Procedure: CARPAL TUNNEL RELEASE;  Surgeon: Roseanne Kaufman, MD;  Location: [place];  Service: Orthopedics;  Laterality: Right;  Right Limited Open Carpal Tunnel Release    COLONOSCOPY     I & D EXTREMITY  09/07/2012   Procedure: IRRIGATION AND DEBRIDEMENT EXTREMITY;  Surgeon: Linna Hoff, MD;  Location: [place];  Service: Orthopedics;  Laterality: Right;   INSERTION OF MESH N/A 05/12/2015   Procedure: INSERTION OF MESH;  Surgeon: Ralene Ok, MD;  Location: [place];  Service: General;  Laterality: N/A;   KNEE ARTHROPLASTY Right 09/11/1973   right knee reconstruction hign [id number]   KNEE ARTHROSCOPY Right    x3   REVERSE SHOULDER ARTHROPLASTY Right 01/24/2022   Procedure: RIGHT REVERSE SHOULDER ARTHROPLASTY;  Surgeon: Meredith Pel, MD;  Location: [place];  Service: Orthopedics;  Laterality: Right;   STERIOD INJECTION  08/27/2012   Procedure: STEROID INJECTION;  Surgeon: Roseanne Kaufman, MD;  Location: [place];  Service: Orthopedics;  Laterality: Left;   TOTAL HIP ARTHROPLASTY  09/11/2010   left   TOTAL KNEE ARTHROPLASTY  09/11/2005   rt   TOTAL KNEE REVISION  07/11/5944   rt   UMBILICAL HERNIA REPAIR N/A 05/12/2015   Procedure: LAPAROSCOPIC UMBILICAL HERNIA REPAIR;  Surgeon: Ralene Ok, MD;  Location: [place];  Service: General;  Laterality: N/A;   WRIST ARTHROSCOPY  09/11/2004   debrid-rt   Patient Active Problem List   Diagnosis Date Noted   Arthritis of right shoulder region    S/P reverse total shoulder arthroplasty, right 85/92/9244   Umbilical hernia 62/86/3817   Hematoma, postoperative 09/07/2012   Chronic anticoagulation 09/07/2012   GOUT 10/25/2007   History of pulmonary embolism 10/25/2007   DIARRHEA 10/25/2007   PERSONAL HISTORY OF ARTHRITIS 10/25/2007  PCP: Dr Domenick Gong    REFERRING PROVIDER: Dr Meredith Pel    REFERRING DIAG: Right Reverse Total Shoulder    THERAPY DIAG:  No diagnosis found.   Rationale for Evaluation and Treatment Rehabilitation   ONSET DATE: 01/24/2022   SUBJECTIVE:  SUBJECTIVE STATEMENT: Pt is 14+wks out.   Pt states the shoudler feels good. It feels almost normal except for strength in the shoulder.    PERTINENT HISTORY: LBP, Carpal tunnel, DJD, PE's Low back surgery,    PAIN:  Are you having pain? no: NPRS scale: 0/10 Pain location: ant shoulder  Pain description: bee sting  Aggravating factors: steri strips pull at times  Relieving factors: rest  PRECAUTIONS: None and Shoulder TSA precautions    WEIGHT BEARING RESTRICTIONS  None    FALLS:  Has patient fallen in last 6 months? No   LIVING ENVIRONMENT: Nothing pertinent  OCCUPATION: Retired   Hobbies: basketball    PLOF: Independent   PATIENT GOALS    To improve  functional use of his shoulder.      OBJECTIVE:    FOTO: 63 8/10    UPPER EXTREMITY ROM:    Active ROM Right 8/10  Shoulder flexion  125 p!  Shoulder extension  30  Shoulder abduction 122 p!   Shoulder adduction    Shoulder internal rotation Reach to side pocket p! In triceps   Shoulder external rotation 58   (Blank rows = not tested)      UPPER EXTREMITY MMT:   MMT Right 8/10  Shoulder flexion  4-/5  Shoulder extension    Shoulder abduction  4-/5  Shoulder adduction    Shoulder internal rotation 4-/5   Shoulder external rotation  4-/5               TODAY'S TREATMENT:  9/19  Seated cable row 25lbs 2x10 OH cable pull down (no greater than 160 flex) 2x10 5lbs Tricep press down rope 2x10 10lbs Chest press machine 2x10 10lbs (arms at neutral with body, mid width grip) Biceps curl machine 10lbs 2x10 (unable to do preacher curl due to L shoulder)  Edu about no OH motion/pressing machines at the gym for now; safety with gym exercise, slow introduction and focus on technique, alternating HEP and gym program provided  9/7  STM ant delt, biceps, pec major/minor Shoulder clocks grade III  Stars 5x RTB Wall push up 2x10 Bent over row double looped green band 2x10  Review of progressing weight to 5lbs with HEP and increasing resistance to continue with building strength  8/30  STM ant delt, biceps, pec major/minor  D1 ext 2x10 RTB D2 flexion RTB 2x10  3lb biceps and triceps 2x10 ER position wall slide 3s 10x Bilat shoulder ER 2x10 RTB Bent over row 5lbs 3x10 Standing scaption 2lbs 2x10  8/21  STM ant delt, biceps, pec major/minor  Supine pec stretch/fly 10s 5x ER position wall slide 3s 10x Horizontal ABD RTB 2x10 (R moving only) Bilat shoulder ER 2x10 YTB Bent over row 3lbs 2x10 YTB shoulder extension 2x10 (YTB broke during session, pt was not hit, pt denied any injury) Standing scaption 2lbs 2x10    PATIENT EDUCATION: Education details: anatomy,  exercise progression, DOMS expectations, muscle firing,  envelope of function, HEP, POC Person educated: Patient Education method: Explanation, Demonstration, Tactile cues, Verbal cues, and Handouts Education comprehension: verbalized understanding, returned demonstration, and needs further education     HOME EXERCISE PROGRAM: Access Code [email or web address]     ASSESSMENT:   CLINICAL IMPRESSION:  Pt tested single plane gym machines today with good success and no shoulder irritation when loaded was decreased and focus was on technique. Pt adivsed to alternate gym based program and HEP and slowly begin intro to gym based strengthening of the shoulder. Plan  to continue at decreased frequency of visits and progress gym based strength at next session. Plan to refer to personal training soon following therapy D/C. Pt would benefit from continued skilled therapy in order to reach goals and maximize functional R UE strength and ROM for return to PLOF and ADL.   Pt would benefit from continued skilled therapy in order to reach goals and maximize functional R UE strength and ROM for full return to PLOF.     OBJECTIVE IMPAIRMENTS decreased mobility, decreased ROM, decreased strength, impaired UE functional use, and pain.    ACTIVITY LIMITATIONS carrying, lifting, bathing, dressing, reach over head, and hygiene/grooming   PARTICIPATION LIMITATIONS: meal prep, cleaning, driving, and yard work   PERSONAL FACTORS 3+ comorbidities: Low Back DDD; Thyroid disorder; right carpal tunnel release  are also affecting patient's functional outcome.    REHAB POTENTIAL: Excellent   CLINICAL DECISION MAKING: Stable/uncomplicated   EVALUATION COMPLEXITY: Low     GOALS:    SHORT TERM GOALS: Target date: 03/20/2022    Patient will demonstrate 45 degrees of active right shoulder ER  Baseline: Goal status: MET   2.  Patient will be demonstrate 90 degrees of active right shoulder flexion  Baseline:  Goal status:  MET   3.  Patient will be independent with basic HEP  Baseline:  Goal status: MET   LONG TERM GOALS: Target date: 08/25/2022  Patient will use his right arm for all ADL's  Baseline:  Goal status: ongoing   2.  Patient will return to drumming  Baseline:  Goal status: ongoing   3.  Patient will be independent with a full exercise program  Baseline:  Goal status: ongoing  4.  Pt will be able to reach [facility] and carry/hold >5 lbs in order to demonstrate functional improvement in R UE strength for return to PLOF and exercise.   Goal status: NEW      PLAN: PT FREQUENCY: 1-2x/week; dropping down to 1x every 2 wks   PT DURATION: 12 weeks (likely D/C in 8wks)    PLANNED INTERVENTIONS: Therapeutic exercises, Therapeutic activity, Neuromuscular re-education, Balance training, Gait training, Patient/Family education, Joint mobilization, Aquatic Therapy, Dry Needling, Cryotherapy, Moist heat, Taping, Ultrasound, and Manual therapy   PLAN FOR NEXT SESSION:  Continue/Progress strengthening per protocol    Daleen Bo, PT 05/30/2022, 9:35 AM

## 2022-05-31 ENCOUNTER — Other Ambulatory Visit (HOSPITAL_COMMUNITY): Payer: Self-pay

## 2022-06-02 DIAGNOSIS — Z125 Encounter for screening for malignant neoplasm of prostate: Secondary | ICD-10-CM | POA: Diagnosis not present

## 2022-06-02 DIAGNOSIS — M109 Gout, unspecified: Secondary | ICD-10-CM | POA: Diagnosis not present

## 2022-06-02 DIAGNOSIS — E039 Hypothyroidism, unspecified: Secondary | ICD-10-CM | POA: Diagnosis not present

## 2022-06-02 DIAGNOSIS — R7301 Impaired fasting glucose: Secondary | ICD-10-CM | POA: Diagnosis not present

## 2022-06-02 DIAGNOSIS — R7989 Other specified abnormal findings of blood chemistry: Secondary | ICD-10-CM | POA: Diagnosis not present

## 2022-06-02 DIAGNOSIS — K1321 Leukoplakia of oral mucosa, including tongue: Secondary | ICD-10-CM | POA: Diagnosis not present

## 2022-06-02 DIAGNOSIS — E7800 Pure hypercholesterolemia, unspecified: Secondary | ICD-10-CM | POA: Diagnosis not present

## 2022-06-07 ENCOUNTER — Encounter (HOSPITAL_BASED_OUTPATIENT_CLINIC_OR_DEPARTMENT_OTHER): Payer: 59 | Admitting: Physical Therapy

## 2022-06-09 DIAGNOSIS — E7800 Pure hypercholesterolemia, unspecified: Secondary | ICD-10-CM | POA: Diagnosis not present

## 2022-06-09 DIAGNOSIS — K760 Fatty (change of) liver, not elsewhere classified: Secondary | ICD-10-CM | POA: Diagnosis not present

## 2022-06-09 DIAGNOSIS — D6859 Other primary thrombophilia: Secondary | ICD-10-CM | POA: Diagnosis not present

## 2022-06-09 DIAGNOSIS — Z1331 Encounter for screening for depression: Secondary | ICD-10-CM | POA: Diagnosis not present

## 2022-06-09 DIAGNOSIS — Z23 Encounter for immunization: Secondary | ICD-10-CM | POA: Diagnosis not present

## 2022-06-09 DIAGNOSIS — M19011 Primary osteoarthritis, right shoulder: Secondary | ICD-10-CM | POA: Diagnosis not present

## 2022-06-09 DIAGNOSIS — Z1339 Encounter for screening examination for other mental health and behavioral disorders: Secondary | ICD-10-CM | POA: Diagnosis not present

## 2022-06-09 DIAGNOSIS — Z0000 Encounter for general adult medical examination without abnormal findings: Secondary | ICD-10-CM | POA: Diagnosis not present

## 2022-06-09 DIAGNOSIS — N401 Enlarged prostate with lower urinary tract symptoms: Secondary | ICD-10-CM | POA: Diagnosis not present

## 2022-06-09 DIAGNOSIS — E039 Hypothyroidism, unspecified: Secondary | ICD-10-CM | POA: Diagnosis not present

## 2022-06-09 DIAGNOSIS — E6609 Other obesity due to excess calories: Secondary | ICD-10-CM | POA: Diagnosis not present

## 2022-06-09 DIAGNOSIS — M1990 Unspecified osteoarthritis, unspecified site: Secondary | ICD-10-CM | POA: Diagnosis not present

## 2022-06-13 ENCOUNTER — Ambulatory Visit (HOSPITAL_BASED_OUTPATIENT_CLINIC_OR_DEPARTMENT_OTHER): Payer: 59 | Attending: Orthopedic Surgery | Admitting: Physical Therapy

## 2022-06-13 ENCOUNTER — Encounter (HOSPITAL_BASED_OUTPATIENT_CLINIC_OR_DEPARTMENT_OTHER): Payer: Self-pay | Admitting: Physical Therapy

## 2022-06-13 DIAGNOSIS — M6281 Muscle weakness (generalized): Secondary | ICD-10-CM | POA: Insufficient documentation

## 2022-06-13 DIAGNOSIS — G8929 Other chronic pain: Secondary | ICD-10-CM | POA: Insufficient documentation

## 2022-06-13 DIAGNOSIS — M25611 Stiffness of right shoulder, not elsewhere classified: Secondary | ICD-10-CM | POA: Insufficient documentation

## 2022-06-13 DIAGNOSIS — M25511 Pain in right shoulder: Secondary | ICD-10-CM | POA: Diagnosis not present

## 2022-06-13 NOTE — Therapy (Signed)
OUTPATIENT PHYSICAL THERAPY TREATMENT   Patient Name: Jason Norton MRN: [id number] DOB:May 05, 1956, 66 y.o., male Today's Date: 06/13/2022   PT End of Session - 06/13/22 0924     Visit Number 19    Number of Visits 25    Date for PT Re-Evaluation 07/16/22    Authorization Type [facility]    PT Start Time 0845    PT Stop Time 0925    PT Time Calculation (min) 40 min    Activity Tolerance Patient tolerated treatment well    Behavior During Therapy [facility] for tasks assessed/performed                         Past Medical History:  Diagnosis Date   Carpal tunnel syndrome    Clotting disorder (HCC)    Diverticulosis    DJD (degenerative joint disease)    Gout    Hyperlipemia    Hypothyroidism    Meningitis    at age 43 weeks old   PE (pulmonary embolism) 09/12/2003   hx post freq [id number]   Umbilical hernia    Past Surgical History:  Procedure Laterality Date   BACK SURGERY     lumb lam   CARPAL TUNNEL RELEASE  08/27/2012   Procedure: CARPAL TUNNEL RELEASE;  Surgeon: Roseanne Kaufman, MD;  Location: [place];  Service: Orthopedics;  Laterality: Right;  Right Limited Open Carpal Tunnel Release    COLONOSCOPY     I & D EXTREMITY  09/07/2012   Procedure: IRRIGATION AND DEBRIDEMENT EXTREMITY;  Surgeon: Linna Hoff, MD;  Location: [place];  Service: Orthopedics;  Laterality: Right;   INSERTION OF MESH N/A 05/12/2015   Procedure: INSERTION OF MESH;  Surgeon: Ralene Ok, MD;  Location: [place];  Service: General;  Laterality: N/A;   KNEE ARTHROPLASTY Right 09/11/1973   right knee reconstruction hign [id number]   KNEE ARTHROSCOPY Right    x3   REVERSE SHOULDER ARTHROPLASTY Right 01/24/2022   Procedure: RIGHT REVERSE SHOULDER ARTHROPLASTY;  Surgeon: Meredith Pel, MD;  Location: [place];  Service: Orthopedics;  Laterality: Right;   STERIOD INJECTION  08/27/2012   Procedure: STEROID INJECTION;  Surgeon: Roseanne Kaufman, MD;  Location: [place];  Service: Orthopedics;  Laterality: Left;   TOTAL HIP ARTHROPLASTY  09/11/2010   left   TOTAL KNEE ARTHROPLASTY  09/11/2005   rt   TOTAL KNEE REVISION  68/11/2120   rt   UMBILICAL HERNIA REPAIR N/A 05/12/2015   Procedure: LAPAROSCOPIC UMBILICAL HERNIA REPAIR;  Surgeon: Ralene Ok, MD;  Location: [place];  Service: General;  Laterality: N/A;   WRIST ARTHROSCOPY  09/11/2004   debrid-rt   Patient Active Problem List   Diagnosis Date Noted   Arthritis of right shoulder region    S/P reverse total shoulder arthroplasty, right 48/25/0037   Umbilical hernia 04/88/8916   Hematoma, postoperative 09/07/2012   Chronic anticoagulation 09/07/2012   GOUT 10/25/2007   History of pulmonary embolism 10/25/2007   DIARRHEA 10/25/2007   PERSONAL HISTORY OF ARTHRITIS 10/25/2007  PCP: Dr Domenick Gong    REFERRING PROVIDER: Dr Meredith Pel    REFERRING DIAG: Right Reverse Total Shoulder    THERAPY DIAG:  No diagnosis found.   Rationale for Evaluation and Treatment Rehabilitation   ONSET DATE: 01/24/2022   SUBJECTIVE:  SUBJECTIVE STATEMENT: Pt is 14+wks out.   Pt states the shoudler feels good still. He wants to start gym based strength at today's session.    PERTINENT HISTORY: LBP, Carpal tunnel, DJD, PE's Low back surgery,    PAIN:  Are you having pain? no: NPRS scale: 0/10 Pain location: ant shoulder  Pain description: bee sting  Aggravating factors: steri strips pull at times  Relieving factors: rest  PRECAUTIONS: None and Shoulder TSA precautions    WEIGHT BEARING RESTRICTIONS  None    FALLS:  Has patient fallen in last 6 months? No   LIVING ENVIRONMENT: Nothing pertinent  OCCUPATION: Retired   Hobbies: basketball    PLOF: Independent   PATIENT GOALS    To  improve functional use of his shoulder.      OBJECTIVE:    FOTO: 63 8/10    UPPER EXTREMITY ROM:    Active ROM Right 8/10  Shoulder flexion  125 p!  Shoulder extension  30  Shoulder abduction 122 p!   Shoulder adduction    Shoulder internal rotation Reach to side pocket p! In triceps   Shoulder external rotation 58   (Blank rows = not tested)      UPPER EXTREMITY MMT:   MMT Right 8/10  Shoulder flexion  4-/5  Shoulder extension    Shoulder abduction  4-/5  Shoulder adduction    Shoulder internal rotation 4-/5   Shoulder external rotation  4-/5               TODAY'S TREATMENT:   10/3  Seated cable row 40lbs 2x10 OH cable pull down (no greater than 160 flex) 2x10 5lbs Tricep press down rope 2x10 10lbs Chest press machine 2x10 10lbs (arms at neutral with body, mid width grip) Biceps curl machine 10lbs 2x10 (unable to do preacher curl due to L shoulder) STS with weight 10lbs 5x White leg press 2x10 Sidestepping with RTB at thigh 10x   9/19  Seated cable row 25lbs 2x10 OH cable pull down (no greater than 160 flex) 2x10 5lbs Tricep press down rope 2x10 10lbs Chest press machine 2x10 10lbs (arms at neutral with body, mid width grip) Biceps curl machine 10lbs 2x10 (unable to do preacher curl due to L shoulder)  Edu about no OH motion/pressing machines at the gym for now; safety with gym exercise, slow introduction and focus on technique, alternating HEP and gym program provided  9/7  STM ant delt, biceps, pec major/minor Shoulder clocks grade III  Stars 5x RTB Wall push up 2x10 Bent over row double looped green band 2x10  Review of progressing weight to 5lbs with HEP and increasing resistance to continue with building strength  8/30  STM ant delt, biceps, pec major/minor  D1 ext 2x10 RTB D2 flexion RTB 2x10  3lb biceps and triceps 2x10 ER position wall slide 3s 10x Bilat shoulder ER 2x10 RTB Bent over row 5lbs 3x10 Standing scaption 2lbs  2x10  8/21  STM ant delt, biceps, pec major/minor  Supine pec stretch/fly 10s 5x ER position wall slide 3s 10x Horizontal ABD RTB 2x10 (R moving only) Bilat shoulder ER 2x10 YTB Bent over row 3lbs 2x10 YTB shoulder extension 2x10 (YTB broke during session, pt was not hit, pt denied any injury) Standing scaption 2lbs 2x10    PATIENT EDUCATION: Education details: anatomy, exercise progression, DOMS expectations, muscle firing,  envelope of function, HEP, POC Person educated: Patient Education method: Explanation, Demonstration, Tactile cues, Verbal cues, and Handouts Education comprehension: verbalized understanding,  returned demonstration, and needs further education     HOME EXERCISE PROGRAM: Access Code [email or web address]     ASSESSMENT:   CLINICAL IMPRESSION:  Pt session focused on progressing to gym based program. Pt without adverse reaction to last session and able to start machine/gym based strengthening. Pt given edu about all precautions with rev TSA as well as safety with gym based exercises for UE and LE. Plan to refer pt to personal training here at [facility] for transition to independent exercise. Pt without shoulder irritation during session. New HEP provided and all questions addressed regarding gym frequency and activity. Plan for full D/C at next. Pt would benefit from continued skilled therapy in order to reach goals and maximize functional R UE strength and ROM for return to PLOF and ADL.   Pt would benefit from continued skilled therapy in order to reach goals and maximize functional R UE strength and ROM for full return to PLOF.     OBJECTIVE IMPAIRMENTS decreased mobility, decreased ROM, decreased strength, impaired UE functional use, and pain.    ACTIVITY LIMITATIONS carrying, lifting, bathing, dressing, reach over head, and hygiene/grooming   PARTICIPATION LIMITATIONS: meal prep, cleaning, driving, and yard work   PERSONAL FACTORS 3+ comorbidities: Low Back DDD;  Thyroid disorder; right carpal tunnel release  are also affecting patient's functional outcome.    REHAB POTENTIAL: Excellent   CLINICAL DECISION MAKING: Stable/uncomplicated   EVALUATION COMPLEXITY: Low     GOALS:    SHORT TERM GOALS: Target date: 03/20/2022    Patient will demonstrate 45 degrees of active right shoulder ER  Baseline: Goal status: MET   2.  Patient will be demonstrate 90 degrees of active right shoulder flexion  Baseline:  Goal status: MET   3.  Patient will be independent with basic HEP  Baseline:  Goal status: MET   LONG TERM GOALS: Target date: 09/08/2022  Patient will use his right arm for all ADL's  Baseline:  Goal status: ongoing   2.  Patient will return to drumming  Baseline:  Goal status: ongoing   3.  Patient will be independent with a full exercise program  Baseline:  Goal status: ongoing  4.  Pt will be able to reach [facility] and carry/hold >5 lbs in order to demonstrate functional improvement in R UE strength for return to PLOF and exercise.   Goal status: NEW      PLAN: PT FREQUENCY: 1-2x/week; dropping down to 1x every 2 wks   PT DURATION: 12 weeks (likely D/C in 8wks)    PLANNED INTERVENTIONS: Therapeutic exercises, Therapeutic activity, Neuromuscular re-education, Balance training, Gait training, Patient/Family education, Joint mobilization, Aquatic Therapy, Dry Needling, Cryotherapy, Moist heat, Taping, Ultrasound, and Manual therapy   PLAN FOR NEXT SESSION:  D/C    Daleen Bo, PT 06/13/2022, 9:29 AM

## 2022-06-19 ENCOUNTER — Other Ambulatory Visit: Payer: Self-pay

## 2022-06-19 ENCOUNTER — Encounter (HOSPITAL_BASED_OUTPATIENT_CLINIC_OR_DEPARTMENT_OTHER): Payer: Self-pay | Admitting: Otolaryngology

## 2022-06-21 ENCOUNTER — Other Ambulatory Visit (HOSPITAL_COMMUNITY): Payer: Self-pay

## 2022-06-23 NOTE — H&P (Signed)
Chief complaint: Oral lesion.  HPI: Healthy gentleman, non-smoker, his dentist noticed a lesion on the right side roof of the mouth. It does not cause him any symptoms. Since then he has noticed some white patches along the right buccal mucosa. He is on anticoagulation for a pulmonary embolus he had many years ago. He has had multiple orthopedic surgeries and has been able to come off the anticoagulation as needed. He has had an extensive cardiac work-up and has no active cardiac disease.  PMH/Meds/All/SocHx/FamHx/ROS:   History reviewed. No pertinent past medical history.  Past Surgical History:  Procedure Laterality Date   TOTAL SHOULDER REPLACEMENT Right   No family history of bleeding disorders, wound healing problems or difficulty with anesthesia.   Social History   Socioeconomic History   Marital status: Married  Spouse name: Not on file   Number of children: Not on file   Years of education: Not on file   Highest education level: Not on file  Occupational History   Not on file  Tobacco Use   Smoking status: Former  Types: Cigarettes   Smokeless tobacco: Never  Substance and Sexual Activity   Alcohol use: Not Currently   Drug use: Not on file   Sexual activity: Not on file  Other Topics Concern   Not on file  Social History Narrative   Not on file   Social Determinants of Health   Financial Resource Strain: Not on file  Food Insecurity: Not on file  Transportation Needs: Not on file  Physical Activity: Not on file  Stress: Not on file  Social Connections: Not on file  Housing Stability: Not on file   Current Outpatient Medications:   allopurinoL (ZYLOPRIM) 300 MG tablet, , Disp: , Rfl:   cholecalciferol (VITAMIN D3) 1000 UNIT Tab, Take 1 tablet (1,000 Units total) by mouth., Disp: , Rfl:   colchicine, gout, 0.6 mg tablet, Take 1 tablet (0.6 mg total) by mouth., Disp: , Rfl:   doxycycline hyclate 100 MG tablet, , Disp: , Rfl:   ELIQUIS 5 mg tablet  *ANTICOAGULANT*, , Disp: , Rfl:   methocarbamoL (ROBAXIN) 500 MG tablet, , Disp: , Rfl:   rosuvastatin (CRESTOR) 5 MG tablet, , Disp: , Rfl:   semaglutide, weight loss, (WEGOVY) 1.7 mg/0.75 mL injection, Inject 0.75 mLs (1.7 mg total) into the skin., Disp: , Rfl:   SYNTHROID 100 mcg tablet, Take 1 tablet (100 mcg total) by mouth., Disp: , Rfl:   vitamin E 400 UNIT capsule, Take 1 capsule (400 Units total) by mouth., Disp: , Rfl:   A complete ROS was performed with pertinent positives/negatives noted in the HPI. The remainder of the ROS are negative.   Physical Exam:   There were no vitals taken for this visit.  General: Healthy and alert, in no distress, breathing easily. Normal affect. In a pleasant mood. Head: Normocephalic, atraumatic. No masses, or scars. Eyes: Pupils are equal, and reactive to light. Vision is grossly intact. No spontaneous or gaze nystagmus. Ears: Ear canals are clear. Tympanic membranes are intact, with normal landmarks and the middle ears are clear and healthy. Hearing: Grossly normal. Nose: Nasal cavities are clear with healthy mucosa, no polyps or exudate. Airways are patent. Face: No masses or scars, facial nerve function is symmetric. Oral Cavity: There is a streak of leukoplakia along the right buccal mucosa occlusal line, and a separate lesion circular, with leukoplakia and slight erythema about 1 cm or slightly greater than 1 cm along the posterior hard  palate mucosa. No other mucosal abnormalities are noted. Tongue with normal mobility. Dentition appears healthy. Oropharynx: Tonsils are symmetric. There are no mucosal masses identified. Tongue base appears normal and healthy. Larynx/Hypopharynx: deferred Chest: Deferred Neck: No palpable masses, no cervical adenopathy, no thyroid nodules or enlargement. Neuro: Cranial nerves II-XII with normal function. Balance: Normal gate. Other findings: none.  Independent Review of Additional Tests or Records:   none  Procedures:  none  Impression & Plans:  Oral leukoplakia, 2 separate lesions. Recommend biopsy of each and CO2 laser ablation. This can be done at the outpatient surgery center under local anesthetic. He understands and agrees.

## 2022-06-26 ENCOUNTER — Other Ambulatory Visit (HOSPITAL_COMMUNITY): Payer: Self-pay

## 2022-06-26 ENCOUNTER — Other Ambulatory Visit: Payer: Self-pay

## 2022-06-26 ENCOUNTER — Encounter (HOSPITAL_BASED_OUTPATIENT_CLINIC_OR_DEPARTMENT_OTHER)
Admission: RE | Disposition: A | Discharge status: Home / Self Care | Payer: Self-pay | Source: Home / Self Care | Attending: Otolaryngology

## 2022-06-26 ENCOUNTER — Ambulatory Visit (HOSPITAL_BASED_OUTPATIENT_CLINIC_OR_DEPARTMENT_OTHER): Payer: 59 | Admitting: Certified Registered"

## 2022-06-26 ENCOUNTER — Encounter (HOSPITAL_BASED_OUTPATIENT_CLINIC_OR_DEPARTMENT_OTHER): Payer: Self-pay | Admitting: Otolaryngology

## 2022-06-26 ENCOUNTER — Ambulatory Visit (HOSPITAL_BASED_OUTPATIENT_CLINIC_OR_DEPARTMENT_OTHER)
Admission: RE | Admit: 2022-06-26 | Discharge: 2022-06-26 | Disposition: A | Discharge status: Home / Self Care | Payer: 59 | Attending: Otolaryngology | Admitting: Otolaryngology

## 2022-06-26 DIAGNOSIS — Z87891 Personal history of nicotine dependence: Secondary | ICD-10-CM

## 2022-06-26 DIAGNOSIS — K1321 Leukoplakia of oral mucosa, including tongue: Secondary | ICD-10-CM

## 2022-06-26 DIAGNOSIS — Z01818 Encounter for other preprocedural examination: Secondary | ICD-10-CM

## 2022-06-26 DIAGNOSIS — Z86711 Personal history of pulmonary embolism: Secondary | ICD-10-CM | POA: Diagnosis not present

## 2022-06-26 DIAGNOSIS — K1379 Other lesions of oral mucosa: Secondary | ICD-10-CM | POA: Diagnosis not present

## 2022-06-26 DIAGNOSIS — Z7901 Long term (current) use of anticoagulants: Secondary | ICD-10-CM | POA: Insufficient documentation

## 2022-06-26 HISTORY — PX: EXCISION ORAL LESION WITH CO2 LASER: SHX6461

## 2022-06-26 SURGERY — DESTRUCTION, LESION, MOUTH, USING CO2 LASER
Anesthesia: General | Site: Mouth | Laterality: Right

## 2022-06-26 MED ORDER — OXYCODONE HCL 5 MG PO TABS: 5.0000 mg | ORAL_TABLET | Freq: Once | ORAL | Status: DC | PRN

## 2022-06-26 MED ORDER — DEXAMETHASONE SODIUM PHOSPHATE 4 MG/ML IJ SOLN
INTRAMUSCULAR | Status: DC | PRN
  Administered 2022-06-26: 10 mg via INTRAVENOUS

## 2022-06-26 MED ORDER — FENTANYL CITRATE (PF) 100 MCG/2ML IJ SOLN
25.0000 ug | INTRAMUSCULAR | Status: DC | PRN
  Administered 2022-06-26: 50 ug via INTRAVENOUS

## 2022-06-26 MED ORDER — AMISULPRIDE (ANTIEMETIC) 5 MG/2ML IV SOLN: 10.0000 mg | Freq: Once | INTRAVENOUS | Status: DC | PRN

## 2022-06-26 MED ORDER — LACTATED RINGERS IV SOLN: INTRAVENOUS | Status: DC

## 2022-06-26 MED ORDER — PROPOFOL 10 MG/ML IV BOLUS
INTRAVENOUS | Status: AC
  Filled 2022-06-26: qty 20

## 2022-06-26 MED ORDER — ACETAMINOPHEN 500 MG PO TABS
ORAL_TABLET | ORAL | Status: AC
  Filled 2022-06-26: qty 2

## 2022-06-26 MED ORDER — LIDOCAINE-EPINEPHRINE 1 %-1:100000 IJ SOLN
INTRAMUSCULAR | Status: DC | PRN
  Administered 2022-06-26: 1 mL

## 2022-06-26 MED ORDER — LIDOCAINE 2% (20 MG/ML) 5 ML SYRINGE
INTRAMUSCULAR | Status: AC
  Filled 2022-06-26: qty 5

## 2022-06-26 MED ORDER — FENTANYL CITRATE (PF) 100 MCG/2ML IJ SOLN
INTRAMUSCULAR | Status: DC | PRN
  Administered 2022-06-26 (×2): 50 ug via INTRAVENOUS

## 2022-06-26 MED ORDER — OXYCODONE HCL 5 MG/5ML PO SOLN: 5.0000 mg | Freq: Once | ORAL | Status: DC | PRN

## 2022-06-26 MED ORDER — 0.9 % SODIUM CHLORIDE (POUR BTL) OPTIME
TOPICAL | Status: DC | PRN
  Administered 2022-06-26: 500 mL

## 2022-06-26 MED ORDER — ACETAMINOPHEN 500 MG PO TABS
1000.0000 mg | ORAL_TABLET | Freq: Once | ORAL | Status: AC
  Administered 2022-06-26: 1000 mg via ORAL

## 2022-06-26 MED ORDER — FENTANYL CITRATE (PF) 100 MCG/2ML IJ SOLN
INTRAMUSCULAR | Status: AC
  Filled 2022-06-26: qty 2

## 2022-06-26 MED ORDER — LIDOCAINE HCL (CARDIAC) PF 100 MG/5ML IV SOSY
PREFILLED_SYRINGE | INTRAVENOUS | Status: DC | PRN
  Administered 2022-06-26: 100 mg via INTRAVENOUS

## 2022-06-26 MED ORDER — ONDANSETRON HCL 4 MG/2ML IJ SOLN
INTRAMUSCULAR | Status: AC
  Filled 2022-06-26: qty 2

## 2022-06-26 MED ORDER — ONDANSETRON HCL 4 MG/2ML IJ SOLN
INTRAMUSCULAR | Status: DC | PRN
  Administered 2022-06-26: 4 mg via INTRAVENOUS

## 2022-06-26 MED ORDER — PROPOFOL 10 MG/ML IV BOLUS
INTRAVENOUS | Status: DC | PRN
  Administered 2022-06-26: 200 mg via INTRAVENOUS

## 2022-06-26 MED ORDER — SUCCINYLCHOLINE CHLORIDE 200 MG/10ML IV SOSY
PREFILLED_SYRINGE | INTRAVENOUS | Status: DC | PRN
  Administered 2022-06-26: 100 mg via INTRAVENOUS

## 2022-06-26 SURGICAL SUPPLY — 36 items
BLADE SURG 15 STRL LF DISP TIS (BLADE) IMPLANT
BLADE SURG 15 STRL SS (BLADE)
BNDG EYE OVAL (GAUZE/BANDAGES/DRESSINGS) ×2 IMPLANT
CANISTER SUCT 1200ML W/VALVE (MISCELLANEOUS) ×1 IMPLANT
COAGULATOR SUCT SWTCH 10FR 6 (ELECTROSURGICAL) IMPLANT
COVER MAYO STAND STRL (DRAPES) ×1 IMPLANT
DEPRESSOR TONGUE 6 IN STERILE (GAUZE/BANDAGES/DRESSINGS) ×1 IMPLANT
ELECT COATED BLADE 2.86 ST (ELECTRODE) IMPLANT
ELECT REM PT RETURN 9FT ADLT (ELECTROSURGICAL)
ELECTRODE REM PT RTRN 9FT ADLT (ELECTROSURGICAL) IMPLANT
GAUZE SPONGE 4X4 12PLY STRL LF (GAUZE/BANDAGES/DRESSINGS) IMPLANT
GLOVE ECLIPSE 7.5 STRL STRAW (GLOVE) ×1 IMPLANT
GOWN STRL REUS W/ TWL LRG LVL3 (GOWN DISPOSABLE) ×1 IMPLANT
GOWN STRL REUS W/ TWL XL LVL3 (GOWN DISPOSABLE) ×1 IMPLANT
GOWN STRL REUS W/TWL LRG LVL3 (GOWN DISPOSABLE) ×1
GOWN STRL REUS W/TWL XL LVL3 (GOWN DISPOSABLE) ×1
MARKER SKIN DUAL TIP RULER LAB (MISCELLANEOUS) IMPLANT
NDL HYPO 27GX1-1/4 (NEEDLE) ×1 IMPLANT
NDL HYPO 30GX1 BEV (NEEDLE) IMPLANT
NEEDLE HYPO 27GX1-1/4 (NEEDLE) ×1 IMPLANT
NEEDLE HYPO 30GX1 BEV (NEEDLE) IMPLANT
NS IRRIG 1000ML POUR BTL (IV SOLUTION) ×1 IMPLANT
PACK BASIN DAY SURGERY FS (CUSTOM PROCEDURE TRAY) IMPLANT
PATTIES SURGICAL .5 X3 (DISPOSABLE) IMPLANT
PENCIL FOOT CONTROL (ELECTRODE) IMPLANT
PUNCH BIOPSY 4MM DISP (MISCELLANEOUS) IMPLANT
SHEET MEDIUM DRAPE 40X70 STRL (DRAPES) ×1 IMPLANT
SLEEVE SCD COMPRESS KNEE MED (STOCKING) IMPLANT
SUT CHROMIC 4 0 P 3 18 (SUTURE) IMPLANT
SUT SILK 3 0 PS 1 (SUTURE) IMPLANT
SUT VIC AB 3-0 SH 27 (SUTURE)
SUT VIC AB 3-0 SH 27X BRD (SUTURE) IMPLANT
SYR BULB EAR ULCER 3OZ GRN STR (SYRINGE) IMPLANT
SYR CONTROL 10ML LL (SYRINGE) IMPLANT
TOWEL GREEN STERILE FF (TOWEL DISPOSABLE) ×1 IMPLANT
TUBE CONNECTING 20X1/4 (TUBING) ×1 IMPLANT

## 2022-06-26 NOTE — Anesthesia Postprocedure Evaluation (Signed)
Anesthesia Post Note  Patient: Jason Norton  Procedure(s) Performed: ORAL BIOPSY WITH CO2 LASER ABLATION OF LESION (Right: Mouth)     Patient location during evaluation: PACU Anesthesia Type: General Level of consciousness: awake and alert Pain management: pain level controlled Vital Signs Assessment: post-procedure vital signs reviewed and stable Respiratory status: spontaneous breathing, nonlabored ventilation, respiratory function stable and patient connected to nasal cannula oxygen Cardiovascular status: blood pressure returned to baseline and stable Postop Assessment: no apparent nausea or vomiting Anesthetic complications: no   No notable events documented.  Last Vitals:  Vitals:   06/26/22 1145 06/26/22 1204  BP: 116/83 113/85  Pulse: 71 66  Resp: 14 12  Temp:  36.5 C  SpO2: 98% 96%    Last Pain:  Vitals:   06/26/22 1204  TempSrc: Oral  PainSc: 1                  Tiajuana Amass

## 2022-06-26 NOTE — Anesthesia Preprocedure Evaluation (Signed)
Anesthesia Evaluation  Patient identified by MRN, date of birth, ID band Patient awake    Reviewed: Allergy & Precautions, H&P , NPO status , Patient's Chart, lab work & pertinent test results  Airway Mallampati: II  TM Distance: >3 FB Neck ROM: Full    Dental no notable dental hx. (+) Teeth Intact, Dental Advisory Given   Pulmonary neg pulmonary ROS, former smoker,    Pulmonary exam normal breath sounds clear to auscultation       Cardiovascular Exercise Tolerance: Good negative cardio ROS   Rhythm:Regular Rate:Normal     Neuro/Psych negative neurological ROS  negative psych ROS   GI/Hepatic negative GI ROS, Neg liver ROS,   Endo/Other  Hypothyroidism   Renal/GU negative Renal ROS  negative genitourinary   Musculoskeletal  (+) Arthritis , Osteoarthritis,    Abdominal   Peds  Hematology negative hematology ROS (+)   Anesthesia Other Findings   Reproductive/Obstetrics negative OB ROS                             Anesthesia Physical  Anesthesia Plan  ASA: 2  Anesthesia Plan: General   Post-op Pain Management: Tylenol PO (pre-op)*   Induction: Intravenous  PONV Risk Score and Plan: 3 and Ondansetron, Dexamethasone and Midazolam  Airway Management Planned: Oral ETT  Additional Equipment:   Intra-op Plan:   Post-operative Plan: Extubation in OR  Informed Consent: I have reviewed the patients History and Physical, chart, labs and discussed the procedure including the risks, benefits and alternatives for the proposed anesthesia with the patient or authorized representative who has indicated his/her understanding and acceptance.     Dental advisory given  Plan Discussed with: CRNA  Anesthesia Plan Comments:         Anesthesia Quick Evaluation

## 2022-06-26 NOTE — Anesthesia Procedure Notes (Signed)
Procedure Name: Intubation Date/Time: 06/26/2022 10:57 AM  Performed by: Verita Lamb, CRNAPre-anesthesia Checklist: Patient identified, Emergency Drugs available, Suction available and Patient being monitored Patient Re-evaluated:Patient Re-evaluated prior to induction Oxygen Delivery Method: Circle system utilized Preoxygenation: Pre-oxygenation with 100% oxygen Induction Type: IV induction Ventilation: Mask ventilation without difficulty Laryngoscope Size: Mac and 4 Grade View: Grade I Tube type: Oral Tube size: 6.0 mm Number of attempts: 1 Airway Equipment and Method: Stylet and Oral airway (laser ett) Placement Confirmation: ETT inserted through vocal cords under direct vision, positive ETCO2, breath sounds checked- equal and bilateral and CO2 detector Tube secured with: Tape Dental Injury: Teeth and Oropharynx as per pre-operative assessment

## 2022-06-26 NOTE — Interval H&P Note (Signed)
History and Physical Interval Note:  06/26/2022 10:04 AM  Jason Norton  has presented today for surgery, with the diagnosis of Leukoplakia of oral mucosa.  The various methods of treatment have been discussed with the patient and family. After consideration of risks, benefits and other options for treatment, the patient has consented to  Procedure(s): ORAL BIOPSY WITH CO2 LASER ABLATION OF LESION (N/A) as a surgical intervention.  The patient's history has been reviewed, patient examined, no change in status, stable for surgery.  I have reviewed the patient's chart and labs.  Questions were answered to the patient's satisfaction.     Izora Gala

## 2022-06-26 NOTE — Transfer of Care (Signed)
Immediate Anesthesia Transfer of Care Note  Patient: Jason Norton  Procedure(s) Performed: ORAL BIOPSY WITH CO2 LASER ABLATION OF LESION (Right: Mouth)  Patient Location: PACU  Anesthesia Type:General  Level of Consciousness: awake, alert  and oriented  Airway & Oxygen Therapy: Patient Spontanous Breathing and Patient connected to face mask oxygen  Post-op Assessment: Report given to RN and Post -op Vital signs reviewed and stable  Post vital signs: Reviewed and stable  Last Vitals:  Vitals Value Taken Time  BP 127/89 06/26/22 1106  Temp    Pulse 72 06/26/22 1108  Resp 16 06/26/22 1108  SpO2 97 % 06/26/22 1108    Last Pain:  Vitals:   06/26/22 0936  TempSrc: Oral  PainSc: 0-No pain      Patients Stated Pain Goal: 4 (50/41/36 4383)  Complications: No notable events documented.

## 2022-06-26 NOTE — Op Note (Signed)
OPERATIVE REPORT  DATE OF SURGERY: 06/26/2022  PATIENT:  Jason Norton,  66 y.o. male  PRE-OPERATIVE DIAGNOSIS:  Leukoplakia of oral mucosa  POST-OPERATIVE DIAGNOSIS: Same  PROCEDURE:  Procedure(s): ORAL BIOPSY WITH CO2 LASER ABLATION OF LESION x2  SURGEON:  Beckie Salts, MD  ASSISTANTS: None  ANESTHESIA:   General   EBL: Less than 5 ml  DRAINS: None  LOCAL MEDICATIONS USED: 1% Xylocaine with epinephrine  SPECIMEN: 1.  Right palate mucosal biopsy 2.  Right buccal mucosal biopsy  COUNTS:  Correct  PROCEDURE DETAILS: The patient was taken to the operating room and placed on the operating table in the supine position. Following induction of general endotracheal anesthesia, using a laser safe endotracheal tube, the patient was draped in a standard fashion first with dry towels and then with saline soaked towels for laser protection.  Eye protection was used as well.  A mandible retractor was used throughout the procedure.  2 lesions were identified.  A 1.5 cm oval-shaped partially erythematous lesion in the right hard/soft palate junction.  An elongated area of leukoplakia with irregular borders in the right buccal mucosa, measuring approximately 2 cm in overall length.  Both lesions were infiltrated with local anesthetic solution.  A small biopsy forcep was used on each of these lesions which were then sent separately for pathologic evaluation.  The CO2 laser with handpiece was used at a setting of 3 W continuous power to ablate both lesions completely.  There was minimal bleeding.  Patient was then awakened extubated and transferred to recovery in stable condition.    PATIENT DISPOSITION:  To PACU, stable

## 2022-06-26 NOTE — Discharge Instructions (Addendum)
Diet as tolerated.  Rinse mouth with salt water couple of times daily.  Okay to use Tylenol/Motrin as needed.  Resume anticoagulation medication on Wednesday.   Post Anesthesia Home Care Instructions  Activity: Get plenty of rest for the remainder of the day. A responsible individual must stay with you for 24 hours following the procedure.  For the next 24 hours, DO NOT: -Drive a car -Paediatric nurse -Drink alcoholic beverages -Take any medication unless instructed by your physician -Make any legal decisions or sign important papers.  Meals: Start with liquid foods such as gelatin or soup. Progress to regular foods as tolerated. Avoid greasy, spicy, heavy foods. If nausea and/or vomiting occur, drink only clear liquids until the nausea and/or vomiting subsides. Call your physician if vomiting continues.  Special Instructions/Symptoms: Your throat may feel dry or sore from the anesthesia or the breathing tube placed in your throat during surgery. If this causes discomfort, gargle with warm salt water. The discomfort should disappear within 24 hours.  If you had a scopolamine patch placed behind your ear for the management of post- operative nausea and/or vomiting:  1. The medication in the patch is effective for 72 hours, after which it should be removed.  Wrap patch in a tissue and discard in the trash. Wash hands thoroughly with soap and water. 2. You may remove the patch earlier than 72 hours if you experience unpleasant side effects which may include dry mouth, dizziness or visual disturbances. 3. Avoid touching the patch. Wash your hands with soap and water after contact with the patch.     Post Anesthesia Home Care Instructions  Activity: Get plenty of rest for the remainder of the day. A responsible individual must stay with you for 24 hours following the procedure.  For the next 24 hours, DO NOT: -Drive a car -Paediatric nurse -Drink alcoholic beverages -Take any  medication unless instructed by your physician -Make any legal decisions or sign important papers.  Meals: Start with liquid foods such as gelatin or soup. Progress to regular foods as tolerated. Avoid greasy, spicy, heavy foods. If nausea and/or vomiting occur, drink only clear liquids until the nausea and/or vomiting subsides. Call your physician if vomiting continues.  Special Instructions/Symptoms: Your throat may feel dry or sore from the anesthesia or the breathing tube placed in your throat during surgery. If this causes discomfort, gargle with warm salt water. The discomfort should disappear within 24 hours.  If you had a scopolamine patch placed behind your ear for the management of post- operative nausea and/or vomiting:  1. The medication in the patch is effective for 72 hours, after which it should be removed.  Wrap patch in a tissue and discard in the trash. Wash hands thoroughly with soap and water. 2. You may remove the patch earlier than 72 hours if you experience unpleasant side effects which may include dry mouth, dizziness or visual disturbances. 3. Avoid touching the patch. Wash your hands with soap and water after contact with the patch.    Post Anesthesia Home Care Instructions  Activity: Get plenty of rest for the remainder of the day. A responsible individual must stay with you for 24 hours following the procedure.  For the next 24 hours, DO NOT: -Drive a car -Paediatric nurse -Drink alcoholic beverages -Take any medication unless instructed by your physician -Make any legal decisions or sign important papers.  Meals: Start with liquid foods such as gelatin or soup. Progress to regular foods as tolerated. Avoid  greasy, spicy, heavy foods. If nausea and/or vomiting occur, drink only clear liquids until the nausea and/or vomiting subsides. Call your physician if vomiting continues.  Special Instructions/Symptoms: Your throat may feel dry or sore from the  anesthesia or the breathing tube placed in your throat during surgery. If this causes discomfort, gargle with warm salt water. The discomfort should disappear within 24 hours.  If you had a scopolamine patch placed behind your ear for the management of post- operative nausea and/or vomiting:  1. The medication in the patch is effective for 72 hours, after which it should be removed.  Wrap patch in a tissue and discard in the trash. Wash hands thoroughly with soap and water. 2. You may remove the patch earlier than 72 hours if you experience unpleasant side effects which may include dry mouth, dizziness or visual disturbances. 3. Avoid touching the patch. Wash your hands with soap and water after contact with the patch.  May have Tylenol today at 3:45pm 06/26/22

## 2022-06-27 ENCOUNTER — Encounter (HOSPITAL_BASED_OUTPATIENT_CLINIC_OR_DEPARTMENT_OTHER): Payer: Self-pay | Admitting: Otolaryngology

## 2022-06-27 LAB — SURGICAL PATHOLOGY

## 2022-07-04 ENCOUNTER — Encounter (HOSPITAL_BASED_OUTPATIENT_CLINIC_OR_DEPARTMENT_OTHER): Payer: 59 | Admitting: Physical Therapy

## 2022-07-13 ENCOUNTER — Ambulatory Visit (HOSPITAL_BASED_OUTPATIENT_CLINIC_OR_DEPARTMENT_OTHER): Payer: 59 | Attending: Orthopedic Surgery | Admitting: Physical Therapy

## 2022-07-13 ENCOUNTER — Encounter (HOSPITAL_BASED_OUTPATIENT_CLINIC_OR_DEPARTMENT_OTHER): Payer: Self-pay | Admitting: Physical Therapy

## 2022-07-13 ENCOUNTER — Other Ambulatory Visit (HOSPITAL_COMMUNITY): Payer: Self-pay

## 2022-07-13 DIAGNOSIS — G8929 Other chronic pain: Secondary | ICD-10-CM | POA: Insufficient documentation

## 2022-07-13 DIAGNOSIS — M25511 Pain in right shoulder: Secondary | ICD-10-CM | POA: Insufficient documentation

## 2022-07-13 DIAGNOSIS — M25611 Stiffness of right shoulder, not elsewhere classified: Secondary | ICD-10-CM | POA: Insufficient documentation

## 2022-07-13 DIAGNOSIS — M6281 Muscle weakness (generalized): Secondary | ICD-10-CM | POA: Diagnosis not present

## 2022-07-13 NOTE — Therapy (Signed)
OUTPATIENT PHYSICAL THERAPY TREATMENT  PHYSICAL THERAPY DISCHARGE SUMMARY  Visits from Start of Care: 20  Plan: Patient agrees to discharge.  Patient goals were met. Patient is being discharged due to meeting the stated rehab goals.         Patient Name: Jason Norton MRN: [id number] DOB:Mar 27, 1956, 66 y.o., male Today's Date: 07/13/2022   PT End of Session - 07/13/22 0829     Visit Number 20    Number of Visits 25    Date for PT Re-Evaluation 07/16/22    Authorization Type [facility]    PT Start Time 0807    PT Stop Time 0825    PT Time Calculation (min) 18 min    Activity Tolerance Patient tolerated treatment well    Behavior During Therapy [facility] for tasks assessed/performed                          Past Medical History:  Diagnosis Date   Carpal tunnel syndrome    Clotting disorder (HCC)    Diverticulosis    DJD (degenerative joint disease)    Gout    Hyperlipemia    Hypothyroidism    Meningitis    at age 72 weeks old   PE (pulmonary embolism) 09/12/2003   hx post freq [id number]   Umbilical hernia    Past Surgical History:  Procedure Laterality Date   BACK SURGERY     lumb lam   CARPAL TUNNEL RELEASE  08/27/2012   Procedure: CARPAL TUNNEL RELEASE;  Surgeon: Roseanne Kaufman, MD;  Location: [place];  Service: Orthopedics;  Laterality: Right;  Right Limited Open Carpal Tunnel Release    COLONOSCOPY     EXCISION ORAL LESION WITH CO2 LASER Right 06/26/2022   Procedure: ORAL BIOPSY WITH CO2 LASER ABLATION OF LESION;  Surgeon: Izora Gala, MD;  Location: [place];  Service: ENT;  Laterality: Right;   I & D EXTREMITY  09/07/2012   Procedure: IRRIGATION AND DEBRIDEMENT EXTREMITY;  Surgeon: Linna Hoff, MD;  Location: [place];  Service: Orthopedics;  Laterality: Right;   INSERTION OF MESH N/A 05/12/2015   Procedure: INSERTION OF MESH;  Surgeon: Ralene Ok, MD;  Location: [place];  Service: General;  Laterality:  N/A;   KNEE ARTHROPLASTY Right 09/11/1973   right knee reconstruction hign [id number]   KNEE ARTHROSCOPY Right    x3   REVERSE SHOULDER ARTHROPLASTY Right 01/24/2022   Procedure: RIGHT REVERSE SHOULDER ARTHROPLASTY;  Surgeon: Meredith Pel, MD;  Location: [place];  Service: Orthopedics;  Laterality: Right;   STERIOD INJECTION  08/27/2012   Procedure: STEROID INJECTION;  Surgeon: Roseanne Kaufman, MD;  Location: [place];  Service: Orthopedics;  Laterality: Left;   TOTAL HIP ARTHROPLASTY  09/11/2010   left   TOTAL KNEE ARTHROPLASTY  09/11/2005   rt   TOTAL KNEE REVISION  00/37/0488   rt   UMBILICAL HERNIA REPAIR N/A 05/12/2015   Procedure: LAPAROSCOPIC UMBILICAL HERNIA REPAIR;  Surgeon: Ralene Ok, MD;  Location: [place];  Service: General;  Laterality: N/A;   WRIST ARTHROSCOPY  09/11/2004   debrid-rt   Patient Active Problem List   Diagnosis Date Noted   Arthritis of right shoulder region    S/P reverse total shoulder arthroplasty, right 89/16/9450   Umbilical hernia 38/88/2800   Hematoma, postoperative 09/07/2012   Chronic anticoagulation 09/07/2012   GOUT 10/25/2007   History of pulmonary embolism 10/25/2007   DIARRHEA  10/25/2007   PERSONAL HISTORY OF ARTHRITIS 10/25/2007  PCP: Dr Domenick Gong    REFERRING PROVIDER: Dr Meredith Pel    REFERRING DIAG: Right Reverse Total Shoulder    THERAPY DIAG:  No diagnosis found.   Rationale for Evaluation and Treatment Rehabilitation   ONSET DATE: 01/24/2022   SUBJECTIVE:                                                                                                                                                                                       SUBJECTIVE STATEMENT:  Pt states the shoudler feels good still. He has been using it without thinking about it. He feels he is ready to start gym exercise and personal training independently.    PERTINENT HISTORY: LBP, Carpal tunnel, DJD, PE's Low  back surgery,    PAIN:  Are you having pain? no: NPRS scale: 0/10 Pain location: ant shoulder  Pain description: bee sting  Aggravating factors: steri strips pull at times  Relieving factors: rest  PRECAUTIONS: None and Shoulder TSA precautions    WEIGHT BEARING RESTRICTIONS  None    FALLS:  Has patient fallen in last 6 months? No   LIVING ENVIRONMENT: Nothing pertinent  OCCUPATION: Retired   Hobbies: basketball    PLOF: Independent   PATIENT GOALS    To improve functional use of his shoulder.      OBJECTIVE:    FOTO: 63 8/10 FOTO: 74 11/2     UPPER EXTREMITY ROM:    Active ROM Right 8/10 11/2  Shoulder flexion  125 p! 145  Shoulder extension  30 45  Shoulder abduction 122 p!  145  Shoulder adduction     Shoulder internal rotation Reach to side pocket p! In triceps  SIJ  Shoulder external rotation 58  57  (Blank rows = not tested)      UPPER EXTREMITY MMT:   MMT Right 8/10 11/2  Shoulder flexion  4-/5 4+/5  Shoulder extension     Shoulder abduction  4-/5 4+/5  Shoulder adduction     Shoulder internal rotation 4-/5  4+/5  Shoulder external rotation  4-/5 4+/5               TODAY'S TREATMENT:   11/2 Review of HEP and precautions with lifting  Program Notes Chest press Machine #9 10lbs 3x10 (to netural only, elbows do no go past body)No OH pressing, no lat pull downs  Exercises - Seated Row Cable Machine  - 1 x daily - 2-3 x weekly - 3 sets - 10 reps - Triceps Extension   - 1 x daily - 2-3 x weekly - 3 sets -  10 reps - Standing Bicep Curls Supinated with Dumbbells  - 1 x daily - 2-3 x weekly - 3 sets - 10 reps - Full Leg Press  - 1 x daily - 2-3 x weekly - 3 sets - 10 reps - Hamstring Curl with Weight Machine  - 1 x daily - 2-3 x weekly - 3 sets - 10 reps - Side Stepping with Resistance at Thighs  - 1 x daily - 2-3 x weekly - 3 sets - 10 reps - Heel Raises with Leg Press  - 1 x daily - 2-3 x weekly - 3 sets - 10 reps  10/3  Seated cable  row 40lbs 2x10 OH cable pull down (no greater than 160 flex) 2x10 5lbs Tricep press down rope 2x10 10lbs Chest press machine 2x10 10lbs (arms at neutral with body, mid width grip) Biceps curl machine 10lbs 2x10 (unable to do preacher curl due to L shoulder) STS with weight 10lbs 5x White leg press 2x10 Sidestepping with RTB at thigh 10x   9/19  Seated cable row 25lbs 2x10 OH cable pull down (no greater than 160 flex) 2x10 5lbs Tricep press down rope 2x10 10lbs Chest press machine 2x10 10lbs (arms at neutral with body, mid width grip) Biceps curl machine 10lbs 2x10 (unable to do preacher curl due to L shoulder)  Edu about no OH motion/pressing machines at the gym for now; safety with gym exercise, slow introduction and focus on technique, alternating HEP and gym program provided  9/7  STM ant delt, biceps, pec major/minor Shoulder clocks grade III  Stars 5x RTB Wall push up 2x10 Bent over row double looped green band 2x10  Review of progressing weight to 5lbs with HEP and increasing resistance to continue with building strength  8/30  STM ant delt, biceps, pec major/minor  D1 ext 2x10 RTB D2 flexion RTB 2x10  3lb biceps and triceps 2x10 ER position wall slide 3s 10x Bilat shoulder ER 2x10 RTB Bent over row 5lbs 3x10 Standing scaption 2lbs 2x10  8/21  STM ant delt, biceps, pec major/minor  Supine pec stretch/fly 10s 5x ER position wall slide 3s 10x Horizontal ABD RTB 2x10 (R moving only) Bilat shoulder ER 2x10 YTB Bent over row 3lbs 2x10 YTB shoulder extension 2x10 (YTB broke during session, pt was not hit, pt denied any injury) Standing scaption 2lbs 2x10    PATIENT EDUCATION: Education details: exam findings, independent exercise, exercise taper, D/C plan, exercise progression, HEP, POC Person educated: Patient Education method: Explanation, Demonstration, Tactile cues, Verbal cues, and Handouts Education comprehension: verbalized understanding, returned  demonstration, and needs further education     HOME EXERCISE PROGRAM: Access Code [email or web address]     ASSESSMENT:   CLINICAL IMPRESSION:  Pt has met and exceeded goals at this time for R reverse TSA. Pt has improved significantly with ROM and strength and has returned to full ADL. D/C this episode of care at this time. Pt has progressed very very well and will transition over to independent exercise with personal training referral from PT.   Pt would benefit from continued skilled therapy in order to reach goals and maximize functional R UE strength and ROM for full return to PLOF.     OBJECTIVE IMPAIRMENTS decreased mobility, decreased ROM, decreased strength, impaired UE functional use, and pain.    ACTIVITY LIMITATIONS carrying, lifting, bathing, dressing, reach over head, and hygiene/grooming   PARTICIPATION LIMITATIONS: meal prep, cleaning, driving, and yard work   PERSONAL FACTORS 3+ comorbidities: Low Back DDD;  Thyroid disorder; right carpal tunnel release  are also affecting patient's functional outcome.    REHAB POTENTIAL: Excellent   CLINICAL DECISION MAKING: Stable/uncomplicated   EVALUATION COMPLEXITY: Low     GOALS:    SHORT TERM GOALS: Target date: 03/20/2022    Patient will demonstrate 45 degrees of active right shoulder ER  Baseline: Goal status: MET   2.  Patient will be demonstrate 90 degrees of active right shoulder flexion  Baseline:  Goal status: MET   3.  Patient will be independent with basic HEP  Baseline:  Goal status: MET   LONG TERM GOALS: Target date: 10/08/2022  Patient will use his right arm for all ADL's  Baseline:  Goal status: met   2.  Patient will return to drumming  Baseline:  Goal status: met   3.  Patient will be independent with a full exercise program  Baseline:  Goal status: MET  4.  Pt will be able to reach [facility] and carry/hold >5 lbs in order to demonstrate functional improvement in R UE strength for return to PLOF and  exercise.   Goal status: MET      PLAN: PT FREQUENCY: 1-2x/week; dropping down to 1x every 2 wks   PT DURATION: 12 weeks (likely D/C in 8wks)    PLANNED INTERVENTIONS: Therapeutic exercises, Therapeutic activity, Neuromuscular re-education, Balance training, Gait training, Patient/Family education, Joint mobilization, Aquatic Therapy, Dry Needling, Cryotherapy, Moist heat, Taping, Ultrasound, and Manual therapy   PLAN FOR NEXT SESSION:  D/C    Daleen Bo, PT 07/13/2022, 8:32 AM

## 2022-07-25 ENCOUNTER — Other Ambulatory Visit (HOSPITAL_COMMUNITY): Payer: Self-pay

## 2022-08-14 ENCOUNTER — Other Ambulatory Visit (HOSPITAL_COMMUNITY): Payer: Self-pay

## 2022-08-20 ENCOUNTER — Other Ambulatory Visit (HOSPITAL_COMMUNITY): Payer: Self-pay

## 2022-08-21 ENCOUNTER — Other Ambulatory Visit (HOSPITAL_COMMUNITY): Payer: Self-pay

## 2022-08-21 MED ORDER — SYNTHROID 100 MCG PO TABS
100.0000 ug | ORAL_TABLET | Freq: Every morning | ORAL | 12 refills | Status: DC
  Filled 2022-08-21: qty 30, 30d supply, fill #0
  Filled 2022-12-07: qty 30, 30d supply, fill #1

## 2022-08-28 ENCOUNTER — Other Ambulatory Visit: Payer: Self-pay

## 2022-08-28 ENCOUNTER — Other Ambulatory Visit (HOSPITAL_COMMUNITY): Payer: Self-pay

## 2022-08-28 MED ORDER — ELIQUIS 5 MG PO TABS
5.0000 mg | ORAL_TABLET | Freq: Two times a day (BID) | ORAL | 4 refills | Status: DC
  Filled 2022-08-28: qty 180, 90d supply, fill #0
  Filled 2022-12-07: qty 180, 90d supply, fill #1
  Filled 2023-04-02: qty 180, 90d supply, fill #2
  Filled 2023-07-12: qty 180, 90d supply, fill #3

## 2022-08-29 ENCOUNTER — Other Ambulatory Visit (HOSPITAL_COMMUNITY): Payer: Self-pay

## 2022-08-29 MED ORDER — DOXYCYCLINE HYCLATE 100 MG PO CAPS
100.0000 mg | ORAL_CAPSULE | Freq: Two times a day (BID) | ORAL | 1 refills | Status: DC
  Filled 2022-08-29: qty 180, 90d supply, fill #0

## 2022-08-30 ENCOUNTER — Other Ambulatory Visit (HOSPITAL_COMMUNITY): Payer: Self-pay

## 2022-08-30 MED ORDER — ALLOPURINOL 300 MG PO TABS
300.0000 mg | ORAL_TABLET | Freq: Every day | ORAL | 4 refills | Status: DC
  Filled 2022-08-30: qty 90, 90d supply, fill #0
  Filled 2022-12-07: qty 90, 90d supply, fill #1
  Filled 2023-04-02: qty 90, 90d supply, fill #2
  Filled 2023-07-12: qty 90, 90d supply, fill #3

## 2022-09-10 DIAGNOSIS — Z760 Encounter for issue of repeat prescription: Secondary | ICD-10-CM | POA: Diagnosis not present

## 2022-09-19 ENCOUNTER — Other Ambulatory Visit (HOSPITAL_COMMUNITY): Payer: Self-pay

## 2022-10-09 ENCOUNTER — Other Ambulatory Visit (HOSPITAL_COMMUNITY): Payer: Self-pay

## 2022-10-09 MED ORDER — FINASTERIDE 5 MG PO TABS
5.0000 mg | ORAL_TABLET | Freq: Every day | ORAL | 3 refills | Status: DC
  Filled 2022-10-09: qty 90, 90d supply, fill #0
  Filled 2023-01-04: qty 90, 90d supply, fill #1
  Filled 2023-04-02: qty 90, 90d supply, fill #2
  Filled 2023-07-12: qty 90, 90d supply, fill #3

## 2022-10-18 ENCOUNTER — Other Ambulatory Visit (HOSPITAL_COMMUNITY): Payer: Self-pay

## 2022-10-18 MED ORDER — DOXYCYCLINE HYCLATE 100 MG PO CAPS
100.0000 mg | ORAL_CAPSULE | Freq: Two times a day (BID) | ORAL | 1 refills | Status: DC
  Filled 2022-10-18 – 2022-12-07 (×2): qty 180, 90d supply, fill #0
  Filled 2023-04-02: qty 180, 90d supply, fill #1

## 2022-11-09 ENCOUNTER — Other Ambulatory Visit (HOSPITAL_COMMUNITY): Payer: Self-pay

## 2022-11-09 MED ORDER — WEGOVY 2.4 MG/0.75ML ~~LOC~~ SOAJ
2.4000 mg | SUBCUTANEOUS | 5 refills | Status: DC
  Filled 2022-11-09: qty 3, 28d supply, fill #0
  Filled 2022-12-07: qty 3, 28d supply, fill #1
  Filled 2023-01-04: qty 3, 28d supply, fill #2
  Filled 2023-02-08 (×2): qty 3, 28d supply, fill #3

## 2022-11-29 DIAGNOSIS — D6859 Other primary thrombophilia: Secondary | ICD-10-CM | POA: Diagnosis not present

## 2022-11-29 DIAGNOSIS — E039 Hypothyroidism, unspecified: Secondary | ICD-10-CM | POA: Diagnosis not present

## 2022-11-29 DIAGNOSIS — M109 Gout, unspecified: Secondary | ICD-10-CM | POA: Diagnosis not present

## 2022-11-29 DIAGNOSIS — K760 Fatty (change of) liver, not elsewhere classified: Secondary | ICD-10-CM | POA: Diagnosis not present

## 2022-11-29 DIAGNOSIS — N401 Enlarged prostate with lower urinary tract symptoms: Secondary | ICD-10-CM | POA: Diagnosis not present

## 2022-11-29 DIAGNOSIS — M1990 Unspecified osteoarthritis, unspecified site: Secondary | ICD-10-CM | POA: Diagnosis not present

## 2022-11-29 DIAGNOSIS — R7301 Impaired fasting glucose: Secondary | ICD-10-CM | POA: Diagnosis not present

## 2022-11-29 DIAGNOSIS — E7800 Pure hypercholesterolemia, unspecified: Secondary | ICD-10-CM | POA: Diagnosis not present

## 2022-11-29 DIAGNOSIS — L649 Androgenic alopecia, unspecified: Secondary | ICD-10-CM | POA: Diagnosis not present

## 2022-11-29 DIAGNOSIS — L719 Rosacea, unspecified: Secondary | ICD-10-CM | POA: Diagnosis not present

## 2022-12-07 ENCOUNTER — Other Ambulatory Visit (HOSPITAL_COMMUNITY): Payer: Self-pay

## 2022-12-07 MED ORDER — LEVOTHYROXINE SODIUM 100 MCG PO TABS
100.0000 ug | ORAL_TABLET | Freq: Every morning | ORAL | 12 refills | Status: DC
  Filled 2022-12-07: qty 30, 30d supply, fill #0
  Filled 2023-01-04: qty 30, 30d supply, fill #1
  Filled 2023-02-08 – 2023-02-22 (×2): qty 30, 30d supply, fill #2
  Filled 2023-04-02: qty 30, 30d supply, fill #3
  Filled 2023-05-07: qty 30, 30d supply, fill #4
  Filled 2023-06-12: qty 30, 30d supply, fill #5

## 2022-12-08 ENCOUNTER — Other Ambulatory Visit (HOSPITAL_COMMUNITY): Payer: Self-pay

## 2023-01-18 DIAGNOSIS — E039 Hypothyroidism, unspecified: Secondary | ICD-10-CM | POA: Diagnosis not present

## 2023-01-18 DIAGNOSIS — E7800 Pure hypercholesterolemia, unspecified: Secondary | ICD-10-CM | POA: Diagnosis not present

## 2023-02-08 ENCOUNTER — Other Ambulatory Visit: Payer: Self-pay

## 2023-02-09 ENCOUNTER — Other Ambulatory Visit (HOSPITAL_COMMUNITY): Payer: Self-pay

## 2023-02-20 ENCOUNTER — Other Ambulatory Visit (HOSPITAL_COMMUNITY): Payer: Self-pay

## 2023-02-22 ENCOUNTER — Other Ambulatory Visit (HOSPITAL_COMMUNITY): Payer: Self-pay

## 2023-04-02 ENCOUNTER — Other Ambulatory Visit (HOSPITAL_COMMUNITY): Payer: Self-pay

## 2023-04-03 ENCOUNTER — Other Ambulatory Visit (HOSPITAL_COMMUNITY): Payer: Self-pay

## 2023-04-03 MED ORDER — ROSUVASTATIN CALCIUM 5 MG PO TABS
5.0000 mg | ORAL_TABLET | Freq: Every day | ORAL | 4 refills | Status: DC
  Filled 2023-04-03: qty 90, 90d supply, fill #0
  Filled 2023-05-07 – 2023-07-12 (×2): qty 90, 90d supply, fill #1
  Filled 2023-10-04: qty 90, 90d supply, fill #2
  Filled 2024-01-04: qty 90, 90d supply, fill #3

## 2023-05-07 ENCOUNTER — Other Ambulatory Visit (HOSPITAL_COMMUNITY): Payer: Self-pay

## 2023-05-15 ENCOUNTER — Encounter: Payer: Self-pay | Admitting: Internal Medicine

## 2023-06-06 DIAGNOSIS — E039 Hypothyroidism, unspecified: Secondary | ICD-10-CM | POA: Diagnosis not present

## 2023-06-06 DIAGNOSIS — Z1389 Encounter for screening for other disorder: Secondary | ICD-10-CM | POA: Diagnosis not present

## 2023-06-06 DIAGNOSIS — M109 Gout, unspecified: Secondary | ICD-10-CM | POA: Diagnosis not present

## 2023-06-06 DIAGNOSIS — E785 Hyperlipidemia, unspecified: Secondary | ICD-10-CM | POA: Diagnosis not present

## 2023-06-06 DIAGNOSIS — Z1212 Encounter for screening for malignant neoplasm of rectum: Secondary | ICD-10-CM | POA: Diagnosis not present

## 2023-06-06 DIAGNOSIS — R7301 Impaired fasting glucose: Secondary | ICD-10-CM | POA: Diagnosis not present

## 2023-06-13 ENCOUNTER — Other Ambulatory Visit (HOSPITAL_COMMUNITY): Payer: Self-pay

## 2023-06-13 DIAGNOSIS — D6859 Other primary thrombophilia: Secondary | ICD-10-CM | POA: Diagnosis not present

## 2023-06-13 DIAGNOSIS — Z23 Encounter for immunization: Secondary | ICD-10-CM | POA: Diagnosis not present

## 2023-06-13 DIAGNOSIS — L649 Androgenic alopecia, unspecified: Secondary | ICD-10-CM | POA: Diagnosis not present

## 2023-06-13 DIAGNOSIS — Z1339 Encounter for screening examination for other mental health and behavioral disorders: Secondary | ICD-10-CM | POA: Diagnosis not present

## 2023-06-13 DIAGNOSIS — E119 Type 2 diabetes mellitus without complications: Secondary | ICD-10-CM | POA: Diagnosis not present

## 2023-06-13 DIAGNOSIS — M1990 Unspecified osteoarthritis, unspecified site: Secondary | ICD-10-CM | POA: Diagnosis not present

## 2023-06-13 DIAGNOSIS — Z1331 Encounter for screening for depression: Secondary | ICD-10-CM | POA: Diagnosis not present

## 2023-06-13 DIAGNOSIS — E7800 Pure hypercholesterolemia, unspecified: Secondary | ICD-10-CM | POA: Diagnosis not present

## 2023-06-13 DIAGNOSIS — L719 Rosacea, unspecified: Secondary | ICD-10-CM | POA: Diagnosis not present

## 2023-06-13 DIAGNOSIS — E6609 Other obesity due to excess calories: Secondary | ICD-10-CM | POA: Diagnosis not present

## 2023-06-13 DIAGNOSIS — Z0000 Encounter for general adult medical examination without abnormal findings: Secondary | ICD-10-CM | POA: Diagnosis not present

## 2023-06-13 DIAGNOSIS — E039 Hypothyroidism, unspecified: Secondary | ICD-10-CM | POA: Diagnosis not present

## 2023-06-13 DIAGNOSIS — R82998 Other abnormal findings in urine: Secondary | ICD-10-CM | POA: Diagnosis not present

## 2023-06-13 DIAGNOSIS — N401 Enlarged prostate with lower urinary tract symptoms: Secondary | ICD-10-CM | POA: Diagnosis not present

## 2023-06-13 MED ORDER — CELECOXIB 200 MG PO CAPS
200.0000 mg | ORAL_CAPSULE | Freq: Every day | ORAL | 3 refills | Status: DC | PRN
  Filled 2023-06-13: qty 90, 90d supply, fill #0
  Filled 2024-01-04: qty 90, 90d supply, fill #1
  Filled 2024-04-03: qty 90, 90d supply, fill #2

## 2023-06-13 MED ORDER — LEVOTHYROXINE SODIUM 100 MCG PO TABS
100.0000 ug | ORAL_TABLET | Freq: Every morning | ORAL | 3 refills | Status: DC
  Filled 2023-06-13: qty 90, 90d supply, fill #0
  Filled 2023-09-08: qty 90, 90d supply, fill #1
  Filled 2024-01-04: qty 90, 90d supply, fill #2
  Filled 2024-04-03: qty 90, 90d supply, fill #3

## 2023-06-13 MED ORDER — METHOCARBAMOL 500 MG PO TABS
500.0000 mg | ORAL_TABLET | Freq: Two times a day (BID) | ORAL | 5 refills | Status: DC | PRN
  Filled 2023-06-13: qty 60, 30d supply, fill #0
  Filled 2023-09-08: qty 60, 30d supply, fill #1
  Filled 2023-11-18: qty 60, 30d supply, fill #2
  Filled 2024-01-04: qty 60, 30d supply, fill #3
  Filled 2024-04-03: qty 60, 30d supply, fill #4
  Filled 2024-06-04: qty 60, 30d supply, fill #5

## 2023-06-13 MED ORDER — OZEMPIC (0.25 OR 0.5 MG/DOSE) 2 MG/3ML ~~LOC~~ SOPN
0.2500 mg | PEN_INJECTOR | SUBCUTANEOUS | 5 refills | Status: DC
  Filled 2023-06-13: qty 3, 28d supply, fill #0
  Filled 2023-07-12: qty 3, 28d supply, fill #1
  Filled 2023-08-13: qty 3, 28d supply, fill #2
  Filled 2023-09-08: qty 3, 28d supply, fill #3
  Filled 2023-10-06: qty 3, 28d supply, fill #4
  Filled 2023-11-04: qty 3, 28d supply, fill #5

## 2023-06-19 DIAGNOSIS — H2513 Age-related nuclear cataract, bilateral: Secondary | ICD-10-CM | POA: Diagnosis not present

## 2023-09-21 IMAGING — CT CT SHOULDER*R* W/O CM
1 of 2 series · 9 of 14 positions shown, 12 images · non-contrast
Comparison: X-ray 11/30/2021

CLINICAL DATA: Chronic right shoulder pain.  Surgical planning

EXAM:
CT OF THE UPPER RIGHT EXTREMITY WITHOUT CONTRAST
TECHNIQUE: Multidetector CT imaging of the upper right extremity was performed
according to the standard protocol.
RADIATION DOSE REDUCTION: This exam was performed according to the
departmental dose-optimization program which includes automated
exposure control, adjustment of the mA and/or kV according to
patient size and/or use of iterative reconstruction technique.

[Series 5: thin soft · axial · 0.57mm/px · z∈[+444,+626]mm · 9 of 379 slices shown, 12 images]
[im 38/379  soft-tissue]
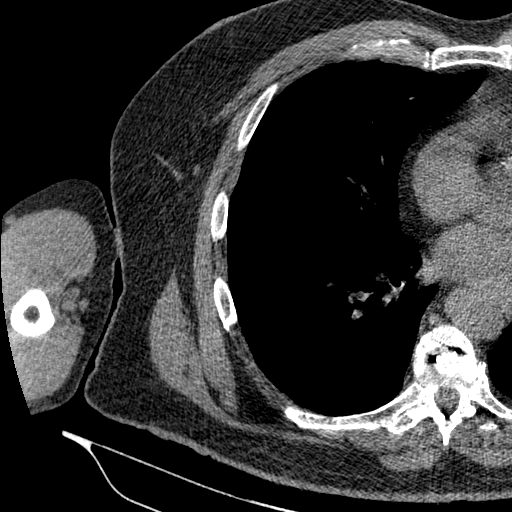
[im 38/379  bone]
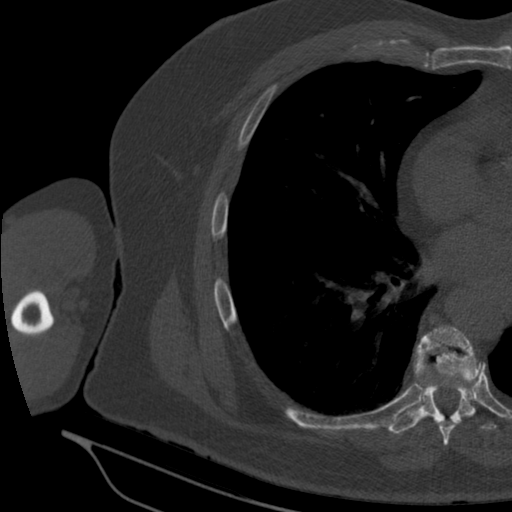
[im 76/379  bone]
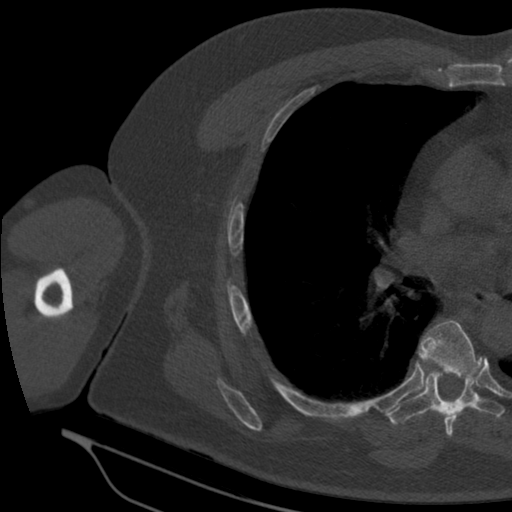
[im 114/379  bone]
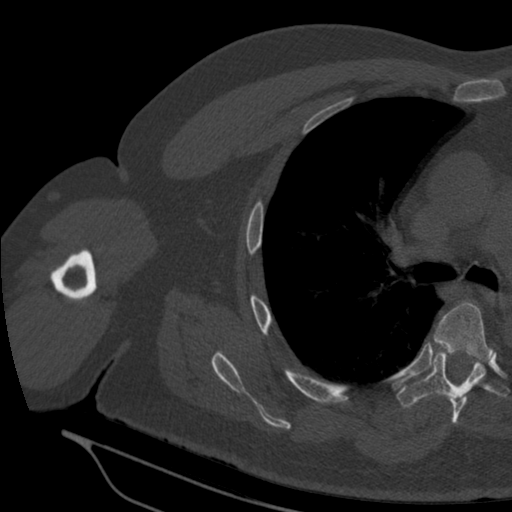
[im 152/379  bone]
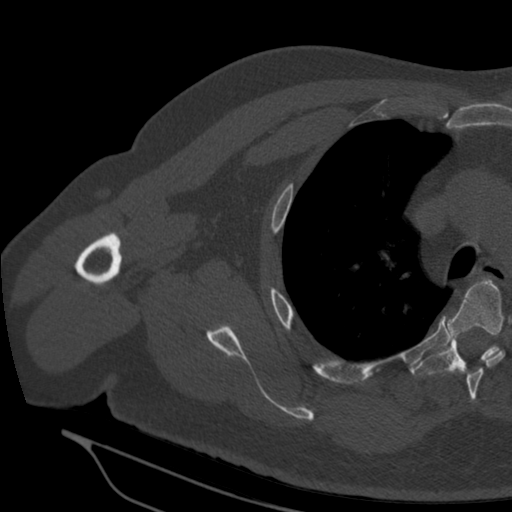
[im 190/379  soft-tissue]
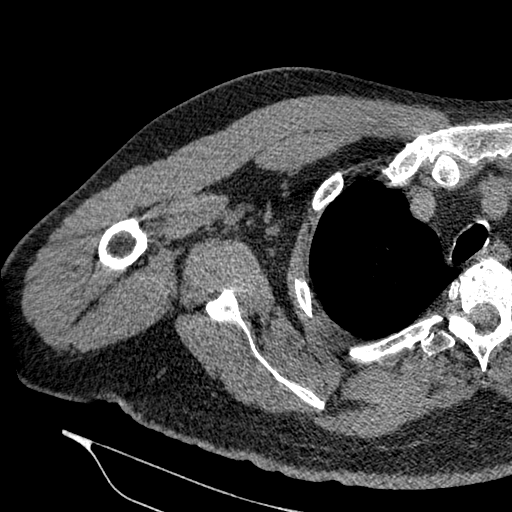
[im 190/379  bone]
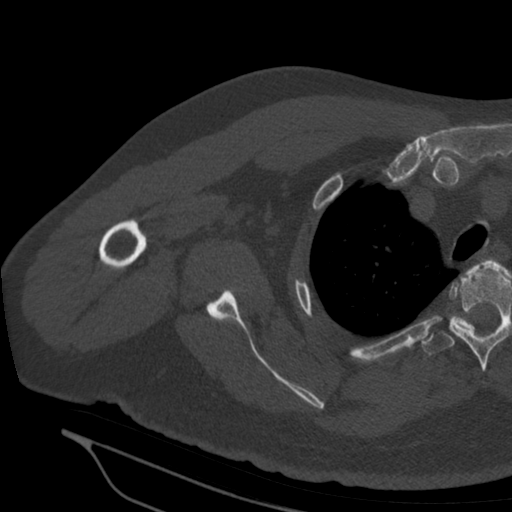
[im 227/379  bone]
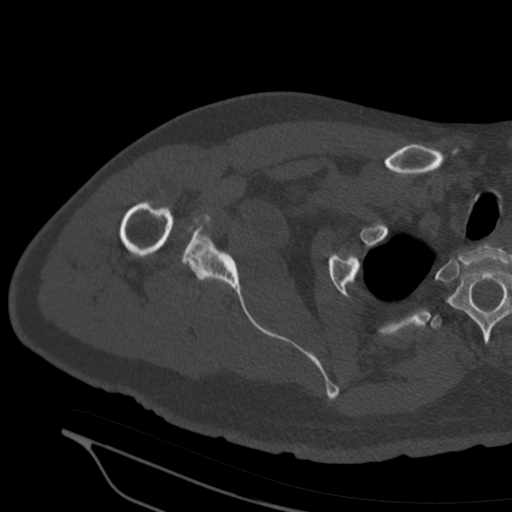
[im 265/379  bone]
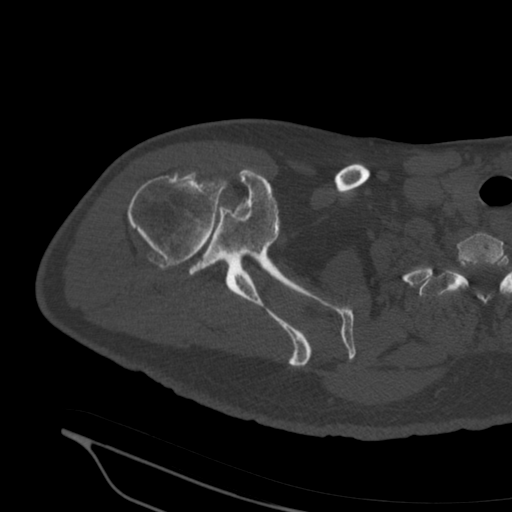
[im 303/379  bone]
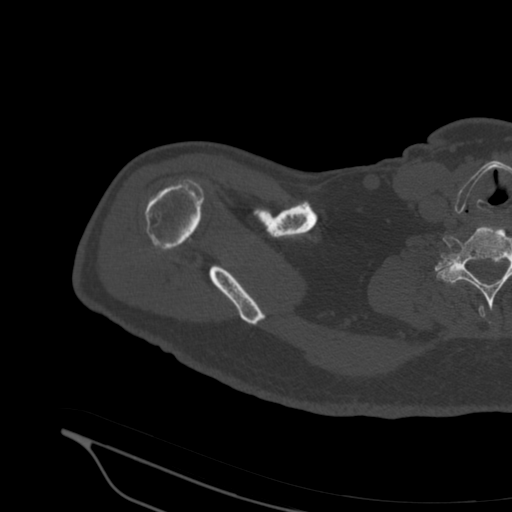
[im 341/379  soft-tissue]
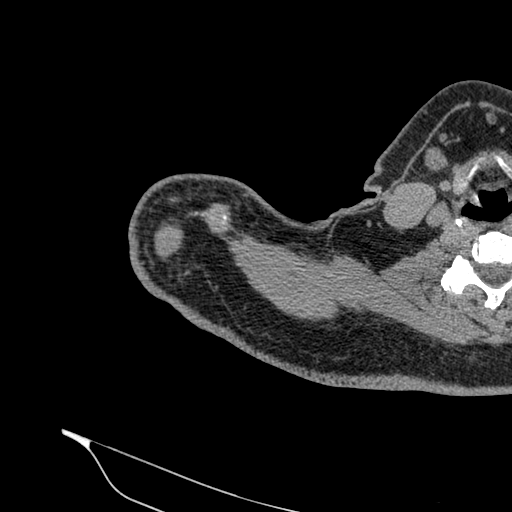
[im 341/379  bone]
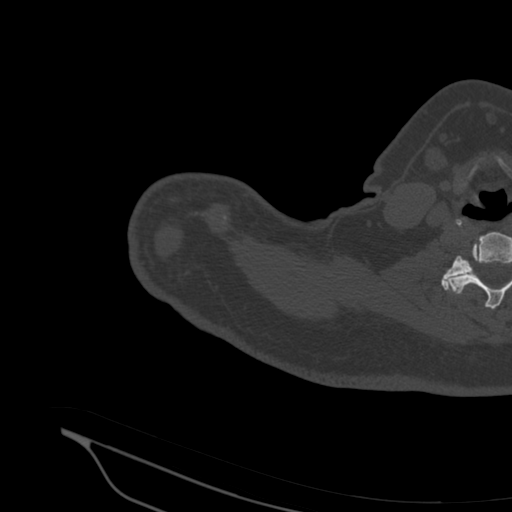

[9 of 14 positions shown; findings below may reference images not displayed]

FINDINGS: Bones/Joint/Cartilage

No acute fracture. No dislocation. Severe glenohumeral joint
osteoarthritis manifested by joint space narrowing, subchondral
sclerosis/cystic change, and bulky marginal osteophyte formation.
Mild superior glenoid retroversion. Moderate-sized glenohumeral
joint effusion containing multiple loose bodies. Fluid distension of
the subscapularis joint recess.

Mild-moderate degenerative changes of the acromioclavicular joint.
No large subacromial-subdeltoid bursal fluid collection. Remaining
visualized osseous structures appear grossly intact.

Ligaments

Suboptimally assessed by CT.

Muscles and Tendons

Large volume of fluid within the long head biceps tendon sheath.
Preserved muscle bulk of the rotator cuff musculature without
significant atrophy or fatty infiltration. Rotator cuff tendons
appear grossly intact within the limitations of CT.

Soft tissues

No extra-articular fluid collection or hematoma. No axillary
lymphadenopathy. Visualized lung field is clear.
IMPRESSION: 1. Severe glenohumeral joint osteoarthritis.
2. Moderate-sized glenohumeral joint effusion containing multiple
loose bodies.
3. Large volume of fluid within the long head biceps tendon sheath,
suggestive of tenosynovitis.
4. Mild-moderate degenerative changes of the acromioclavicular
joint.

## 2023-10-04 ENCOUNTER — Other Ambulatory Visit (HOSPITAL_COMMUNITY): Payer: Self-pay

## 2023-10-04 ENCOUNTER — Other Ambulatory Visit: Payer: Self-pay

## 2023-10-04 MED ORDER — ELIQUIS 5 MG PO TABS
5.0000 mg | ORAL_TABLET | Freq: Two times a day (BID) | ORAL | 4 refills | Status: AC
  Filled 2023-10-04: qty 180, 90d supply, fill #0
  Filled 2024-01-04: qty 180, 90d supply, fill #1
  Filled 2024-04-03: qty 180, 90d supply, fill #2
  Filled 2024-08-01: qty 180, 90d supply, fill #3
  Filled 2024-08-01: qty 180, 90d supply, fill #0

## 2023-10-04 MED ORDER — ALLOPURINOL 300 MG PO TABS
300.0000 mg | ORAL_TABLET | Freq: Every day | ORAL | 4 refills | Status: AC
  Filled 2023-10-04: qty 90, 90d supply, fill #0
  Filled 2024-01-04: qty 90, 90d supply, fill #1
  Filled 2024-04-03: qty 90, 90d supply, fill #2
  Filled 2024-08-01: qty 90, 90d supply, fill #3

## 2023-10-12 ENCOUNTER — Other Ambulatory Visit (HOSPITAL_COMMUNITY): Payer: Self-pay

## 2023-10-12 MED ORDER — COLCHICINE 0.6 MG PO TABS
0.6000 mg | ORAL_TABLET | Freq: Every day | ORAL | 3 refills | Status: AC | PRN
  Filled 2023-10-12: qty 90, 90d supply, fill #0
  Filled 2024-04-28: qty 90, 90d supply, fill #1
  Filled 2024-08-01: qty 90, 90d supply, fill #2

## 2023-10-28 ENCOUNTER — Other Ambulatory Visit (HOSPITAL_COMMUNITY): Payer: Self-pay

## 2023-10-29 ENCOUNTER — Other Ambulatory Visit (HOSPITAL_COMMUNITY): Payer: Self-pay

## 2023-10-29 MED ORDER — DOXYCYCLINE HYCLATE 100 MG PO CAPS
100.0000 mg | ORAL_CAPSULE | Freq: Two times a day (BID) | ORAL | 1 refills | Status: DC
  Filled 2023-10-29: qty 180, 90d supply, fill #0
  Filled 2024-01-22: qty 180, 90d supply, fill #1

## 2023-11-15 ENCOUNTER — Other Ambulatory Visit (HOSPITAL_COMMUNITY): Payer: Self-pay

## 2023-11-15 MED ORDER — FINASTERIDE 5 MG PO TABS
5.0000 mg | ORAL_TABLET | Freq: Every day | ORAL | 3 refills | Status: AC
  Filled 2023-11-15: qty 90, 90d supply, fill #0
  Filled 2024-02-17: qty 90, 90d supply, fill #1
  Filled 2024-06-04: qty 90, 90d supply, fill #2
  Filled 2024-09-13: qty 90, 90d supply, fill #3

## 2023-11-22 ENCOUNTER — Other Ambulatory Visit (HOSPITAL_COMMUNITY): Payer: Self-pay

## 2023-12-04 ENCOUNTER — Other Ambulatory Visit (HOSPITAL_COMMUNITY): Payer: Self-pay

## 2023-12-04 MED ORDER — OZEMPIC (0.25 OR 0.5 MG/DOSE) 2 MG/3ML ~~LOC~~ SOPN
0.2500 mg | PEN_INJECTOR | SUBCUTANEOUS | 5 refills | Status: DC
  Filled 2023-12-04: qty 3, 28d supply, fill #0

## 2023-12-20 ENCOUNTER — Other Ambulatory Visit (HOSPITAL_COMMUNITY): Payer: Self-pay

## 2023-12-20 DIAGNOSIS — M109 Gout, unspecified: Secondary | ICD-10-CM | POA: Diagnosis not present

## 2023-12-20 DIAGNOSIS — D6859 Other primary thrombophilia: Secondary | ICD-10-CM | POA: Diagnosis not present

## 2023-12-20 DIAGNOSIS — M1990 Unspecified osteoarthritis, unspecified site: Secondary | ICD-10-CM | POA: Diagnosis not present

## 2023-12-20 DIAGNOSIS — L719 Rosacea, unspecified: Secondary | ICD-10-CM | POA: Diagnosis not present

## 2023-12-20 DIAGNOSIS — N401 Enlarged prostate with lower urinary tract symptoms: Secondary | ICD-10-CM | POA: Diagnosis not present

## 2023-12-20 DIAGNOSIS — E7800 Pure hypercholesterolemia, unspecified: Secondary | ICD-10-CM | POA: Diagnosis not present

## 2023-12-20 DIAGNOSIS — E039 Hypothyroidism, unspecified: Secondary | ICD-10-CM | POA: Diagnosis not present

## 2023-12-20 DIAGNOSIS — Z86711 Personal history of pulmonary embolism: Secondary | ICD-10-CM | POA: Diagnosis not present

## 2023-12-20 DIAGNOSIS — L649 Androgenic alopecia, unspecified: Secondary | ICD-10-CM | POA: Diagnosis not present

## 2023-12-20 DIAGNOSIS — E119 Type 2 diabetes mellitus without complications: Secondary | ICD-10-CM | POA: Diagnosis not present

## 2023-12-20 MED ORDER — OZEMPIC (2 MG/DOSE) 8 MG/3ML ~~LOC~~ SOPN
2.0000 mg | PEN_INJECTOR | SUBCUTANEOUS | 3 refills | Status: AC
  Filled 2023-12-20: qty 9, 84d supply, fill #0
  Filled 2023-12-27 (×2): qty 3, 28d supply, fill #0
  Filled 2024-01-20: qty 3, 28d supply, fill #1
  Filled 2024-02-17: qty 3, 28d supply, fill #2
  Filled 2024-03-12: qty 3, 28d supply, fill #3
  Filled 2024-04-11: qty 3, 28d supply, fill #4
  Filled 2024-05-08: qty 3, 28d supply, fill #5
  Filled 2024-06-04: qty 3, 28d supply, fill #6
  Filled 2024-06-29: qty 3, 28d supply, fill #7
  Filled 2024-08-01: qty 3, 28d supply, fill #8
  Filled 2024-08-24: qty 3, 28d supply, fill #9
  Filled 2024-09-23: qty 6, 56d supply, fill #0

## 2023-12-20 MED ORDER — AZELAIC ACID 15 % EX GEL
1.0000 | Freq: Two times a day (BID) | CUTANEOUS | 3 refills | Status: AC
  Filled 2023-12-20: qty 150, 90d supply, fill #0

## 2023-12-21 ENCOUNTER — Other Ambulatory Visit (HOSPITAL_COMMUNITY): Payer: Self-pay

## 2023-12-27 ENCOUNTER — Other Ambulatory Visit (HOSPITAL_COMMUNITY): Payer: Self-pay

## 2024-01-04 ENCOUNTER — Other Ambulatory Visit (HOSPITAL_COMMUNITY): Payer: Self-pay

## 2024-01-22 ENCOUNTER — Other Ambulatory Visit: Payer: Self-pay

## 2024-01-22 ENCOUNTER — Other Ambulatory Visit (HOSPITAL_COMMUNITY): Payer: Self-pay

## 2024-01-24 ENCOUNTER — Other Ambulatory Visit: Payer: Self-pay

## 2024-02-06 DIAGNOSIS — M7072 Other bursitis of hip, left hip: Secondary | ICD-10-CM | POA: Diagnosis not present

## 2024-03-12 ENCOUNTER — Other Ambulatory Visit (HOSPITAL_COMMUNITY): Payer: Self-pay

## 2024-03-18 ENCOUNTER — Other Ambulatory Visit: Payer: Self-pay

## 2024-04-03 ENCOUNTER — Other Ambulatory Visit (HOSPITAL_COMMUNITY): Payer: Self-pay

## 2024-04-03 ENCOUNTER — Other Ambulatory Visit: Payer: Self-pay

## 2024-04-03 MED ORDER — ROSUVASTATIN CALCIUM 5 MG PO TABS
5.0000 mg | ORAL_TABLET | Freq: Every day | ORAL | 4 refills | Status: DC
  Filled 2024-04-03: qty 90, 90d supply, fill #0

## 2024-04-28 ENCOUNTER — Other Ambulatory Visit: Payer: Self-pay

## 2024-04-28 ENCOUNTER — Other Ambulatory Visit (HOSPITAL_COMMUNITY): Payer: Self-pay

## 2024-04-28 MED ORDER — DOXYCYCLINE HYCLATE 100 MG PO CAPS
100.0000 mg | ORAL_CAPSULE | Freq: Two times a day (BID) | ORAL | 1 refills | Status: AC
  Filled 2024-04-28: qty 180, 90d supply, fill #0
  Filled 2024-08-01: qty 180, 90d supply, fill #1

## 2024-04-29 ENCOUNTER — Other Ambulatory Visit (HOSPITAL_COMMUNITY): Payer: Self-pay

## 2024-06-10 DIAGNOSIS — E039 Hypothyroidism, unspecified: Secondary | ICD-10-CM | POA: Diagnosis not present

## 2024-06-10 DIAGNOSIS — R7301 Impaired fasting glucose: Secondary | ICD-10-CM | POA: Diagnosis not present

## 2024-06-10 DIAGNOSIS — E7800 Pure hypercholesterolemia, unspecified: Secondary | ICD-10-CM | POA: Diagnosis not present

## 2024-06-10 DIAGNOSIS — M109 Gout, unspecified: Secondary | ICD-10-CM | POA: Diagnosis not present

## 2024-06-10 DIAGNOSIS — Z0189 Encounter for other specified special examinations: Secondary | ICD-10-CM | POA: Diagnosis not present

## 2024-06-18 ENCOUNTER — Other Ambulatory Visit (HOSPITAL_COMMUNITY): Payer: Self-pay

## 2024-06-18 DIAGNOSIS — M109 Gout, unspecified: Secondary | ICD-10-CM | POA: Diagnosis not present

## 2024-06-18 DIAGNOSIS — R82998 Other abnormal findings in urine: Secondary | ICD-10-CM | POA: Diagnosis not present

## 2024-06-18 DIAGNOSIS — Z0000 Encounter for general adult medical examination without abnormal findings: Secondary | ICD-10-CM | POA: Diagnosis not present

## 2024-06-18 DIAGNOSIS — K760 Fatty (change of) liver, not elsewhere classified: Secondary | ICD-10-CM | POA: Diagnosis not present

## 2024-06-18 DIAGNOSIS — E6609 Other obesity due to excess calories: Secondary | ICD-10-CM | POA: Diagnosis not present

## 2024-06-18 DIAGNOSIS — M1990 Unspecified osteoarthritis, unspecified site: Secondary | ICD-10-CM | POA: Diagnosis not present

## 2024-06-18 DIAGNOSIS — E039 Hypothyroidism, unspecified: Secondary | ICD-10-CM | POA: Diagnosis not present

## 2024-06-18 DIAGNOSIS — Z1331 Encounter for screening for depression: Secondary | ICD-10-CM | POA: Diagnosis not present

## 2024-06-18 DIAGNOSIS — Z1339 Encounter for screening examination for other mental health and behavioral disorders: Secondary | ICD-10-CM | POA: Diagnosis not present

## 2024-06-18 DIAGNOSIS — N401 Enlarged prostate with lower urinary tract symptoms: Secondary | ICD-10-CM | POA: Diagnosis not present

## 2024-06-18 DIAGNOSIS — L649 Androgenic alopecia, unspecified: Secondary | ICD-10-CM | POA: Diagnosis not present

## 2024-06-18 DIAGNOSIS — Z23 Encounter for immunization: Secondary | ICD-10-CM | POA: Diagnosis not present

## 2024-06-18 DIAGNOSIS — E7800 Pure hypercholesterolemia, unspecified: Secondary | ICD-10-CM | POA: Diagnosis not present

## 2024-06-18 DIAGNOSIS — D6859 Other primary thrombophilia: Secondary | ICD-10-CM | POA: Diagnosis not present

## 2024-06-18 DIAGNOSIS — Z86711 Personal history of pulmonary embolism: Secondary | ICD-10-CM | POA: Diagnosis not present

## 2024-06-18 DIAGNOSIS — E119 Type 2 diabetes mellitus without complications: Secondary | ICD-10-CM | POA: Diagnosis not present

## 2024-06-18 MED ORDER — METHOCARBAMOL 500 MG PO TABS
500.0000 mg | ORAL_TABLET | Freq: Two times a day (BID) | ORAL | 5 refills | Status: AC | PRN
  Filled 2024-06-18 – 2024-09-13 (×2): qty 60, 30d supply, fill #0

## 2024-06-18 MED ORDER — GABAPENTIN 300 MG PO CAPS
300.0000 mg | ORAL_CAPSULE | Freq: Two times a day (BID) | ORAL | 3 refills | Status: AC
  Filled 2024-06-18: qty 180, 90d supply, fill #0

## 2024-06-18 MED ORDER — CELECOXIB 200 MG PO CAPS
200.0000 mg | ORAL_CAPSULE | Freq: Every day | ORAL | 3 refills | Status: AC | PRN
  Filled 2024-06-18: qty 90, 90d supply, fill #0

## 2024-06-18 MED ORDER — LEVOTHYROXINE SODIUM 100 MCG PO TABS
100.0000 ug | ORAL_TABLET | Freq: Every morning | ORAL | 3 refills | Status: AC
  Filled 2024-06-18 – 2024-09-29 (×3): qty 90, 90d supply, fill #0

## 2024-06-18 MED ORDER — AZELAIC ACID 15 % EX GEL
1.0000 "application " | Freq: Two times a day (BID) | CUTANEOUS | 3 refills | Status: AC
  Filled 2024-06-18: qty 150, 90d supply, fill #0

## 2024-06-19 ENCOUNTER — Other Ambulatory Visit: Payer: Self-pay | Admitting: Internal Medicine

## 2024-06-19 ENCOUNTER — Other Ambulatory Visit (HOSPITAL_COMMUNITY): Payer: Self-pay

## 2024-06-19 DIAGNOSIS — Z0000 Encounter for general adult medical examination without abnormal findings: Secondary | ICD-10-CM

## 2024-06-20 ENCOUNTER — Other Ambulatory Visit (HOSPITAL_COMMUNITY): Payer: Self-pay

## 2024-06-25 ENCOUNTER — Ambulatory Visit
Admission: RE | Admit: 2024-06-25 | Discharge: 2024-06-25 | Disposition: A | Discharge status: Home / Self Care | Source: Ambulatory Visit | Attending: Internal Medicine | Admitting: Internal Medicine

## 2024-06-25 ENCOUNTER — Other Ambulatory Visit (HOSPITAL_COMMUNITY): Payer: Self-pay | Admitting: Internal Medicine

## 2024-06-25 DIAGNOSIS — R0989 Other specified symptoms and signs involving the circulatory and respiratory systems: Secondary | ICD-10-CM

## 2024-06-25 DIAGNOSIS — I2510 Atherosclerotic heart disease of native coronary artery without angina pectoris: Secondary | ICD-10-CM | POA: Diagnosis not present

## 2024-06-25 DIAGNOSIS — Z0000 Encounter for general adult medical examination without abnormal findings: Secondary | ICD-10-CM

## 2024-06-26 NOTE — Progress Notes (Signed)
 Ellouise Console, PA-C [address] [place], [place]  [address] Phone: [phone]   Primary Care Physician: Tisovec, Charlie ORN, MD  Primary Gastroenterologist:  Ellouise Console, PA-C / Norleen Kiang, MD   Chief Complaint: History of colon polyps.  Discussed repeat colonoscopy.       HPI:   Jason Norton is a 68 y.o. male, established patient Dr. Kiang, is referred by his PCP Dr. Tisovec to schedule repeat colonoscopy.  Patient had recent complete physical.  Screening Hemoccult test was positive.  Patient rarely sees mild bright red blood in his stool attributed to hemorrhoids.  Labs for the past few years have showed normal hemoglobin 16 g.  No anemia.  Patient denies abdominal pain, constipation, diarrhea, or significant rectal bleeding.  He has had mild intentional 20 pound weight loss in the past 6 months attributed to taking Ozempic .  He is not hungry since being on Ozempic .  He was desiring weight loss.  His hemoglobin A1c has improved to 5.5.  He has a history of a pulmonary embolism, which occurred over twenty years ago after extensive flight travel. He was previously on Coumadin  for ten years and now takes Eliquis .  He is adopted and is unsure of his family medical history.  06/2018 last colonoscopy by Dr. Kiang: 1 small 5 mm sessile serrated polyp removed.  Sigmoid diverticulosis.  Internal hemorrhoids.  Otherwise normal.  5-year repeat (due 06/2023).  PMH: History of pulmonary embolism currently on Eliquis , gout, arthritis, hypothyroidism, type 2 diabetes, hyperlipidemia, fatty liver, BPH.  Eliquis  is prescribed by his PCP Dr. Tisovec.  Patient denies shortness of breath or chest pain.   Current Outpatient Medications  Medication Sig Dispense Refill   allopurinol  (ZYLOPRIM ) 300 MG tablet Take 1 tablet (300 mg total) by mouth daily. 90 tablet 4   apixaban  (ELIQUIS ) 5 MG TABS tablet Take 1 tablet (5 mg total) by mouth 2 (two) times daily. 180 tablet 4   Ascorbic Acid   (VITAMIN C ) 1000 MG tablet Take 1,000 mg by mouth in the morning.     Azelaic Acid  (FINACEA ) 15 % gel Apply 1 Application topically 2 (two) times daily. 150 g 3   Azelaic Acid  (FINACEA ) 15 % gel Apply 1 application  topically 2 (two) times daily. 150 g 3   celecoxib  (CELEBREX ) 200 MG capsule Take 1 capsule (200 mg total) by mouth daily as needed  FOR PAIN 90 capsule 3   cholecalciferol (VITAMIN D3) 25 MCG (1000 UNIT) tablet Take 1,000 Units by mouth in the morning.     colchicine  0.6 MG tablet Take 0.6 mg by mouth daily as needed (gout).      colchicine  0.6 MG tablet Take 1 tablet (0.6 mg total) by mouth daily as needed. 90 tablet 3   doxycycline  (VIBRAMYCIN ) 100 MG capsule Take 1 capsule (100 mg total) by mouth 2 (two) times daily. 180 capsule 1   finasteride  (PROSCAR ) 5 MG tablet Take 1 tablet (5 mg total) by mouth daily. 90 tablet 3   gabapentin  (NEURONTIN ) 300 MG capsule Take 1 capsule (300 mg total) by mouth 2 (two) times daily. 180 capsule 3   levothyroxine  (SYNTHROID ) 100 MCG tablet Take 1 tablet (100 mcg total) by mouth in the morning on an empty stomach, separate by 30 minutes from food or calcium  products. 90 tablet 3   methocarbamol  (ROBAXIN ) 500 MG tablet Take 1 tablet (500 mg total) by mouth 2 (two) times daily as needed for muscle  spasm 60 tablet 5   Na Sulfate-K Sulfate-Mg Sulfate concentrate (SUPREP) 17.5-3.13-1.6 GM/177ML SOLN Take 1 kit (354 mLs total) by mouth once for 1 dose. 354 mL 0   oxyCODONE  (OXY IR/ROXICODONE ) 5 MG immediate release tablet Take 1 tablet (5 mg total) by mouth every 8 (eight) hours as needed for moderate pain (pain score 4-6). 30 tablet 0   rosuvastatin  (CRESTOR ) 5 MG tablet Take 1 tablet (5 mg total) by mouth daily. 90 tablet 4   Semaglutide , 2 MG/DOSE, (OZEMPIC , 2 MG/DOSE,) 8 MG/3ML SOPN Inject 2 mg into the skin once a week. 9 mL 3   vitamin B-12 (CYANOCOBALAMIN ) 1000 MCG tablet Take 1,000 mcg by mouth in the morning.     vitamin E 180 MG (400 UNITS)  capsule Take 400 Units by mouth in the morning.     allopurinol  (ZYLOPRIM ) 300 MG tablet Take 1 tablet (300 mg total) by mouth daily. (Patient taking differently: Take 300 mg by mouth at bedtime.) 90 tablet 4   doxycycline  (VIBRAMYCIN ) 100 MG capsule Take 1 capsule (100 mg total) by mouth 2 (two) times daily. 180 capsule 1   levothyroxine  (SYNTHROID ) 100 MCG tablet Take 1 tablet (100 mcg total) by mouth in the morning on an empty stomach, separate by 30 minutes from food or calcium  products. 30 tablet 12   methocarbamol  (ROBAXIN ) 500 MG tablet Take 1 tablet (500 mg total) by mouth 2 (two) times daily as needed for muscle spasms (Patient taking differently: Take 500-1,000 mg by mouth every 4 (four) hours as needed (back spasms.).) 60 tablet 3   Semaglutide ,0.25 or 0.5MG /DOS, (OZEMPIC , 0.25 OR 0.5 MG/DOSE,) 2 MG/3ML SOPN Inject 0.25 mg into the skin once a week. 3 mL 5   Semaglutide -Weight Management (WEGOVY ) 2.4 MG/0.75ML SOAJ Inject 2.4 mg into the skin once a week. 3 mL 5   SYNTHROID  100 MCG tablet Take 1 tablet (100 mcg total) by mouth in the morning empty stomach, separate by 30 minutes from food or calcium  products. 30 tablet 12   No current facility-administered medications for this visit.    Allergies as of 06/27/2024 - Review Complete 06/27/2024  Allergen Reaction Noted   Sulfa antibiotics Other (See Comments) 11/10/2019    Past Medical History:  Diagnosis Date   Carpal tunnel syndrome    Clotting disorder    Diverticulosis    DJD (degenerative joint disease)    Gout    Hyperlipemia    Hypothyroidism    Meningitis    at age 20 weeks old   PE (pulmonary embolism) 09/12/2003   hx post freq travel-2005   Umbilical hernia     Past Surgical History:  Procedure Laterality Date   BACK SURGERY     lumb lam   CARPAL TUNNEL RELEASE  08/27/2012   Procedure: CARPAL TUNNEL RELEASE;  Surgeon: Elsie Mussel, MD;  Location: [facility];  Service: Orthopedics;  Laterality:  Right;  Right Limited Open Carpal Tunnel Release    COLONOSCOPY     EXCISION ORAL LESION WITH CO2 LASER Right 06/26/2022   Procedure: ORAL BIOPSY WITH CO2 LASER ABLATION OF LESION;  Surgeon: Jesus Oliphant, MD;  Location: [facility];  Service: ENT;  Laterality: Right;   I & D EXTREMITY  09/07/2012   Procedure: IRRIGATION AND DEBRIDEMENT EXTREMITY;  Surgeon: Prentice LELON Pagan, MD;  Location: [facility] OR;  Service: Orthopedics;  Laterality: Right;   INSERTION OF MESH N/A 05/12/2015   Procedure: INSERTION OF MESH;  Surgeon: Lynda Leos,  MD;  Location: [facility] OR;  Service: General;  Laterality: N/A;   KNEE ARTHROPLASTY Right 09/11/1973   right knee reconstruction hign [id number]   KNEE ARTHROSCOPY Right    x3   REVERSE SHOULDER ARTHROPLASTY Right 01/24/2022   Procedure: RIGHT REVERSE SHOULDER ARTHROPLASTY;  Surgeon: Addie Cordella Hamilton, MD;  Location: [facility];  Service: Orthopedics;  Laterality: Right;   STERIOD INJECTION  08/27/2012   Procedure: STEROID INJECTION;  Surgeon: Elsie Mussel, MD;  Location: [facility];  Service: Orthopedics;  Laterality: Left;   TOTAL HIP ARTHROPLASTY  09/11/2010   left   TOTAL KNEE ARTHROPLASTY  09/11/2005   rt   TOTAL KNEE REVISION  09/11/2009   rt   UMBILICAL HERNIA REPAIR N/A 05/12/2015   Procedure: LAPAROSCOPIC UMBILICAL HERNIA REPAIR;  Surgeon: Lynda Leos, MD;  Location: [facility] OR;  Service: General;  Laterality: N/A;   WRIST ARTHROSCOPY  09/11/2004   debrid-rt    Review of Systems:    All systems reviewed and negative except where noted in HPI.    Physical Exam:  BP 126/76   Pulse 96   Ht 5' 9 (1.753 m)   Wt 193 lb (87.5 kg)   BMI 28.50 kg/m  No LMP for male patient.  General: Well-nourished, well-developed in no acute distress.  Lungs: Clear to auscultation bilaterally. Non-labored. Heart: Regular rate and rhythm, no murmurs rubs or gallops.  Abdomen: Bowel sounds are normal; Abdomen is Soft; No hepatosplenomegaly,  masses or hernias;  No Abdominal Tenderness; No guarding or rebound tenderness. Neuro: Alert and oriented x 3.  Grossly intact.  Psych: Alert and cooperative, normal mood and affect.    Imaging Studies: VAS US  CAROTID Result Date: 06/27/2024 Carotid Arterial Duplex Study Patient Name:  ANTOWAN SAMFORD Norton  Date of Exam:   06/27/2024 Medical Rec #: [id number]          Accession #:    [id number] Date of Birth: April 11, 1956          Patient Gender: M Patient Age:   68 years Exam Location:  [address] Procedure:      VAS US  CAROTID Referring Phys: CHARLIE TISOVEC --------------------------------------------------------------------------------  Indications: Bilateral bruits. Performing Technologist: [place] RVT  Examination Guidelines: A complete evaluation includes B-mode imaging, spectral Doppler, color Doppler, and power Doppler as needed of all accessible portions of each vessel. Bilateral testing is considered an integral part of a complete examination. Limited examinations for reoccurring indications may be performed as noted.  Right Carotid Findings: +----------+--------+--------+--------+------------------+--------+           PSV cm/sEDV cm/sStenosisPlaque DescriptionComments +----------+--------+--------+--------+------------------+--------+ CCA Prox  56      17                                         +----------+--------+--------+--------+------------------+--------+ CCA Mid   72      23                                         +----------+--------+--------+--------+------------------+--------+ CCA Distal82      28                                         +----------+--------+--------+--------+------------------+--------+ ICA Prox  56      20      Normal  heterogenous               +----------+--------+--------+--------+------------------+--------+ ICA Mid   40      20                                          +----------+--------+--------+--------+------------------+--------+ ICA Distal47      21                                         +----------+--------+--------+--------+------------------+--------+ ECA       70      19                                         +----------+--------+--------+--------+------------------+--------+ +----------+--------+-------+--------+-------------------+           PSV cm/sEDV cmsDescribeArm Pressure (mmHG) +----------+--------+-------+--------+-------------------+ Subclavian99      10                                 +----------+--------+-------+--------+-------------------+ +---------+--------+--+--------+--+ VertebralPSV cm/s47EDV cm/s13 +---------+--------+--+--------+--+  Left Carotid Findings: +----------+--------+--------+--------+------------------+--------+           PSV cm/sEDV cm/sStenosisPlaque DescriptionComments +----------+--------+--------+--------+------------------+--------+ CCA Prox  114     30                                         +----------+--------+--------+--------+------------------+--------+ CCA Mid   82      24                                         +----------+--------+--------+--------+------------------+--------+ CCA Distal80      26                                         +----------+--------+--------+--------+------------------+--------+ ICA Prox  56      15      Normal                             +----------+--------+--------+--------+------------------+--------+ ICA Mid   46      21                                         +----------+--------+--------+--------+------------------+--------+ ICA Distal31      14                                         +----------+--------+--------+--------+------------------+--------+ ECA       61      16              heterogenous               +----------+--------+--------+--------+------------------+--------+  +----------+--------+--------+--------+-------------------+  PSV cm/sEDV cm/sDescribeArm Pressure (mmHG) +----------+--------+--------+--------+-------------------+ Subclavian125     0                                   +----------+--------+--------+--------+-------------------+ +---------+--------+--+--------+--+ VertebralPSV cm/s52EDV cm/s14 +---------+--------+--+--------+--+   Summary: Right Carotid: There is no evidence of stenosis in the right ICA. Left Carotid: There is no evidence of stenosis in the left ICA. Vertebrals:  Bilateral vertebral arteries demonstrate antegrade flow. Subclavians: Normal flow hemodynamics were seen in bilateral subclavian              arteries. *See table(s) above for measurements and observations.  Electronically signed by Lonni Gaskins MD on 06/27/2024 at 3:13:17 PM.    Final    CT CARDIAC SCORING ([facility] LOCATIONS ONLY) Result Date: 06/26/2024 CLINICAL DATA:  68 year old white male. Encounter for general adult medical examination without abnormal findings. * Tracking Code: FCC * EXAM: CT CARDIAC CORONARY ARTERY CALCIUM  SCORE TECHNIQUE: Non-contrast imaging through the heart was performed using prospective ECG gating. Image post processing was performed on an independent workstation, allowing for quantitative analysis of the heart and coronary arteries. Note that this exam targets the heart and the chest was not imaged in its entirety. COMPARISON:  None Available. FINDINGS: CORONARY CALCIUM  SCORES: Left Main: 3.61 LAD: 254 LCx: 186 RCA: 129 Total Agatston Score: 572 MESA database percentile: 81 AORTA MEASUREMENTS: Ascending Aorta: 3.9 cm Descending Aorta: 2.4 cm OTHER FINDINGS: Aortic atherosclerotic calcifications. Heart size is normal. No significant pericardial effusion. Visualized mediastinal structures are normal. Images of the upper abdomen are unremarkable. No airspace disease or consolidation in the visualized lungs. No acute bone  abnormality. IMPRESSION: Coronary calcium  score is 572 and this is at percentile 81 for subjects of the same age, gender and ethnicity. Aortic Atherosclerosis (ICD10-I70.0). Electronically Signed   By: Juliene Balder M.D.   On: 06/26/2024 08:53    Labs: CBC    Component Value Date/Time   WBC 15.4 (H) 01/25/2022 0659   RBC 4.83 01/25/2022 0659   HGB 14.1 01/25/2022 0659   HCT 43.2 01/25/2022 0659   PLT 191 01/25/2022 0659   MCV 89.4 01/25/2022 0659   MCH 29.2 01/25/2022 0659   MCHC 32.6 01/25/2022 0659   RDW 15.0 01/25/2022 0659   LYMPHSABS 4.2 (H) 09/07/2012 1216   MONOABS 0.5 09/07/2012 1216   EOSABS 0.1 09/07/2012 1216   BASOSABS 0.0 09/07/2012 1216    CMP     Component Value Date/Time   NA 139 01/18/2022 1426   NA 142 01/19/2020 1439   K 4.3 01/18/2022 1426   CL 107 01/18/2022 1426   CO2 23 01/18/2022 1426   GLUCOSE 93 01/18/2022 1426   BUN 13 01/18/2022 1426   BUN 18 01/19/2020 1439   CREATININE 1.05 01/18/2022 1426   CALCIUM  9.8 01/18/2022 1426   PROT 7.4 03/17/2011 1120   ALBUMIN 4.6 03/17/2011 1120   AST 15 03/17/2011 1120   ALT 21 03/17/2011 1120   ALKPHOS 115 03/17/2011 1120   BILITOT 0.6 03/17/2011 1120   GFRNONAA >60 01/18/2022 1426   GFRAA 93 01/19/2020 1439       Assessment and Plan:   BRODERICK FONSECA Norton is a 68 y.o. y/o male presents for:  1.  History of sessile serrated polyp  - Scheduling repeat surveillance colonoscopy I discussed risks of colonoscopy with patient to include risk of bleeding, colon perforation, and risk of sedation.  Patient expressed understanding  and agrees to proceed with colonoscopy.   2.  Comorbidities: History of pulmonary embolism currently on Eliquis , gout, arthritis, hypothyroidism, type 2 diabetes, hyperlipidemia, fatty liver, BPH - Requesting permission from Dr. Tisovec to hold Eliquis  2 days before colonoscopy.  3.  Recent positive screening Hemoccult test.  Most likely due to hemorrhoids.  He has no anemia; Hgb 16  g. - Scheduling updated colonoscopy.   Ellouise Console, PA-C  Follow up as needed based on Colonoscopy results.

## 2024-06-27 ENCOUNTER — Encounter: Payer: Self-pay | Admitting: Physician Assistant

## 2024-06-27 ENCOUNTER — Ambulatory Visit (INDEPENDENT_AMBULATORY_CARE_PROVIDER_SITE_OTHER): Admitting: Physician Assistant

## 2024-06-27 ENCOUNTER — Other Ambulatory Visit (HOSPITAL_COMMUNITY): Payer: Self-pay

## 2024-06-27 ENCOUNTER — Ambulatory Visit (HOSPITAL_COMMUNITY)
Admission: RE | Admit: 2024-06-27 | Discharge: 2024-06-27 | Disposition: A | Discharge status: Home / Self Care | Source: Ambulatory Visit | Attending: Vascular Surgery | Admitting: Vascular Surgery

## 2024-06-27 VITALS — BP 126/76 | HR 96 | Ht 69.0 in | Wt 193.0 lb

## 2024-06-27 DIAGNOSIS — Z860101 Personal history of adenomatous and serrated colon polyps: Secondary | ICD-10-CM | POA: Diagnosis not present

## 2024-06-27 DIAGNOSIS — Z7901 Long term (current) use of anticoagulants: Secondary | ICD-10-CM | POA: Diagnosis not present

## 2024-06-27 DIAGNOSIS — Z860100 Personal history of colon polyps, unspecified: Secondary | ICD-10-CM

## 2024-06-27 DIAGNOSIS — R0989 Other specified symptoms and signs involving the circulatory and respiratory systems: Secondary | ICD-10-CM | POA: Diagnosis not present

## 2024-06-27 MED ORDER — NA SULFATE-K SULFATE-MG SULF 17.5-3.13-1.6 GM/177ML PO SOLN
1.0000 | Freq: Once | ORAL | 0 refills | Status: AC
Start: 2024-06-27 — End: 2024-07-02
  Filled 2024-06-27: qty 354, 1d supply, fill #0

## 2024-06-27 NOTE — Progress Notes (Signed)
 Noted

## 2024-06-27 NOTE — Patient Instructions (Signed)
 You have been scheduled for a Colonoscopy. Please follow written instructions given to you at your visit today.   If you use inhalers (even only as needed), please bring them with you on the day of your procedure.  DO NOT TAKE 7 DAYS PRIOR TO TEST- Trulicity (dulaglutide) Ozempic , Wegovy  (semaglutide ) Mounjaro (tirzepatide) Bydureon Bcise (exanatide extended release)  DO NOT TAKE 1 DAY PRIOR TO YOUR TEST Rybelsus  (semaglutide ) Adlyxin (lixisenatide) Victoza (liraglutide) Byetta (exanatide) ___________________________________________________________________________  Please follow up sooner if symptoms increase or worsen   Due to recent changes in healthcare laws, you may see the results of your imaging and laboratory studies on MyChart before your provider has had a chance to review them.  We understand that in some cases there may be results that are confusing or concerning to you. Not all laboratory results come back in the same time frame and the provider may be waiting for multiple results in order to interpret others.  Please give us  48 hours in order for your provider to thoroughly review all the results before contacting the office for clarification of your results.   Thank you for trusting me with your gastrointestinal care!   Ellouise Console, PA-C _______________________________________________________  If your blood pressure at your visit was 140/90 or greater, please contact your primary care physician to follow up on this.  _______________________________________________________  If you are age 46 or older, your body mass index should be between 23-30. Your Body mass index is 28.5 kg/m. If this is out of the aforementioned range listed, please consider follow up with your Primary Care Provider.  If you are age 71 or younger, your body mass index should be between 19-25. Your Body mass index is 28.5 kg/m. If this is out of the aformentioned range listed, please consider  follow up with your Primary Care Provider.   ________________________________________________________  The [facility] providers would like to encourage you to use MYCHART to communicate with providers for non-urgent requests or questions.  Due to long hold times on the telephone, sending your provider a message by [facility] may be a faster and more efficient way to get a response.  Please allow 48 business hours for a response.  Please remember that this is for non-urgent requests.  _______________________________________________________

## 2024-06-28 NOTE — Progress Notes (Signed)
 Noted

## 2024-06-30 ENCOUNTER — Other Ambulatory Visit (HOSPITAL_COMMUNITY): Payer: Self-pay

## 2024-06-30 ENCOUNTER — Other Ambulatory Visit: Payer: Self-pay

## 2024-06-30 MED ORDER — ROSUVASTATIN CALCIUM 10 MG PO TABS
10.0000 mg | ORAL_TABLET | Freq: Every day | ORAL | 3 refills | Status: AC
  Filled 2024-06-30 – 2024-09-24 (×3): qty 90, 90d supply, fill #0

## 2024-07-03 ENCOUNTER — Encounter: Payer: Self-pay | Admitting: Internal Medicine

## 2024-07-10 ENCOUNTER — Ambulatory Visit: Admitting: Internal Medicine

## 2024-07-10 ENCOUNTER — Encounter: Payer: Self-pay | Admitting: Internal Medicine

## 2024-07-10 VITALS — BP 126/85 | HR 66 | Temp 98.4°F | Resp 18 | Ht 69.0 in | Wt 193.0 lb

## 2024-07-10 DIAGNOSIS — K5730 Diverticulosis of large intestine without perforation or abscess without bleeding: Secondary | ICD-10-CM | POA: Diagnosis not present

## 2024-07-10 DIAGNOSIS — Z7901 Long term (current) use of anticoagulants: Secondary | ICD-10-CM | POA: Diagnosis not present

## 2024-07-10 DIAGNOSIS — E119 Type 2 diabetes mellitus without complications: Secondary | ICD-10-CM | POA: Diagnosis not present

## 2024-07-10 DIAGNOSIS — D122 Benign neoplasm of ascending colon: Secondary | ICD-10-CM

## 2024-07-10 DIAGNOSIS — Z1211 Encounter for screening for malignant neoplasm of colon: Secondary | ICD-10-CM | POA: Diagnosis not present

## 2024-07-10 DIAGNOSIS — D123 Benign neoplasm of transverse colon: Secondary | ICD-10-CM | POA: Diagnosis not present

## 2024-07-10 DIAGNOSIS — Z860101 Personal history of adenomatous and serrated colon polyps: Secondary | ICD-10-CM | POA: Diagnosis not present

## 2024-07-10 DIAGNOSIS — K648 Other hemorrhoids: Secondary | ICD-10-CM | POA: Diagnosis not present

## 2024-07-10 DIAGNOSIS — E039 Hypothyroidism, unspecified: Secondary | ICD-10-CM | POA: Diagnosis not present

## 2024-07-10 DIAGNOSIS — Z860100 Personal history of colon polyps, unspecified: Secondary | ICD-10-CM

## 2024-07-10 DIAGNOSIS — R195 Other fecal abnormalities: Secondary | ICD-10-CM | POA: Diagnosis not present

## 2024-07-10 MED ORDER — SODIUM CHLORIDE 0.9 % IV SOLN: 500.0000 mL | Freq: Once | INTRAVENOUS | Status: DC

## 2024-07-10 NOTE — Progress Notes (Signed)
 Expand All Collapse All       Ellouise Console, PA-C [address] [place], [place]  [address] Phone: [phone]     Primary Care Physician: Tisovec, Charlie ORN, MD   Primary Gastroenterologist:  Ellouise Console, PA-C / Norleen Kiang, MD    Chief Complaint: History of colon polyps.  Discussed repeat colonoscopy.        HPI:   Jason Norton is a 68 y.o. male, established patient Dr. Kiang, is referred by his PCP Dr. Tisovec to schedule repeat colonoscopy.  Patient had recent complete physical.  Screening Hemoccult test was positive.  Patient rarely sees mild bright red blood in his stool attributed to hemorrhoids.  Labs for the past few years have showed normal hemoglobin 16 g.  No anemia.  Patient denies abdominal pain, constipation, diarrhea, or significant rectal bleeding.  He has had mild intentional 20 pound weight loss in the past 6 months attributed to taking Ozempic .  He is not hungry since being on Ozempic .  He was desiring weight loss.  His hemoglobin A1c has improved to 5.5.   He has a history of a pulmonary embolism, which occurred over twenty years ago after extensive flight travel. He was previously on Coumadin  for ten years and now takes Eliquis .   He is adopted and is unsure of his family medical history.   06/2018 last colonoscopy by Dr. Kiang: 1 small 5 mm sessile serrated polyp removed.  Sigmoid diverticulosis.  Internal hemorrhoids.  Otherwise normal.  5-year repeat (due 06/2023).   PMH: History of pulmonary embolism currently on Eliquis , gout, arthritis, hypothyroidism, type 2 diabetes, hyperlipidemia, fatty liver, BPH.  Eliquis  is prescribed by his PCP Dr. Tisovec.  Patient denies shortness of breath or chest pain.           Current Outpatient Medications  Medication Sig Dispense Refill   allopurinol  (ZYLOPRIM ) 300 MG tablet Take 1 tablet (300 mg total) by mouth daily. 90 tablet 4   apixaban  (ELIQUIS ) 5 MG TABS tablet Take 1 tablet (5 mg total) by mouth 2 (two)  times daily. 180 tablet 4   Ascorbic Acid  (VITAMIN C ) 1000 MG tablet Take 1,000 mg by mouth in the morning.       Azelaic Acid  (FINACEA ) 15 % gel Apply 1 Application topically 2 (two) times daily. 150 g 3   Azelaic Acid  (FINACEA ) 15 % gel Apply 1 application  topically 2 (two) times daily. 150 g 3   celecoxib  (CELEBREX ) 200 MG capsule Take 1 capsule (200 mg total) by mouth daily as needed  FOR PAIN 90 capsule 3   cholecalciferol (VITAMIN D3) 25 MCG (1000 UNIT) tablet Take 1,000 Units by mouth in the morning.       colchicine  0.6 MG tablet Take 0.6 mg by mouth daily as needed (gout).        colchicine  0.6 MG tablet Take 1 tablet (0.6 mg total) by mouth daily as needed. 90 tablet 3   doxycycline  (VIBRAMYCIN ) 100 MG capsule Take 1 capsule (100 mg total) by mouth 2 (two) times daily. 180 capsule 1   finasteride  (PROSCAR ) 5 MG tablet Take 1 tablet (5 mg total) by mouth daily. 90 tablet 3   gabapentin  (NEURONTIN ) 300 MG capsule Take 1 capsule (300 mg total) by mouth 2 (two) times daily. 180 capsule 3   levothyroxine  (SYNTHROID ) 100 MCG tablet Take 1 tablet (100 mcg total) by mouth in the morning on an empty stomach, separate by 30 minutes from food  or calcium  products. 90 tablet 3   methocarbamol  (ROBAXIN ) 500 MG tablet Take 1 tablet (500 mg total) by mouth 2 (two) times daily as needed for muscle spasm 60 tablet 5   Na Sulfate-K Sulfate-Mg Sulfate concentrate (SUPREP) 17.5-3.13-1.6 GM/177ML SOLN Take 1 kit (354 mLs total) by mouth once for 1 dose. 354 mL 0   oxyCODONE  (OXY IR/ROXICODONE ) 5 MG immediate release tablet Take 1 tablet (5 mg total) by mouth every 8 (eight) hours as needed for moderate pain (pain score 4-6). 30 tablet 0   rosuvastatin  (CRESTOR ) 5 MG tablet Take 1 tablet (5 mg total) by mouth daily. 90 tablet 4   Semaglutide , 2 MG/DOSE, (OZEMPIC , 2 MG/DOSE,) 8 MG/3ML SOPN Inject 2 mg into the skin once a week. 9 mL 3   vitamin B-12 (CYANOCOBALAMIN ) 1000 MCG tablet Take 1,000 mcg by mouth in the  morning.       vitamin E 180 MG (400 UNITS) capsule Take 400 Units by mouth in the morning.       allopurinol  (ZYLOPRIM ) 300 MG tablet Take 1 tablet (300 mg total) by mouth daily. (Patient taking differently: Take 300 mg by mouth at bedtime.) 90 tablet 4   doxycycline  (VIBRAMYCIN ) 100 MG capsule Take 1 capsule (100 mg total) by mouth 2 (two) times daily. 180 capsule 1   levothyroxine  (SYNTHROID ) 100 MCG tablet Take 1 tablet (100 mcg total) by mouth in the morning on an empty stomach, separate by 30 minutes from food or calcium  products. 30 tablet 12   methocarbamol  (ROBAXIN ) 500 MG tablet Take 1 tablet (500 mg total) by mouth 2 (two) times daily as needed for muscle spasms (Patient taking differently: Take 500-1,000 mg by mouth every 4 (four) hours as needed (back spasms.).) 60 tablet 3   Semaglutide ,0.25 or 0.5MG /DOS, (OZEMPIC , 0.25 OR 0.5 MG/DOSE,) 2 MG/3ML SOPN Inject 0.25 mg into the skin once a week. 3 mL 5   Semaglutide -Weight Management (WEGOVY ) 2.4 MG/0.75ML SOAJ Inject 2.4 mg into the skin once a week. 3 mL 5   SYNTHROID  100 MCG tablet Take 1 tablet (100 mcg total) by mouth in the morning empty stomach, separate by 30 minutes from food or calcium  products. 30 tablet 12      No current facility-administered medications for this visit.             Allergies as of 06/27/2024 - Review Complete 06/27/2024  Allergen Reaction Noted   Sulfa antibiotics Other (See Comments) 11/10/2019          Past Medical History:  Diagnosis Date   Carpal tunnel syndrome     Clotting disorder     Diverticulosis     DJD (degenerative joint disease)     Gout     Hyperlipemia     Hypothyroidism     Meningitis      at age 39 weeks old   PE (pulmonary embolism) 09/12/2003    hx post freq travel-2005   Umbilical hernia                 Past Surgical History:  Procedure Laterality Date   BACK SURGERY        lumb lam   CARPAL TUNNEL RELEASE   08/27/2012    Procedure: CARPAL TUNNEL RELEASE;   Surgeon: Elsie Mussel, MD;  Location: [facility];  Service: Orthopedics;  Laterality: Right;  Right Limited Open Carpal Tunnel Release    COLONOSCOPY       EXCISION ORAL LESION WITH  CO2 LASER Right 06/26/2022    Procedure: ORAL BIOPSY WITH CO2 LASER ABLATION OF LESION;  Surgeon: Jesus Oliphant, MD;  Location: [facility];  Service: ENT;  Laterality: Right;   I & D EXTREMITY   09/07/2012    Procedure: IRRIGATION AND DEBRIDEMENT EXTREMITY;  Surgeon: Prentice LELON Pagan, MD;  Location: [facility] OR;  Service: Orthopedics;  Laterality: Right;   INSERTION OF MESH N/A 05/12/2015    Procedure: INSERTION OF MESH;  Surgeon: Lynda Leos, MD;  Location: [facility];  Service: General;  Laterality: N/A;   KNEE ARTHROPLASTY Right 09/11/1973    right knee reconstruction [id number]   KNEE ARTHROSCOPY Right      x3   REVERSE SHOULDER ARTHROPLASTY Right 01/24/2022    Procedure: RIGHT REVERSE SHOULDER ARTHROPLASTY;  Surgeon: Addie Cordella Hamilton, MD;  Location: [facility] OR;  Service: Orthopedics;  Laterality: Right;   STERIOD INJECTION   08/27/2012    Procedure: STEROID INJECTION;  Surgeon: Elsie Mussel, MD;  Location: [facility];  Service: Orthopedics;  Laterality: Left;   TOTAL HIP ARTHROPLASTY   09/11/2010    left   TOTAL KNEE ARTHROPLASTY   09/11/2005    rt   TOTAL KNEE REVISION   09/11/2009    rt   UMBILICAL HERNIA REPAIR N/A 05/12/2015    Procedure: LAPAROSCOPIC UMBILICAL HERNIA REPAIR;  Surgeon: Lynda Leos, MD;  Location: [facility];  Service: General;  Laterality: N/A;   WRIST ARTHROSCOPY   09/11/2004    debrid-rt          Review of Systems:    All systems reviewed and negative except where noted in HPI.      Physical Exam:  BP 126/76   Pulse 96   Ht 5' 9 (1.753 m)   Wt 193 lb (87.5 kg)   BMI 28.50 kg/m  No LMP for male patient.   General: Well-nourished, well-developed in no acute distress.  Lungs: Clear to auscultation bilaterally.  Non-labored. Heart: Regular rate and rhythm, no murmurs rubs or gallops.  Abdomen: Bowel sounds are normal; Abdomen is Soft; No hepatosplenomegaly, masses or hernias;  No Abdominal Tenderness; No guarding or rebound tenderness. Neuro: Alert and oriented x 3.  Grossly intact.  Psych: Alert and cooperative, normal mood and affect.      Imaging Studies:  Imaging Results  VAS US  CAROTID Result Date: 06/27/2024 Carotid Arterial Duplex Study Patient Name:  Jason Norton  Date of Exam:   06/27/2024 Medical Rec #: [id number]          Accession #:    [id number] Date of Birth: [birth year]          Patient Gender: M Patient Age:   37 years Exam Location:  [address] Procedure:      VAS US  CAROTID Referring Phys: CHARLIE TISOVEC --------------------------------------------------------------------------------  Indications: Bilateral bruits. Performing Technologist: [place] RVT  Examination Guidelines: A complete evaluation includes B-mode imaging, spectral Doppler, color Doppler, and power Doppler as needed of all accessible portions of each vessel. Bilateral testing is considered an integral part of a complete examination. Limited examinations for reoccurring indications may be performed as noted.  Right Carotid Findings: +----------+--------+--------+--------+------------------+--------+           PSV cm/sEDV cm/sStenosisPlaque DescriptionComments +----------+--------+--------+--------+------------------+--------+ CCA Prox  56      17                                         +----------+--------+--------+--------+------------------+--------+  CCA Mid   72      23                                         +----------+--------+--------+--------+------------------+--------+ CCA Distal82      28                                         +----------+--------+--------+--------+------------------+--------+ ICA Prox  56      20      Normal  heterogenous                +----------+--------+--------+--------+------------------+--------+ ICA Mid   40      20                                         +----------+--------+--------+--------+------------------+--------+ ICA Distal47      21                                         +----------+--------+--------+--------+------------------+--------+ ECA       70      19                                         +----------+--------+--------+--------+------------------+--------+ +----------+--------+-------+--------+-------------------+           PSV cm/sEDV cmsDescribeArm Pressure (mmHG) +----------+--------+-------+--------+-------------------+ [id number]      10                                 +----------+--------+-------+--------+-------------------+ +---------+--------+--+--------+--+ VertebralPSV cm/s47EDV cm/s13 +---------+--------+--+--------+--+  Left Carotid Findings: +----------+--------+--------+--------+------------------+--------+           PSV cm/sEDV cm/sStenosisPlaque DescriptionComments +----------+--------+--------+--------+------------------+--------+ CCA Prox  114     30                                         +----------+--------+--------+--------+------------------+--------+ CCA Mid   82      24                                         +----------+--------+--------+--------+------------------+--------+ CCA Distal80      26                                         +----------+--------+--------+--------+------------------+--------+ ICA Prox  56      15      Normal                             +----------+--------+--------+--------+------------------+--------+ ICA Mid   46      21                                         +----------+--------+--------+--------+------------------+--------+  ICA Distal31      14                                         +----------+--------+--------+--------+------------------+--------+ ECA       61      16               heterogenous               +----------+--------+--------+--------+------------------+--------+ +----------+--------+--------+--------+-------------------+           PSV cm/sEDV cm/sDescribeArm Pressure (mmHG) +----------+--------+--------+--------+-------------------+ Subclavian125     0                                   +----------+--------+--------+--------+-------------------+ +---------+--------+--+--------+--+ VertebralPSV cm/s52EDV cm/s14 +---------+--------+--+--------+--+   Summary: Right Carotid: There is no evidence of stenosis in the right ICA. Left Carotid: There is no evidence of stenosis in the left ICA. Vertebrals:  Bilateral vertebral arteries demonstrate antegrade flow. Subclavians: Normal flow hemodynamics were seen in bilateral subclavian              arteries. *See table(s) above for measurements and observations.  Electronically signed by Lonni Gaskins MD on 06/27/2024 at 3:13:17 PM.    Final     CT CARDIAC SCORING ([facility] LOCATIONS ONLY) Result Date: 06/26/2024 CLINICAL DATA:  68 year old white male. Encounter for general adult medical examination without abnormal findings. * Tracking Code: FCC * EXAM: CT CARDIAC CORONARY ARTERY CALCIUM  SCORE TECHNIQUE: Non-contrast imaging through the heart was performed using prospective ECG gating. Image post processing was performed on an independent workstation, allowing for quantitative analysis of the heart and coronary arteries. Note that this exam targets the heart and the chest was not imaged in its entirety. COMPARISON:  None Available. FINDINGS: CORONARY CALCIUM  SCORES: Left Main: 3.61 LAD: 254 LCx: 186 RCA: 129 Total Agatston Score: 572 MESA database percentile: 81 AORTA MEASUREMENTS: Ascending Aorta: 3.9 cm Descending Aorta: 2.4 cm OTHER FINDINGS: Aortic atherosclerotic calcifications. Heart size is normal. No significant pericardial effusion. Visualized mediastinal structures are normal. Images of the upper  abdomen are unremarkable. No airspace disease or consolidation in the visualized lungs. No acute bone abnormality. IMPRESSION: Coronary calcium  score is 572 and this is at percentile 81 for subjects of the same age, gender and ethnicity. Aortic Atherosclerosis (ICD10-I70.0). Electronically Signed   By: Juliene Balder M.D.   On: 06/26/2024 08:53       Labs: CBC Labs (Brief)          Component Value Date/Time    WBC 15.4 (H) 01/25/2022 0659    RBC 4.83 01/25/2022 0659    HGB 14.1 01/25/2022 0659    HCT 43.2 01/25/2022 0659    PLT 191 01/25/2022 0659    MCV 89.4 01/25/2022 0659    MCH 29.2 01/25/2022 0659    MCHC 32.6 01/25/2022 0659    RDW 15.0 01/25/2022 0659    LYMPHSABS 4.2 (H) 09/07/2012 1216    MONOABS 0.5 09/07/2012 1216    EOSABS 0.1 09/07/2012 1216    BASOSABS 0.0 09/07/2012 1216        CMP     Labs (Brief)          Component Value Date/Time    NA 139 01/18/2022 1426    NA 142 01/19/2020 1439    K 4.3 01/18/2022 1426  CL 107 01/18/2022 1426    CO2 23 01/18/2022 1426    GLUCOSE 93 01/18/2022 1426    BUN 13 01/18/2022 1426    BUN 18 01/19/2020 1439    CREATININE 1.05 01/18/2022 1426    CALCIUM  9.8 01/18/2022 1426    PROT 7.4 03/17/2011 1120    ALBUMIN 4.6 03/17/2011 1120    AST 15 03/17/2011 1120    ALT 21 03/17/2011 1120    ALKPHOS 115 03/17/2011 1120    BILITOT 0.6 03/17/2011 1120    GFRNONAA >60 01/18/2022 1426    GFRAA 93 01/19/2020 1439              Assessment and Plan:    Jason Norton is a 69 y.o. y/o male presents for:   1.  History of sessile serrated polyp  - Scheduling repeat surveillance colonoscopy I discussed risks of colonoscopy with patient to include risk of bleeding, colon perforation, and risk of sedation.  Patient expressed understanding and agrees to proceed with colonoscopy.    2.  Comorbidities: History of pulmonary embolism currently on Eliquis , gout, arthritis, hypothyroidism, type 2 diabetes, hyperlipidemia, fatty liver,  BPH - Requesting permission from Dr. Tisovec to hold Eliquis  2 days before colonoscopy.   3.  Recent positive screening Hemoccult test.  Most likely due to hemorrhoids.  He has no anemia; Hgb 16 g. - Scheduling updated colonoscopy.     Ellouise Console, PA-C

## 2024-07-10 NOTE — Patient Instructions (Addendum)
 Educational handout provided to patient related to Hemorrhoids, Polyps, and Diverticulosis  Resume previous diet  Continue present medications- RESUME ELIQUIS  IN 4 DAYS (MONDAY, NOVEMBER 3RD) AT PRIOR DOSE  Awaiting pathology results   YOU HAD AN ENDOSCOPIC PROCEDURE TODAY AT THE [facility]:   Refer to the procedure report that was given to you for any specific questions about what was found during the examination.  If the procedure report does not answer your questions, please call your gastroenterologist to clarify.  If you requested that your care partner not be given the details of your procedure findings, then the procedure report has been included in a sealed envelope for you to review at your convenience later.  YOU SHOULD EXPECT: Some feelings of bloating in the abdomen. Passage of more gas than usual.  Walking can help get rid of the air that was put into your GI tract during the procedure and reduce the bloating. If you had a lower endoscopy (such as a colonoscopy or flexible sigmoidoscopy) you may notice spotting of blood in your stool or on the toilet paper. If you underwent a bowel prep for your procedure, you may not have a normal bowel movement for a few days.  Please Note:  You might notice some irritation and congestion in your nose or some drainage.  This is from the oxygen used during your procedure.  There is no need for concern and it should clear up in a day or so.  SYMPTOMS TO REPORT IMMEDIATELY:  Following lower endoscopy (colonoscopy or flexible sigmoidoscopy):  Excessive amounts of blood in the stool  Significant tenderness or worsening of abdominal pains  Swelling of the abdomen that is new, acute  Fever of 100F or higher  For urgent or emergent issues, a gastroenterologist can be reached at any hour by calling [phone]. Do not use MyChart messaging for urgent concerns.    DIET:  We do recommend a small meal at first, but then you may proceed  to your regular diet.  Drink plenty of fluids but you should avoid alcoholic beverages for 24 hours.  ACTIVITY:  You should plan to take it easy for the rest of today and you should NOT DRIVE or use heavy machinery until tomorrow (because of the sedation medicines used during the test).    FOLLOW UP: Our staff will call the number listed on your records the next business day following your procedure.  We will call around 7:15- 8:00 am to check on you and address any questions or concerns that you may have regarding the information given to you following your procedure. If we do not reach you, we will leave a message.     If any biopsies were taken you will be contacted by phone or by letter within the next 1-3 weeks.  Please call us  at [phone] if you have not heard about the biopsies in 3 weeks.    SIGNATURES/CONFIDENTIALITY: You and/or your care partner have signed paperwork which will be entered into your electronic medical record.  These signatures attest to the fact that that the information above on your After Visit Summary has been reviewed and is understood.  Full responsibility of the confidentiality of this discharge information lies with you and/or your care-partner.

## 2024-07-10 NOTE — Progress Notes (Signed)
 Patient reports pain 9 out of 10. Levsin,2 tablets given ,and simethicone, 5 ml given, given.  Pt is passing gas. Dr. Abran made aware. Patient up walking and passed more gas.  Patient is reporting pain at 3 out of 10.

## 2024-07-10 NOTE — Progress Notes (Signed)
 Vss nad trans to pacu

## 2024-07-10 NOTE — Op Note (Signed)
 [facility] Patient Name: Jason Norton Procedure Date: 07/10/2024 12:43 PM MRN: [id number] Endoscopist: Norleen SAILOR. Abran , MD, [id number] Age: 68 Referring MD:  Date of Birth: July 18, 1956 Gender: Male Account #: [id number] Procedure:                Colonoscopy with cold snare x 1; EMR x 1;                            submucosal tattoo Indications:              High risk colon cancer surveillance: Personal                            history of non-advanced adenoma, High risk colon                            cancer surveillance: Personal history of sessile                            serrated colon polyp (less than 10 mm in size) with                            no dysplasia Medicines:                Monitored Anesthesia Care Procedure:                Pre-Anesthesia Assessment:                           - Prior to the procedure, a History and Physical                            was performed, and patient medications and                            allergies were reviewed. The patient's tolerance of                            previous anesthesia was also reviewed. The risks                            and benefits of the procedure and the sedation                            options and risks were discussed with the patient.                            All questions were answered, and informed consent                            was obtained. Prior Anticoagulants: The patient has                            taken Eliquis  (apixaban ), last dose was 2 days  prior to procedure. ASA Grade Assessment: III - A                            patient with severe systemic disease. After                            reviewing the risks and benefits, the patient was                            deemed in satisfactory condition to undergo the                            procedure.                           After obtaining informed consent, the colonoscope                            was  passed under direct vision. Throughout the                            procedure, the patient's blood pressure, pulse, and                            oxygen saturations were monitored continuously. The                            Olympus Scope SN [id number] was introduced through the                            anus and advanced to the the cecum, identified by                            appendiceal orifice and ileocecal valve. The                            ileocecal valve, appendiceal orifice, and rectum                            were photographed. The quality of the bowel                            preparation was excellent. The colonoscopy was                            performed without difficulty. The patient tolerated                            the procedure well. The bowel preparation used was                            SUPREP via split dose instruction. Scope In: 1:48:41 PM Scope Out: 2:15:30 PM Scope Withdrawal Time: 0 hours 24 minutes 19 seconds  Total Procedure Duration: 0 hours  26 minutes 49 seconds  Findings:                 A 4 mm polyp was found in the ascending colon. The                            polyp was removed with a cold snare. Resection and                            retrieval were complete.                           A 15 mm polyp was found in the transverse colon.                            The polyp was semi-sessile. The polyp was removed                            with endoscopic mucosal resection (EMR)                            technique?"?"a saline injection-lift technique using                            a hot snare. This was removed in 1 piece.                            Fragmented for retrieval. Resection and retrieval                            were complete. In a separate maneuver, carbon black                            tattoo ink was injected submucosally distal to the                            lesion for future identification purposes                            Multiple diverticula were found in the left colon.                           Internal hemorrhoids were found during retroflexion.                           The exam was otherwise without abnormality on                            direct and retroflexion views. Complications:            No immediate complications. Estimated blood loss:                            None. Estimated Blood Loss:     Estimated blood loss: none. Impression:               -  One 4 mm polyp in the ascending colon, removed                            with a cold snare. Resected and retrieved.                           - One 15 mm polyp in the transverse colon, removed                            using EMR technique?"injection-lift and a hot snare.                            Resected and retrieved. Downstream tattoo.                           - Diverticulosis in the left colon.                           - Internal hemorrhoids.                           - The examination was otherwise normal on direct                            and retroflexion views. Recommendation:           - Repeat colonoscopy in 3 years for surveillance.                           - Resume Eliquis  (apixaban ) in 4 days (Monday,                            November 3) at prior dose.                           - Patient has a contact number available for                            emergencies. The signs and symptoms of potential                            delayed complications were discussed with the                            patient. Return to normal activities tomorrow.                            Written discharge instructions were provided to the                            patient.                           - Resume previous diet.                           -  Continue present medications.                           - Await pathology results. Norleen SAILOR. Abran, MD 07/10/2024 2:34:20 PM This report has been signed electronically.

## 2024-07-10 NOTE — Progress Notes (Signed)
 Called to room to assist during endoscopic procedure.  Patient ID and intended procedure confirmed with present staff. Received instructions for my participation in the procedure from the performing physician.

## 2024-07-11 ENCOUNTER — Telehealth: Payer: Self-pay | Admitting: *Deleted

## 2024-07-11 NOTE — Telephone Encounter (Signed)
 No answer post call. Left a message.

## 2024-07-15 ENCOUNTER — Ambulatory Visit: Payer: Self-pay | Admitting: Internal Medicine

## 2024-07-15 LAB — SURGICAL PATHOLOGY

## 2024-08-01 ENCOUNTER — Other Ambulatory Visit: Payer: Self-pay

## 2024-08-01 ENCOUNTER — Other Ambulatory Visit (HOSPITAL_COMMUNITY): Payer: Self-pay

## 2024-08-05 ENCOUNTER — Other Ambulatory Visit: Payer: Self-pay

## 2024-08-26 ENCOUNTER — Other Ambulatory Visit (HOSPITAL_COMMUNITY): Payer: Self-pay

## 2024-09-13 ENCOUNTER — Other Ambulatory Visit (HOSPITAL_BASED_OUTPATIENT_CLINIC_OR_DEPARTMENT_OTHER): Payer: Self-pay

## 2024-09-15 ENCOUNTER — Other Ambulatory Visit (HOSPITAL_COMMUNITY): Payer: Self-pay

## 2024-09-16 ENCOUNTER — Other Ambulatory Visit (HOSPITAL_COMMUNITY): Payer: Self-pay

## 2024-09-18 ENCOUNTER — Other Ambulatory Visit (HOSPITAL_COMMUNITY): Payer: Self-pay

## 2024-09-23 ENCOUNTER — Other Ambulatory Visit (HOSPITAL_COMMUNITY): Payer: Self-pay

## 2024-09-24 ENCOUNTER — Other Ambulatory Visit (HOSPITAL_COMMUNITY): Payer: Self-pay

## 2024-09-29 ENCOUNTER — Other Ambulatory Visit (HOSPITAL_COMMUNITY): Payer: Self-pay

## 2024-09-30 ENCOUNTER — Other Ambulatory Visit (HOSPITAL_BASED_OUTPATIENT_CLINIC_OR_DEPARTMENT_OTHER): Payer: Self-pay

## 2024-09-30 ENCOUNTER — Other Ambulatory Visit (HOSPITAL_COMMUNITY): Payer: Self-pay

## 2024-09-30 MED ORDER — LEVOTHYROXINE SODIUM 100 MCG PO TABS: 100.0000 ug | ORAL_TABLET | Freq: Every morning | ORAL | 3 refills | Status: AC

## 2024-10-01 ENCOUNTER — Other Ambulatory Visit (HOSPITAL_COMMUNITY): Payer: Self-pay
